# Patient Record
Sex: Female | Born: 1975 | Race: White | Hispanic: No | Marital: Married | Smoking: Never smoker
Health system: Southern US, Community
[De-identification: ages and names within clinical notes are randomized; demographics above are authoritative.]

## PROBLEM LIST (undated history)

## (undated) DIAGNOSIS — Z8051 Family history of malignant neoplasm of kidney: Secondary | ICD-10-CM

## (undated) DIAGNOSIS — K5190 Ulcerative colitis, unspecified, without complications: Secondary | ICD-10-CM

## (undated) DIAGNOSIS — F329 Major depressive disorder, single episode, unspecified: Secondary | ICD-10-CM

## (undated) DIAGNOSIS — N390 Urinary tract infection, site not specified: Secondary | ICD-10-CM

## (undated) DIAGNOSIS — T7840XA Allergy, unspecified, initial encounter: Secondary | ICD-10-CM

## (undated) DIAGNOSIS — F419 Anxiety disorder, unspecified: Secondary | ICD-10-CM

## (undated) DIAGNOSIS — D649 Anemia, unspecified: Secondary | ICD-10-CM

## (undated) DIAGNOSIS — Z8041 Family history of malignant neoplasm of ovary: Secondary | ICD-10-CM

## (undated) DIAGNOSIS — Z808 Family history of malignant neoplasm of other organs or systems: Secondary | ICD-10-CM

## (undated) DIAGNOSIS — E785 Hyperlipidemia, unspecified: Secondary | ICD-10-CM

## (undated) DIAGNOSIS — R519 Headache, unspecified: Secondary | ICD-10-CM

## (undated) DIAGNOSIS — R51 Headache: Secondary | ICD-10-CM

## (undated) DIAGNOSIS — F32A Depression, unspecified: Secondary | ICD-10-CM

## (undated) DIAGNOSIS — Z8489 Family history of other specified conditions: Secondary | ICD-10-CM

## (undated) DIAGNOSIS — Z803 Family history of malignant neoplasm of breast: Secondary | ICD-10-CM

## (undated) DIAGNOSIS — B019 Varicella without complication: Secondary | ICD-10-CM

## (undated) HISTORY — DX: Varicella without complication: B01.9

## (undated) HISTORY — DX: Family history of malignant neoplasm of ovary: Z80.41

## (undated) HISTORY — PX: CHOLECYSTECTOMY: SHX55

## (undated) HISTORY — DX: Anxiety disorder, unspecified: F41.9

## (undated) HISTORY — DX: Allergy, unspecified, initial encounter: T78.40XA

## (undated) HISTORY — DX: Major depressive disorder, single episode, unspecified: F32.9

## (undated) HISTORY — DX: Anemia, unspecified: D64.9

## (undated) HISTORY — DX: Depression, unspecified: F32.A

## (undated) HISTORY — DX: Hyperlipidemia, unspecified: E78.5

## (undated) HISTORY — DX: Family history of malignant neoplasm of breast: Z80.3

## (undated) HISTORY — DX: Family history of malignant neoplasm of other organs or systems: Z80.8

## (undated) HISTORY — PX: OTHER SURGICAL HISTORY: SHX169

## (undated) HISTORY — DX: Ulcerative colitis, unspecified, without complications: K51.90

## (undated) HISTORY — DX: Family history of malignant neoplasm of kidney: Z80.51

## (undated) HISTORY — DX: Urinary tract infection, site not specified: N39.0

## (undated) HISTORY — PX: SMALL INTESTINE SURGERY: SHX150

---

## 2004-09-17 HISTORY — PX: ABDOMINAL HYSTERECTOMY: SHX81

## 2011-04-18 ENCOUNTER — Encounter: Payer: Self-pay | Admitting: Family Medicine

## 2011-04-18 ENCOUNTER — Ambulatory Visit (INDEPENDENT_AMBULATORY_CARE_PROVIDER_SITE_OTHER): Payer: BC Managed Care – PPO | Admitting: Family Medicine

## 2011-04-18 VITALS — BP 130/78 | HR 72 | Temp 98.3°F | Resp 12 | Ht 64.5 in | Wt 254.0 lb

## 2011-04-18 DIAGNOSIS — E785 Hyperlipidemia, unspecified: Secondary | ICD-10-CM | POA: Insufficient documentation

## 2011-04-18 DIAGNOSIS — R609 Edema, unspecified: Secondary | ICD-10-CM

## 2011-04-18 DIAGNOSIS — F339 Major depressive disorder, recurrent, unspecified: Secondary | ICD-10-CM | POA: Insufficient documentation

## 2011-04-18 DIAGNOSIS — R600 Localized edema: Secondary | ICD-10-CM

## 2011-04-18 DIAGNOSIS — F329 Major depressive disorder, single episode, unspecified: Secondary | ICD-10-CM

## 2011-04-18 DIAGNOSIS — E669 Obesity, unspecified: Secondary | ICD-10-CM | POA: Insufficient documentation

## 2011-04-18 NOTE — Patient Instructions (Signed)
Work on weight loss Reduce sugars and starches. Consider omega 3 supplement such as Flaxseed or fish oil 2-3 gm daily.

## 2011-04-18 NOTE — Progress Notes (Signed)
  Subjective:    Patient ID: Catherine Washington, female    DOB: [birth year], 35 y.o.   MRN: [id number]  HPI New patient to establish care. Recently seen by a gynecologist and had lab work significant for elevated triglycerides 257 with mildly elevated LDL 113 and HDL 40. Past history reviewed. She's had remote history of depression but not currently treated. She's had previous hysterectomy 2006 secondary to menorrhagia. No regular medications. No known drug allergies.  Family history significant for father having type 2 diabetes heart disease with MI age 69 and hypertension. Both parents had elevated lipids.  Patient nonsmoker. She is married with 2 children. 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 No alcohol use  She's had some mild pitting edema lower legs for the past year. Worse late in the day. No dyspnea. No nonsteroidals. Does not use any compressive support garments   Review of Systems  Constitutional: Negative for chills, activity change, appetite change, fatigue and unexpected weight change.  Eyes: Negative for visual disturbance.  Respiratory: Negative for cough and shortness of breath.   Cardiovascular: Negative for chest pain.  Gastrointestinal: Negative for abdominal pain.  Genitourinary: Negative for dysuria.  Neurological: Negative for dizziness and headaches.  Hematological: Negative for adenopathy.  Psychiatric/Behavioral: Negative for dysphoric mood.       Objective:   Physical Exam  Constitutional: She is oriented to person, place, and time. She appears well-developed and well-nourished. No distress.  Neck: Neck supple. No thyromegaly present.  Cardiovascular: Normal rate, regular rhythm and normal heart sounds.   No murmur heard. Pulmonary/Chest: Effort normal and breath sounds normal. No respiratory distress. She has no wheezes. She has no rales.  Musculoskeletal: She exhibits edema.       Patient has trace pitting edema both legs  Lymphadenopathy:    She has no  cervical adenopathy.  Neurological: She is alert and oriented to person, place, and time.          Assessment & Plan:  #1 dyslipidemia with high triglyceride low HDL. Discussed weight loss, initiating exercise, sugar and starch reduction and consideration for omega-3 supplement. Consider repeat lipids in 6-12 months #2 past history of depression currently stable #3 mild pitting edema most likely venous stasis. Recent thyroid functions normal. Recent renal function normal. Consider venous compression garments and weight loss

## 2011-09-14 ENCOUNTER — Ambulatory Visit (INDEPENDENT_AMBULATORY_CARE_PROVIDER_SITE_OTHER): Payer: BC Managed Care – PPO | Admitting: *Deleted

## 2011-09-14 DIAGNOSIS — Z23 Encounter for immunization: Secondary | ICD-10-CM

## 2012-01-03 ENCOUNTER — Ambulatory Visit (INDEPENDENT_AMBULATORY_CARE_PROVIDER_SITE_OTHER): Payer: BC Managed Care – PPO | Admitting: Family Medicine

## 2012-01-03 ENCOUNTER — Encounter: Payer: Self-pay | Admitting: Family Medicine

## 2012-01-03 VITALS — BP 130/90 | Temp 98.3°F | Wt 246.0 lb

## 2012-01-03 DIAGNOSIS — IMO0002 Reserved for concepts with insufficient information to code with codable children: Secondary | ICD-10-CM

## 2012-01-03 DIAGNOSIS — Z23 Encounter for immunization: Secondary | ICD-10-CM

## 2012-01-03 MED ORDER — DOXYCYCLINE HYCLATE 100 MG PO TABS: 100.0000 mg | ORAL_TABLET | Freq: Two times a day (BID) | ORAL | Status: AC

## 2012-01-03 NOTE — Patient Instructions (Signed)
Paronychia  Paronychia is an inflammatory reaction involving the folds of the skin surrounding the fingernail. This is commonly caused by an infection in the skin around a nail. The most common cause of paronychia is frequent wetting of the hands (as seen with bartenders, food servers, nurses or others who wet their hands). This makes the skin around the fingernail susceptible to infection by bacteria (germs) or fungus. Other predisposing factors are:   Aggressive manicuring.   Nail biting.   Thumb sucking.  The most common cause is a staphylococcal (a type of germ) infection, or a fungal (Candida) infection. When caused by a germ, it usually comes on suddenly with redness, swelling, pus and is often painful. It may get under the nail and form an abscess (collection of pus), or form an abscess around the nail. If the nail itself is infected with a fungus, the treatment is usually prolonged and may require oral medicine for up to one year. Your caregiver will determine the length of time treatment is required. The paronychia caused by bacteria (germs) may largely be avoided by not pulling on hangnails or picking at cuticles. When the infection occurs at the tips of the finger it is called felon. When the cause of paronychia is from the herpes simplex virus (HSV) it is called herpetic whitlow.  TREATMENT   When an abscess is present treatment is often incision and drainage. This means that the abscess must be cut open so the pus can get out. When this is done, the following home care instructions should be followed.  HOME CARE INSTRUCTIONS    It is important to keep the affected fingers very dry. Rubber or plastic gloves over cotton gloves should be used whenever the hand must be placed in water.   Keep wound clean, dry and dressed as suggested by your caregiver between warm soaks or warm compresses.   Soak in warm water for fifteen to twenty minutes three to four times per day for bacterial infections. Fungal  infections are very difficult to treat, so often require treatment for long periods of time.   For bacterial (germ) infections take antibiotics (medicine which kill germs) as directed and finish the prescription, even if the problem appears to be solved before the medicine is gone.   Only take over-the-counter or prescription medicines for pain, discomfort, or fever as directed by your caregiver.  SEEK IMMEDIATE MEDICAL CARE IF:   You have redness, swelling, or increasing pain in the wound.   You notice pus coming from the wound.   You have a fever.   You notice a bad smell coming from the wound or dressing.  Document Released: 02/27/2001 Document Revised: 08/23/2011 Document Reviewed: 10/29/2008  [employer] Patient Information 2012 [employer].

## 2012-01-03 NOTE — Progress Notes (Signed)
  Subjective:    Patient ID: Catherine Washington, female    DOB: Nov 26, 1975, 36 y.o.   MRN: [id number]  HPI  Acute visit. Possible hangnail left middle finger last week. Over the weekend progresses swelling and some puslike drainage. Also noted some granulation tissue forming since then. She denies any fever or chills.  She does not use any artificial nails. No history of diabetes. Last tetanus unknown but she initially declines tetanus today. No history of MRSA. Allergy to Macrobid. No history of diabetes.   Review of Systems  Constitutional: Negative for fever and chills.       Objective:   Physical Exam  Constitutional: She appears well-developed and well-nourished.  Musculoskeletal:       Left middle finger reveals erythema mostly along the border next the ring finger. She has some granulation tissue along that border the nail. No obvious purulence but she does have some minimal whitish discoloration of tissue possibly related to pressure versus pus. There is no involvement of the DIP joint or PIP joint and no erythema spreading to the DIP joint. Moderately tender to palpation. Full range of motion PIP and DIP joint. No evidence for felon          Assessment & Plan:  Paronychia left middle finger. We've recommended I and D. and after reviewing risks and benefits the patient consents.  Digital block with 1% plain xylocaine.  Prepped with betadine.  Using #11 blade made small 1/2 cm incision area of fluctuance with minimal pus and this was cultured.  Minimal bleeding.  Topical antibiotic and dressing.  Start salt water soaks by tonight.  Doxycycline 100 mg po bid for 10 days. Pt did consent to tetanus.

## 2012-01-04 ENCOUNTER — Telehealth: Payer: Self-pay | Admitting: Family Medicine

## 2012-01-04 NOTE — Telephone Encounter (Signed)
I called the lab, they have what they need after all.

## 2012-01-04 NOTE — Telephone Encounter (Signed)
[facility] only rcvd a face sheet. Need to get a requisition. Pls fax to fax # [phone].

## 2012-06-23 ENCOUNTER — Ambulatory Visit (INDEPENDENT_AMBULATORY_CARE_PROVIDER_SITE_OTHER): Payer: BC Managed Care – PPO | Admitting: Internal Medicine

## 2012-06-23 ENCOUNTER — Encounter: Payer: Self-pay | Admitting: Internal Medicine

## 2012-06-23 ENCOUNTER — Telehealth: Payer: Self-pay | Admitting: Family Medicine

## 2012-06-23 VITALS — BP 130/84 | HR 98 | Temp 98.6°F | Wt 236.0 lb

## 2012-06-23 DIAGNOSIS — R52 Pain, unspecified: Secondary | ICD-10-CM

## 2012-06-23 DIAGNOSIS — R109 Unspecified abdominal pain: Secondary | ICD-10-CM

## 2012-06-23 DIAGNOSIS — R1110 Vomiting, unspecified: Secondary | ICD-10-CM

## 2012-06-23 DIAGNOSIS — R1013 Epigastric pain: Secondary | ICD-10-CM

## 2012-06-23 LAB — POCT URINALYSIS DIPSTICK
Bilirubin, UA: NEGATIVE
Blood, UA: NEGATIVE
Glucose, UA: NEGATIVE
Spec Grav, UA: <=1.005

## 2012-06-23 MED ORDER — ONDANSETRON 4 MG PO TBDP: 4.0000 mg | ORAL_TABLET | Freq: Three times a day (TID) | ORAL | Status: DC | PRN

## 2012-06-23 MED ORDER — PROMETHAZINE HCL 25 MG/ML IJ SOLN
25.0000 mg | Freq: Once | INTRAMUSCULAR | Status: AC
  Administered 2012-06-23: 25 mg via INTRAVENOUS

## 2012-06-23 NOTE — Telephone Encounter (Signed)
Patient calling.  She started with  severe upper abdominal pain, radiating into her back and chest area.  Started vomiting at 1030.   She rated the pain 7/10.  Took OTC meds with minimal relief.   Sx started with a fullness in her abdominal area on the way home this morning and by the time she got home she was in pain.  No fever.   LMP -hysterectomy at age 36.   Needs to be seen immediately per Abdominal Pain protocal.  Scheduled for 330p with Dr. Fabian Sharp.

## 2012-06-23 NOTE — Progress Notes (Signed)
  Subjective:    Patient ID: Catherine Washington, female    DOB: 1976-05-02, 36 y.o.   MRN: [id number]  HPI Patient comes in today for SDA for  new problem evaluation. pcp NA here with husband driver Was in her usual state of health until about 6-8 hours ago when she had the acute onset of high epigastric pain that radiated to her back and some up into her chest. This then began to be associated with vomiting and dry heaves.  She took famotidine Pepto-Bismol Maalox other over-the-counter antacids without significant help. Since that time it has not improved she calls it a 7 out of10 pain  Level. The pain is constant but worse after trying to eat. Denies any respiratory symptoms worse with inspiration change in bowel habits otherwise blood in the stool. She has a history of number weeks ago of a mild symptom like this the responded to famotidine over-the-counter. But not this bad. She's had a hysterectomy. She's on no medicines chronically for chronic disease state. No history of kidney stones diagnosed gallbladder disease problem with pregnancy except she vomited through the pregnancies 15 years ago. There is a family history of ulcer disease in the father and he also had gallbladder disease. Review of Systems Negative for chest pain shortness of breath syncope fever chills UTI symptoms hematuria. No significant recent Advil Aleve for pain medicines gets occasional hip pain. No hx of sig etoh.  Past history family history social history reviewed in the electronic medical record.    Objective:   Physical Exam BP 130/84  Pulse 98  Temp 98.6 F (37 C) (Oral)  Wt 236 lb (107.049 kg)  SpO2 98%  WDWN in nad while sitting serious  affect non toxic. Quiet breathing. No diaphoresis Neck: Supple without adenopathy or masses or bruits Chest:  Clear to A&P without wheezes rales or rhonchi CV:  S1-S2 no gallops or murmurs peripheral perfusion is normal Abdomen:  BS present  without hepatosplenomegaly,  no  CVA tenderness  Tender RUQ on palpation no rebound also epigastrium no rebound .  No lower q pain  No clubbing cyanosis or edema Skin colo non icteric  UA normal     Assessment & Plan:  Acute epigastric right upper  Pain  with nausea and vomiting. Radiation to the back.  Differential diagnosis discussed suspect biliary disease because of the right upper quadrant tenderness on exam. However still could have acid peptic disease also.  She has no other systemic symptoms it is the end of the clinic today and unable to get an emergency ultrasound. Discussed hydration is important she may end up needing to be seen in the emergency room tonight. IM Phenergan 25 mg samples of Prilosec to take 1 by mouth daily as well as Zofran as needed for nausea. Stay on clear liquids we will order ultrasound for tomorrow.  And also lab work at that time to avoid repetition   if she ends up having to go to the emergency room tonight.   Discussed situation with her primary care doctor today.

## 2012-06-23 NOTE — Patient Instructions (Addendum)
I suspect this is possible gallbladder pain and if you're unable to tolerate liquids and the pain did not subside I recommend emergency room care and evaluation. Ulcer acid disease is also possible but usually doesn't come on this quickly.   In the meantime we will order an abdominal ultrasound for as soon as possible and plan lab work also.  Clear liquids only add anti-acid medication as given.  Can try Zofran for nausea but this won't really help pain.

## 2012-06-24 ENCOUNTER — Other Ambulatory Visit: Payer: Self-pay | Admitting: Family Medicine

## 2012-06-24 ENCOUNTER — Telehealth: Payer: Self-pay | Admitting: Family Medicine

## 2012-06-24 ENCOUNTER — Other Ambulatory Visit (INDEPENDENT_AMBULATORY_CARE_PROVIDER_SITE_OTHER): Payer: BC Managed Care – PPO

## 2012-06-24 ENCOUNTER — Ambulatory Visit
Admission: RE | Admit: 2012-06-24 | Discharge: 2012-06-24 | Disposition: A | Discharge status: Home / Self Care | Payer: BC Managed Care – PPO | Source: Ambulatory Visit | Attending: Internal Medicine | Admitting: Internal Medicine

## 2012-06-24 DIAGNOSIS — R52 Pain, unspecified: Secondary | ICD-10-CM

## 2012-06-24 DIAGNOSIS — R1013 Epigastric pain: Secondary | ICD-10-CM

## 2012-06-24 DIAGNOSIS — R1110 Vomiting, unspecified: Secondary | ICD-10-CM

## 2012-06-24 DIAGNOSIS — K819 Cholecystitis, unspecified: Secondary | ICD-10-CM

## 2012-06-24 DIAGNOSIS — R109 Unspecified abdominal pain: Secondary | ICD-10-CM

## 2012-06-24 DIAGNOSIS — K8020 Calculus of gallbladder without cholecystitis without obstruction: Secondary | ICD-10-CM

## 2012-06-24 LAB — CBC WITH DIFFERENTIAL/PLATELET
Basophils Relative: 0.5 % (ref 0.0–3.0)
Eosinophils Absolute: 0.3 10*3/uL (ref 0.0–0.7)
HCT: 38.8 % (ref 36.0–46.0)
Lymphs Abs: 2.9 10*3/uL (ref 0.7–4.0)
MCHC: 32.5 g/dL (ref 30.0–36.0)
MCV: 84.6 fl (ref 78.0–100.0)
Monocytes Absolute: 0.6 10*3/uL (ref 0.1–1.0)
Neutrophils Relative %: 55.5 % (ref 43.0–77.0)
RBC: 4.58 Mil/uL (ref 3.87–5.11)

## 2012-06-24 LAB — BASIC METABOLIC PANEL
Calcium: 9 mg/dL (ref 8.4–10.5)
GFR: 80.52 mL/min (ref >60.00)
Glucose, Bld: 88 mg/dL (ref 70–99)
Potassium: 4.1 mEq/L (ref 3.5–5.1)
Sodium: 139 mEq/L (ref 135–145)

## 2012-06-24 LAB — HEPATIC FUNCTION PANEL
ALT: 15 U/L (ref 0–35)
AST: 17 U/L (ref 0–37)
Albumin: 3.9 g/dL (ref 3.5–5.2)
Total Bilirubin: 0.7 mg/dL (ref 0.3–1.2)
Total Protein: 7.4 g/dL (ref 6.0–8.3)

## 2012-06-24 LAB — AMYLASE: Amylase: 46 U/L (ref 27–131)

## 2012-06-24 NOTE — Telephone Encounter (Signed)
Spoke to the pt and informed her of surgery referral.

## 2012-06-24 NOTE — Telephone Encounter (Signed)
Do you have any further suggestions?  Please advise.  Thanks!!

## 2012-06-24 NOTE — Telephone Encounter (Signed)
Pt had abdominal ultrasound.  Results are contracted gallbladder with no mobile stones in the neck and fundus marked with wall thickening compatible with cholecystitis.  Likely chronic given the lack of sonographic murphy sign.  Dr. Fabian Sharp has made referral for surgery.  I have placed the order in the system.  I have tried reaching the pt by telephone.  Left a message for her to return my call.

## 2012-06-24 NOTE — Telephone Encounter (Signed)
No. Needs to see surgeon as soon as possible.  We will need to call if she does not get appt in next 1-2 days.

## 2012-06-25 ENCOUNTER — Telehealth (INDEPENDENT_AMBULATORY_CARE_PROVIDER_SITE_OTHER): Payer: Self-pay

## 2012-06-25 NOTE — Telephone Encounter (Signed)
Please see if you can call to make appt in next few days per Dr. Caryl Never.  He would like her there in 1-2.

## 2012-06-25 NOTE — Telephone Encounter (Signed)
The patient called to see if she can get her appointment moved up.  She is having some pain and nausea.  I looked at everyone's schedule and I don't see an appointment sooner than her appointment on 10/15.  I advised to stay on a bland, low fat diet and call back in the morning so we can look for cancellations.

## 2012-06-26 ENCOUNTER — Telehealth (INDEPENDENT_AMBULATORY_CARE_PROVIDER_SITE_OTHER): Payer: Self-pay

## 2012-06-26 NOTE — Telephone Encounter (Signed)
Received a call from Dr Caryl Never re: pts appt and concern with weekend coming. He states his office was told it may be a couple of months before pt could be seen. I looked at pts appt in system and  pts appt is on 07-01-12.  I advised him I will Have MD on call review pts labs and u/s result for recommendations. Labs and U/S reviewed by Dr. Abbey Chatters. Per Dr. Abbey Chatters labs are good and u/s shows chronic problem. Does not appear to be acute.  He asked me to call pt and if symptoms resolving an appt for 07-01-12 should be acceptable. I called pt and she states she is improving. Pt advised to eat low fat low residue diet until we see her Tuesday. Pt also advised if she has increased abd pain,fever and or uncontrolled vomiting she is to to to ER. Pt states she understands and this plan is acceptable to her.

## 2012-06-26 NOTE — Telephone Encounter (Signed)
Called [facility] and spoke to triage nurse told me a lot of the doctors are out and did not see any openings but is going to have her site manager look over the schedule and call me back.

## 2012-07-01 ENCOUNTER — Ambulatory Visit (INDEPENDENT_AMBULATORY_CARE_PROVIDER_SITE_OTHER): Payer: BC Managed Care – PPO | Admitting: General Surgery

## 2012-07-01 ENCOUNTER — Encounter (INDEPENDENT_AMBULATORY_CARE_PROVIDER_SITE_OTHER): Payer: Self-pay | Admitting: General Surgery

## 2012-07-01 VITALS — BP 130/82 | HR 79 | Temp 98.0°F | Resp 18 | Ht 64.0 in | Wt 232.2 lb

## 2012-07-01 DIAGNOSIS — K8066 Calculus of gallbladder and bile duct with acute and chronic cholecystitis without obstruction: Secondary | ICD-10-CM

## 2012-07-01 DIAGNOSIS — K8010 Calculus of gallbladder with chronic cholecystitis without obstruction: Secondary | ICD-10-CM | POA: Insufficient documentation

## 2012-07-01 NOTE — Progress Notes (Signed)
Patient ID: Catherine Washington, female   DOB: 07/02/1976, 36 y.o.   MRN: [id number]  Chief Complaint  Patient presents with  . New Evaluation    Chole    HPI Catherine Washington is a 36 y.o. female.  She is referred by Dr. Berniece Andreas for evaluation of symptomatic gallstones. Dr. Evelena Peat is her primary care physician.  Over the past 2 months this woman has had 3 episodes of postprandial burning epigastric pain radiating to her back. With each episode she has nausea and vomiting. The first episode lasted about 3 hours but the last episode lasted 12 hours and she went for evaluation. No alteration in bowel movements. No fever or chills. No past history of liver disease, cardiovascular disease or pulmonary problems.  Gallbladder ultrasound on 06/24/2012 shows a contracted gallbladder with non-mobile stones, significant wall thickening but no Murphy's sign. Lab work including CBC, complete metabolic panel, and lipase are normal.  Her father had his gallbladder out.  Patient's past medical history is significant for obesity, depression, hyperlipidemia, and a supracervical laparoscopic hysterectomy. Both ovaries remain in place.  She is a Nurse, learning disability at [employer]. Her husband is a Doctor, hospital G. Has 2 sons. Denies tobacco or alcohol. HPI  Past Medical History  Diagnosis Date  . Chicken pox   . Depression   . Allergy   . Hyperlipidemia   . UTI (urinary tract infection)     Past Surgical History  Procedure Date  . Abdominal hysterectomy 2006  . Childbirth 1995, 2003    Family History  Problem Relation Age of Onset  . Hyperlipidemia Mother   . Mental illness Mother   . Hyperlipidemia Father   . Heart disease Father   . Hypertension Father   . Diabetes Father   . Cancer Maternal Aunt     breast  . Diabetes Maternal Grandmother   . Cancer Paternal Grandmother     ovarian, lung  . Stroke Paternal Grandmother   . Diabetes Paternal Grandfather     Social History History    Substance Use Topics  . Smoking status: Never Smoker   . Smokeless tobacco: Not on file  . Alcohol Use: Not on file    Allergies  Allergen Reactions  . Nitrofurantoin Monohyd Macro     GI upset    Current Outpatient Prescriptions  Medication Sig Dispense Refill  . ondansetron (ZOFRAN-ODT) 4 MG disintegrating tablet Take 1 tablet (4 mg total) by mouth every 8 (eight) hours as needed for nausea.  20 tablet  0    Review of Systems Review of Systems  Constitutional: Negative for fever, chills and unexpected weight change.  HENT: Negative for hearing loss, congestion, sore throat, trouble swallowing and voice change.   Eyes: Negative for visual disturbance.  Respiratory: Negative for cough and wheezing.   Cardiovascular: Negative for chest pain, palpitations and leg swelling.  Gastrointestinal: Positive for vomiting and abdominal pain. Negative for nausea, diarrhea, constipation, blood in stool, abdominal distention and anal bleeding.  Genitourinary: Negative for hematuria, vaginal bleeding and difficulty urinating.  Musculoskeletal: Negative for arthralgias.  Skin: Negative for rash and wound.  Neurological: Negative for seizures, syncope and headaches.  Hematological: Negative for adenopathy. Does not bruise/bleed easily.  Psychiatric/Behavioral: Negative for confusion.    Blood pressure 130/82, pulse 79, temperature 98 F (36.7 C), temperature source Oral, resp. rate 18, height 5\' 4"  (1.626 m), weight 232 lb 3.2 oz (105.325 kg).  Physical Exam Physical Exam  Constitutional: She is  oriented to person, place, and time. She appears well-developed and well-nourished. No distress.       Weight 232.  HENT:  Head: Normocephalic and atraumatic.  Nose: Nose normal.  Mouth/Throat: No oropharyngeal exudate.  Eyes: Conjunctivae normal and EOM are normal. Pupils are equal, round, and reactive to light. Left eye exhibits no discharge. No scleral icterus.  Neck: Neck supple. No JVD  present. No tracheal deviation present. No thyromegaly present.  Cardiovascular: Normal rate, regular rhythm, normal heart sounds and intact distal pulses.   No murmur heard. Pulmonary/Chest: Effort normal and breath sounds normal. No respiratory distress. She has no wheezes. She has no rales. She exhibits no tenderness.  Abdominal: Soft. Bowel sounds are normal. She exhibits no distension and no mass. There is no tenderness. There is no rebound and no guarding.       Laparoscopy scar lower umbilical rim. No hernia.  Musculoskeletal: She exhibits no edema and no tenderness.  Lymphadenopathy:    She has no cervical adenopathy.  Neurological: She is alert and oriented to person, place, and time. She exhibits normal muscle tone. Coordination normal.  Skin: Skin is warm. No rash noted. She is not diaphoretic. No erythema. No pallor.  Psychiatric: She has a normal mood and affect. Her behavior is normal. Judgment and thought content normal.    Data Reviewed Ultrasound report. Laboratory values. Dr. Rosezella Florida  office note.  Assessment    Chronic cholecystitis with cholelithiasis, now with recurrent attacks of biliary colic.  Obesity  History laparoscopic supracervical hysterectomy    Plan    We'll schedule for laparoscopic cholecystectomy with cholangiogram.  I discussed the indications, details, techniques, and numerous risks of the surgery with the patient. She has good understanding because of her medical background. Her questions were answered. She understands these issues. She agrees with this plan.       Angelia Mould. Derrell Lolling, M.D., [facility] General and Minimally invasive Surgery Breast and Colorectal Surgery Office:   [phone] Pager:   [phone]  07/01/2012, 4:42 PM

## 2012-07-01 NOTE — Patient Instructions (Signed)
Your attacks of upper abdominal pain or almost certainly due to your gallstones and gallbladder disease.  I advised her to follow a very low-fat diet until you have your gallbladder surgery.  He will be scheduled for laparoscopic cholecystectomy with cholangiogram in the near future.   Laparoscopic Cholecystectomy Laparoscopic cholecystectomy is surgery to remove the gallbladder. The gallbladder is located slightly to the right of center in the abdomen, behind the liver. It is a concentrating and storage sac for the bile produced in the liver. Bile aids in the digestion and absorption of fats. Gallbladder disease (cholecystitis) is an inflammation of your gallbladder. This condition is usually caused by a buildup of gallstones (cholelithiasis) in your gallbladder. Gallstones can block the flow of bile, resulting in inflammation and pain. In severe cases, emergency surgery may be required. When emergency surgery is not required, you will have time to prepare for the procedure. Laparoscopic surgery is an alternative to open surgery. Laparoscopic surgery usually has a shorter recovery time. Your common bile duct may also need to be examined and explored. Your caregiver will discuss this with you if he or she feels this should be done. If stones are found in the common bile duct, they may be removed. LET YOUR CAREGIVER KNOW ABOUT:  Allergies to food or medicine.  Medicines taken, including vitamins, herbs, eyedrops, over-the-counter medicines, and creams.  Use of steroids (by mouth or creams).  Previous problems with anesthetics or numbing medicines.  History of bleeding problems or blood clots.  Previous surgery.  Other health problems, including diabetes and kidney problems.  Possibility of pregnancy, if this applies. RISKS AND COMPLICATIONS All surgery is associated with risks. Some problems that may occur following this procedure include:  Infection.  Damage to the common bile duct,  nerves, arteries, veins, or other internal organs such as the stomach or intestines.  Bleeding.  A stone may remain in the common bile duct. BEFORE THE PROCEDURE  Do not take aspirin for 3 days prior to surgery or blood thinners for 1 week prior to surgery.  Do not eat or drink anything after midnight the night before surgery.  Let your caregiver know if you develop a cold or other infectious problem prior to surgery.  You should be present 60 minutes before the procedure or as directed. PROCEDURE  You will be given medicine that makes you sleep (general anesthetic). When you are asleep, your surgeon will make several small cuts (incisions) in your abdomen. One of these incisions is used to insert a small, lighted scope (laparoscope) into the abdomen. The laparoscope helps the surgeon see into your abdomen. Carbon dioxide gas will be pumped into your abdomen. The gas allows more room for the surgeon to perform your surgery. Other operating instruments are inserted through the other incisions. Laparoscopic procedures may not be appropriate when:  There is major scarring from previous surgery.  The gallbladder is extremely inflamed.  There are bleeding disorders or unexpected cirrhosis of the liver.  A pregnancy is near term.  Other conditions make the laparoscopic procedure impossible. If your surgeon feels it is not safe to continue with a laparoscopic procedure, he or she will perform an open abdominal procedure. In this case, the surgeon will make an incision to open the abdomen. This gives the surgeon a larger view and field to work within. This may allow the surgeon to perform procedures that sometimes cannot be performed with a laparoscope alone. Open surgery has a longer recovery time. AFTER THE PROCEDURE  You will be taken to the recovery area where a nurse will watch and check your progress.  You may be allowed to go home the same day.  Do not resume physical activities  until directed by your caregiver.  You may resume a normal diet and activities as directed. Document Released: 09/03/2005 Document Revised: 11/26/2011 Document Reviewed: 02/16/2011 [facility] Patient Information 2013 [place], [place].

## 2012-07-08 ENCOUNTER — Ambulatory Visit (INDEPENDENT_AMBULATORY_CARE_PROVIDER_SITE_OTHER): Payer: BC Managed Care – PPO | Admitting: Surgery

## 2012-07-23 ENCOUNTER — Other Ambulatory Visit (INDEPENDENT_AMBULATORY_CARE_PROVIDER_SITE_OTHER): Payer: Self-pay | Admitting: General Surgery

## 2012-07-23 DIAGNOSIS — K8010 Calculus of gallbladder with chronic cholecystitis without obstruction: Secondary | ICD-10-CM

## 2012-07-28 ENCOUNTER — Telehealth (INDEPENDENT_AMBULATORY_CARE_PROVIDER_SITE_OTHER): Payer: Self-pay | Admitting: General Surgery

## 2012-07-28 ENCOUNTER — Telehealth (INDEPENDENT_AMBULATORY_CARE_PROVIDER_SITE_OTHER): Payer: Self-pay

## 2012-07-28 NOTE — Telephone Encounter (Signed)
Called patient to advise of post op appointment to see Dr. Derrell Lolling based on lap chole on 07/23/12. Patient offered to come in on 08/06/12 at 5:00, patient stated she has to be at work that day at 5:15 and would not be able to make the appointment. Advised patient another day and time will be found for her to be seen and I would get back to her. Patient agreed.

## 2012-07-28 NOTE — Telephone Encounter (Signed)
Pt had cholecystectomy on 07/23/12.  Please call her with a post op appointment - unable to find one.

## 2012-07-31 ENCOUNTER — Telehealth (INDEPENDENT_AMBULATORY_CARE_PROVIDER_SITE_OTHER): Payer: Self-pay | Admitting: General Surgery

## 2012-07-31 NOTE — Telephone Encounter (Signed)
Called patient to advise of post op appointment set to see Dr. Derrell Lolling on 08/21/12 at 12:45. Patient stated the area does not show any sign of infection, it is still a little sore. Advised the patient to be careful not to lift anything heavy since the surgery was last week. Advised patient that she may want to put a large band-aid over the site to make sure her clothes do not rub against the incision. Patient agreed.

## 2012-08-06 ENCOUNTER — Encounter (INDEPENDENT_AMBULATORY_CARE_PROVIDER_SITE_OTHER): Payer: BC Managed Care – PPO | Admitting: General Surgery

## 2012-08-21 ENCOUNTER — Encounter (INDEPENDENT_AMBULATORY_CARE_PROVIDER_SITE_OTHER): Payer: Self-pay | Admitting: General Surgery

## 2012-08-21 ENCOUNTER — Ambulatory Visit (INDEPENDENT_AMBULATORY_CARE_PROVIDER_SITE_OTHER): Payer: BC Managed Care – PPO | Admitting: General Surgery

## 2012-08-21 VITALS — BP 132/84 | HR 80 | Temp 98.1°F | Resp 14 | Ht 64.0 in | Wt 228.6 lb

## 2012-08-21 DIAGNOSIS — K8010 Calculus of gallbladder with chronic cholecystitis without obstruction: Secondary | ICD-10-CM

## 2012-08-21 DIAGNOSIS — K8066 Calculus of gallbladder and bile duct with acute and chronic cholecystitis without obstruction: Secondary | ICD-10-CM

## 2012-08-21 DIAGNOSIS — R52 Pain, unspecified: Secondary | ICD-10-CM

## 2012-08-21 DIAGNOSIS — R1013 Epigastric pain: Secondary | ICD-10-CM

## 2012-08-21 NOTE — Patient Instructions (Signed)
You appear to be recovering uneventfully following your cholecystectomy.  I advise a low fat diet for about 6-8 weeks, then you can slowly advance your diet..  Return to see Dr. Derrell Lolling if further problems arise.

## 2012-08-21 NOTE — Progress Notes (Signed)
Patient ID: Catherine Washington, female   DOB: Nov 23, 1975, 36 y.o.   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  She underwent elective laparoscopic cholecystectomy with cholangiogram on November 13. The cholangiogram was normal. Final pathology shows chronic cholecystitis with cholelithiasis. 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 She is somewhat intolerant of fatty foods it it will cause cramps and diarrhea. If she avoids fatty food she has no digestive problems and has solid bowel movements.  Exam: Patient looks well. No distress. Abdomen soft. Nontender. Nondistended. Wounds healing uneventfully without wound complications  Assessment: Chronic cholecystitis with cholelithiasis, recovering uneventfully following laparoscopic cholecystectomy with cholangiogram Fatty food intolerance, not unusual following cholecystectomy  Plan: Avoid fatty foods for 6-8 weeks then slowly advance diet as tolerated Resume normal physical activities, no restriction Return to see me if further problems arise.    Angelia Mould. Derrell Lolling, M.D., [facility] General and Minimally invasive Surgery Breast and Colorectal Surgery Office:   [phone] Pager:   [phone]

## 2012-12-02 ENCOUNTER — Ambulatory Visit (INDEPENDENT_AMBULATORY_CARE_PROVIDER_SITE_OTHER): Payer: BC Managed Care – PPO | Admitting: Family Medicine

## 2012-12-02 ENCOUNTER — Encounter: Payer: Self-pay | Admitting: Family Medicine

## 2012-12-02 VITALS — BP 130/80 | HR 93 | Temp 97.9°F | Wt 230.0 lb

## 2012-12-02 DIAGNOSIS — J019 Acute sinusitis, unspecified: Secondary | ICD-10-CM

## 2012-12-02 MED ORDER — AZITHROMYCIN 250 MG PO TABS: ORAL_TABLET | ORAL | Status: DC

## 2012-12-02 NOTE — Progress Notes (Signed)
  Subjective:    Patient ID: Catherine Washington, female    DOB: July 18, 1976, 37 y.o.   MRN: [id number]  HPI Here for the onset of right ear pain and sinus congestion last night. No fever or St or HA or cough. On Tylenol and Sudafed.    Review of Systems  Constitutional: Negative.   HENT: Positive for ear pain and congestion. Negative for hearing loss, neck pain, neck stiffness and postnasal drip.   Eyes: Negative.   Respiratory: Negative.        Objective:   Physical Exam  Constitutional: She appears well-developed and well-nourished.  HENT:  Right Ear: External ear normal.  Left Ear: External ear normal.  Nose: Nose normal.  Mouth/Throat: Oropharynx is clear and moist.  TMJs are not tender   Eyes: Conjunctivae are normal.  Neck: No thyromegaly present.  Pulmonary/Chest: Effort normal and breath sounds normal.  Lymphadenopathy:    She has no cervical adenopathy.          Assessment & Plan:  Probable early sinusitis. Add Mucinex D

## 2013-01-05 ENCOUNTER — Other Ambulatory Visit: Payer: Self-pay | Admitting: Obstetrics and Gynecology

## 2013-06-23 ENCOUNTER — Other Ambulatory Visit (INDEPENDENT_AMBULATORY_CARE_PROVIDER_SITE_OTHER): Payer: BC Managed Care – PPO

## 2013-06-23 DIAGNOSIS — Z0000 Encounter for general adult medical examination without abnormal findings: Secondary | ICD-10-CM

## 2013-06-23 LAB — CBC WITH DIFFERENTIAL/PLATELET
Basophils Absolute: 0 10*3/uL (ref 0.0–0.1)
Eosinophils Absolute: 0.3 10*3/uL (ref 0.0–0.7)
HCT: 38.1 % (ref 36.0–46.0)
Hemoglobin: 12.8 g/dL (ref 12.0–15.0)
Lymphs Abs: 3 10*3/uL (ref 0.7–4.0)
MCHC: 33.6 g/dL (ref 30.0–36.0)
MCV: 83.1 fl (ref 78.0–100.0)
Monocytes Absolute: 0.5 10*3/uL (ref 0.1–1.0)
Neutro Abs: 4.6 10*3/uL (ref 1.4–7.7)
Platelets: 440 10*3/uL — ABNORMAL HIGH (ref 150.0–400.0)
RDW: 14.1 % (ref 11.5–14.6)

## 2013-06-23 LAB — POCT URINALYSIS DIPSTICK
Bilirubin, UA: NEGATIVE
Blood, UA: NEGATIVE
Glucose, UA: NEGATIVE
Nitrite, UA: NEGATIVE
Protein, UA: NEGATIVE
Spec Grav, UA: 1.025
Urobilinogen, UA: 0.2

## 2013-06-23 LAB — BASIC METABOLIC PANEL
BUN: 6 mg/dL (ref 6–23)
CO2: 26 mEq/L (ref 19–32)
GFR: 84.65 mL/min (ref >60.00)
Glucose, Bld: 92 mg/dL (ref 70–99)
Potassium: 4.4 mEq/L (ref 3.5–5.1)
Sodium: 138 mEq/L (ref 135–145)

## 2013-06-23 LAB — HEPATIC FUNCTION PANEL
ALT: 13 U/L (ref 0–35)
AST: 17 U/L (ref 0–37)
Albumin: 4 g/dL (ref 3.5–5.2)
Total Bilirubin: 0.3 mg/dL (ref 0.3–1.2)
Total Protein: 7 g/dL (ref 6.0–8.3)

## 2013-06-23 LAB — LIPID PANEL
Cholesterol: 174 mg/dL (ref 0–200)
VLDL: 35.4 mg/dL (ref 0.0–40.0)

## 2013-06-29 ENCOUNTER — Encounter: Payer: Self-pay | Admitting: Family Medicine

## 2013-06-29 ENCOUNTER — Ambulatory Visit (INDEPENDENT_AMBULATORY_CARE_PROVIDER_SITE_OTHER): Payer: BC Managed Care – PPO | Admitting: Family Medicine

## 2013-06-29 VITALS — BP 130/80 | HR 105 | Temp 98.2°F | Wt 233.0 lb

## 2013-06-29 DIAGNOSIS — Z0000 Encounter for general adult medical examination without abnormal findings: Secondary | ICD-10-CM

## 2013-06-29 DIAGNOSIS — E669 Obesity, unspecified: Secondary | ICD-10-CM | POA: Insufficient documentation

## 2013-06-29 MED ORDER — BUPROPION HCL ER (XL) 300 MG PO TB24: 300.0000 mg | ORAL_TABLET | Freq: Every day | ORAL | Status: DC

## 2013-06-29 MED ORDER — CHOLESTYRAMINE 4 G PO PACK: 1.0000 | PACK | Freq: Two times a day (BID) | ORAL | Status: DC

## 2013-06-29 NOTE — Progress Notes (Signed)
  Subjective:    Patient ID: Catherine Washington, female    DOB: 13-Mar-1976, 37 y.o.   MRN: [id number]  HPI Patient seen for physical. She sees gynecologist and was seen recently. She's had previous hysterectomy for dysfunctional bleeding. Has past history of depression. She lost her father to heart disease last summer and this has been difficult. Her major complaint is excessive fatigue. She works at [employer] 2 12 hour shifts per week and these are 3rd shift. She thinks this (work schedule) interferes some with sleep. She does have some depressive symptoms since loss of father. No suicidal ideation. She has some anhedonia and early-morning awakening.  Patient also relates some persistent diarrhea following cholecystectomy over year ago. She tries to avoid high fat foods. No consistent exercise. Nonsmoker. No alcohol use.  Past Medical History  Diagnosis Date  . Chicken pox   . Depression   . Allergy   . Hyperlipidemia   . UTI (urinary tract infection)    Past Surgical History  Procedure Laterality Date  . Childbirth  1995, 2003  . Cholecystectomy    . Abdominal hysterectomy  2006    dysfunctional bleeding    reports that she has never smoked. She has never used smokeless tobacco. She reports that she does not drink alcohol or use illicit drugs. family history includes Cancer in her maternal aunt and paternal grandmother; Diabetes in her father, maternal grandmother, and paternal grandfather; Heart disease (age of onset: 40) in her father; Hyperlipidemia in her father and mother; Hypertension in her father; Mental illness in her mother; Stroke in her paternal grandmother. Allergies  Allergen Reactions  . Nitrofurantoin Monohyd Macro     GI upset      Review of Systems  Constitutional: Positive for fatigue. Negative for fever, chills, appetite change and unexpected weight change.  Eyes: Negative for visual disturbance.  Respiratory: Negative for cough and shortness of breath.    Cardiovascular: Negative for chest pain and leg swelling.  Gastrointestinal: Negative for abdominal pain.  Endocrine: Negative for polydipsia and polyuria.  Genitourinary: Negative for dysuria.  Neurological: Negative for dizziness, seizures and syncope.  Psychiatric/Behavioral: Positive for sleep disturbance and dysphoric mood. Negative for suicidal ideas and confusion.       Objective:   Physical Exam  Constitutional: She is oriented to person, place, and time. She appears well-developed and well-nourished.  Neck: Neck supple. No thyromegaly present.  Cardiovascular: Normal rate and regular rhythm.   Pulmonary/Chest: Effort normal and breath sounds normal. No respiratory distress. She has no wheezes. She has no rales.  Musculoskeletal: She exhibits no edema.  Neurological: She is alert and oriented to person, place, and time. No cranial nerve deficit.  Psychiatric:  Patient is tearful off and on. Somewhat flat affect and depressed mood          Assessment & Plan:  Complete physical. Labs reviewed with no major concerns. She does have some mildly elevated triglycerides and low HDL. We suggest more consistent exercise. Regarding her diarrhea try Questran once or twice daily as needed.    She has adjustment disorder with depressed mood.  Start Wellbutrin XL 300 mg once daily and reassess in one month.

## 2013-06-29 NOTE — Patient Instructions (Signed)
Schedule follow up in one month. Try the Questran (for diarrhea) once daily initially and may increase to twice daily as needed.

## 2013-07-30 ENCOUNTER — Encounter: Payer: Self-pay | Admitting: Family Medicine

## 2013-07-30 ENCOUNTER — Ambulatory Visit (INDEPENDENT_AMBULATORY_CARE_PROVIDER_SITE_OTHER): Payer: BC Managed Care – PPO | Admitting: Family Medicine

## 2013-07-30 VITALS — BP 130/80 | HR 101 | Temp 97.9°F | Wt 222.0 lb

## 2013-07-30 DIAGNOSIS — F329 Major depressive disorder, single episode, unspecified: Secondary | ICD-10-CM

## 2013-07-30 NOTE — Progress Notes (Signed)
  Subjective:    Patient ID: Catherine Washington, female    DOB: August 11, 1976, 37 y.o.   MRN: [id number]  HPI Patient seen for followup regarding depression. We started Wellbutrin and she has tolerated this without any side effects. She has noticed increased energy and fewer crying spells. She still has some sleep difficulties. Overall she is pleased with this. She has lost some weight since last visit and she attributes some of this to her efforts. She is having less anhedonia. No suicidal ideation. Continues to work full time.  Past Medical History  Diagnosis Date  . Chicken pox   . Depression   . Allergy   . Hyperlipidemia   . UTI (urinary tract infection)    Past Surgical History  Procedure Laterality Date  . Childbirth  1995, 2003  . Cholecystectomy    . Abdominal hysterectomy  2006    dysfunctional bleeding    reports that she has never smoked. She has never used smokeless tobacco. She reports that she does not drink alcohol or use illicit drugs. family history includes Cancer in her maternal aunt and paternal grandmother; Diabetes in her father, maternal grandmother, and paternal grandfather; Heart disease (age of onset: 32) in her father; Hyperlipidemia in her father and mother; Hypertension in her father; Mental illness in her mother; Stroke in her paternal grandmother. Allergies  Allergen Reactions  . Nitrofurantoin Monohyd Macro     GI upset      Review of Systems  Constitutional: Negative for fatigue.  Eyes: Negative for visual disturbance.  Respiratory: Negative for cough, chest tightness, shortness of breath and wheezing.   Cardiovascular: Negative for chest pain, palpitations and leg swelling.  Neurological: Negative for dizziness, seizures, syncope, weakness, light-headedness and headaches.  Psychiatric/Behavioral: Positive for sleep disturbance and dysphoric mood. Negative for suicidal ideas.       Objective:   Physical Exam  Constitutional: She appears  well-developed and well-nourished.  Cardiovascular: Normal rate and regular rhythm.   Pulmonary/Chest: Effort normal and breath sounds normal. No respiratory distress. She has no wheezes. She has no rales.  Psychiatric: She has a normal mood and affect. Her behavior is normal. Judgment and thought content normal.          Assessment & Plan:  Maj. depressive episode. improved. Continue Wellbutrin XL 300 mg daily. We've offered counseling. We discussed possible addition of low-dose SSRI medication if she continues to struggle with depression issues over the next couple of months.

## 2013-07-30 NOTE — Progress Notes (Signed)
Pre visit review using our clinic review tool, if applicable. No additional management support is needed unless otherwise documented below in the visit note. 

## 2013-12-14 ENCOUNTER — Encounter: Payer: Self-pay | Admitting: Family Medicine

## 2013-12-14 ENCOUNTER — Ambulatory Visit (INDEPENDENT_AMBULATORY_CARE_PROVIDER_SITE_OTHER): Payer: BC Managed Care – PPO | Admitting: Family Medicine

## 2013-12-14 VITALS — BP 132/84 | HR 88 | Temp 98.5°F | Wt 221.0 lb

## 2013-12-14 DIAGNOSIS — F32A Depression, unspecified: Secondary | ICD-10-CM

## 2013-12-14 DIAGNOSIS — F329 Major depressive disorder, single episode, unspecified: Secondary | ICD-10-CM

## 2013-12-14 DIAGNOSIS — F3289 Other specified depressive episodes: Secondary | ICD-10-CM

## 2013-12-14 MED ORDER — BUPROPION HCL ER (XL) 300 MG PO TB24: 300.0000 mg | ORAL_TABLET | Freq: Every day | ORAL | Status: DC

## 2013-12-14 MED ORDER — SERTRALINE HCL 50 MG PO TABS: 50.0000 mg | ORAL_TABLET | Freq: Every day | ORAL | Status: DC

## 2013-12-14 NOTE — Progress Notes (Signed)
   Subjective:    Patient ID: Catherine Washington, female    DOB: 01-Feb-1976, 38 y.o.   MRN: [id number]  HPI Patient here for followup depression. She's been on Wellbutrin XL 300 mg once daily. She's still having some sleep difficulties and depressed mood. Overall she has seen improvements with Wellbutrin but does not feel she is in remission. Occasional crying spells. She had recent stress with change of jobs. 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 Previously at [employer]. No suicidal ideation  Past Medical History  Diagnosis Date  . Chicken pox   . Depression   . Allergy   . Hyperlipidemia   . UTI (urinary tract infection)    Past Surgical History  Procedure Laterality Date  . Childbirth  1995, 2003  . Cholecystectomy    . Abdominal hysterectomy  2006    dysfunctional bleeding    reports that she has never smoked. She has never used smokeless tobacco. She reports that she does not drink alcohol or use illicit drugs. family history includes Cancer in her maternal aunt and paternal grandmother; Diabetes in her father, maternal grandmother, and paternal grandfather; Heart disease (age of onset: 92) in her father; Hyperlipidemia in her father and mother; Hypertension in her father; Mental illness in her mother; Stroke in her paternal grandmother. Allergies  Allergen Reactions  . Nitrofurantoin Monohyd Macro     GI upset     Review of Systems  Constitutional: Negative for appetite change and unexpected weight change.  Psychiatric/Behavioral: Positive for sleep disturbance and dysphoric mood. Negative for suicidal ideas and confusion.       Objective:   Physical Exam  Constitutional: She appears well-developed and well-nourished.  Cardiovascular: Normal rate.   Psychiatric: Her behavior is normal. Judgment and thought content normal.  Depressed mood          Assessment & Plan:  Maj. depression. Patient still battling some symptoms. Add  sertraline 50 mg once daily and continue Wellbutrin XL 300 mg once daily. Touch base 3 weeks if not seeing additional improvements. We talked about nonpharmacologic ways of helping improve her mood as well such as more consistent exercise

## 2013-12-14 NOTE — Progress Notes (Signed)
Pre visit review using our clinic review tool, if applicable. No additional management support is needed unless otherwise documented below in the visit note. 

## 2013-12-14 NOTE — Patient Instructions (Signed)
Touch base in  2-3 weeks if not seeing additional improvements in depression with addition of Sertraline.

## 2014-01-06 ENCOUNTER — Other Ambulatory Visit: Payer: Self-pay | Admitting: Obstetrics and Gynecology

## 2014-07-30 ENCOUNTER — Other Ambulatory Visit: Payer: Self-pay | Admitting: Family Medicine

## 2014-10-05 ENCOUNTER — Other Ambulatory Visit (INDEPENDENT_AMBULATORY_CARE_PROVIDER_SITE_OTHER): Payer: BC Managed Care – PPO

## 2014-10-05 DIAGNOSIS — Z0000 Encounter for general adult medical examination without abnormal findings: Secondary | ICD-10-CM

## 2014-10-05 LAB — POCT URINALYSIS DIPSTICK
Bilirubin, UA: NEGATIVE
GLUCOSE UA: NEGATIVE
KETONES UA: NEGATIVE
NITRITE UA: NEGATIVE
Protein, UA: NEGATIVE
RBC UA: NEGATIVE
Spec Grav, UA: 1.02
Urobilinogen, UA: 0.2
pH, UA: 5.5

## 2014-10-05 LAB — COMPREHENSIVE METABOLIC PANEL
ALK PHOS: 63 U/L (ref 39–117)
ALT: 16 U/L (ref 0–35)
AST: 17 U/L (ref 0–37)
Albumin: 4.3 g/dL (ref 3.5–5.2)
BUN: 12 mg/dL (ref 6–23)
CO2: 26 meq/L (ref 19–32)
Calcium: 9.5 mg/dL (ref 8.4–10.5)
Chloride: 103 mEq/L (ref 96–112)
Creatinine, Ser: 0.81 mg/dL (ref 0.40–1.20)
GFR: 84.07 mL/min (ref >60.00)
Glucose, Bld: 93 mg/dL (ref 70–99)
POTASSIUM: 4.1 meq/L (ref 3.5–5.1)
SODIUM: 135 meq/L (ref 135–145)
Total Bilirubin: 0.4 mg/dL (ref 0.2–1.2)
Total Protein: 7.2 g/dL (ref 6.0–8.3)

## 2014-10-05 LAB — CBC WITH DIFFERENTIAL/PLATELET
BASOS ABS: 0.1 10*3/uL (ref 0.0–0.1)
BASOS PCT: 0.6 % (ref 0.0–3.0)
Eosinophils Absolute: 0.6 10*3/uL (ref 0.0–0.7)
Eosinophils Relative: 5.6 % — ABNORMAL HIGH (ref 0.0–5.0)
HCT: 41 % (ref 36.0–46.0)
HEMOGLOBIN: 13.3 g/dL (ref 12.0–15.0)
LYMPHS PCT: 30.8 % (ref 12.0–46.0)
Lymphs Abs: 3.2 10*3/uL (ref 0.7–4.0)
MCHC: 32.5 g/dL (ref 30.0–36.0)
MCV: 85.5 fl (ref 78.0–100.0)
MONOS PCT: 6.5 % (ref 3.0–12.0)
Monocytes Absolute: 0.7 10*3/uL (ref 0.1–1.0)
Neutro Abs: 5.9 10*3/uL (ref 1.4–7.7)
Neutrophils Relative %: 56.5 % (ref 43.0–77.0)
Platelets: 447 10*3/uL — ABNORMAL HIGH (ref 150.0–400.0)
RBC: 4.79 Mil/uL (ref 3.87–5.11)
RDW: 14.6 % (ref 11.5–15.5)
WBC: 10.4 10*3/uL (ref 4.0–10.5)

## 2014-10-05 LAB — LIPID PANEL
CHOLESTEROL: 189 mg/dL (ref 0–200)
HDL: 44.1 mg/dL (ref >39.00)
LDL Cholesterol: 106 mg/dL — ABNORMAL HIGH (ref 0–99)
NonHDL: 144.9
TRIGLYCERIDES: 193 mg/dL — AB (ref 0.0–149.0)
Total CHOL/HDL Ratio: 4
VLDL: 38.6 mg/dL (ref 0.0–40.0)

## 2014-10-05 LAB — TSH: TSH: 2.71 u[IU]/mL (ref 0.35–4.50)

## 2014-10-11 ENCOUNTER — Encounter: Payer: Self-pay | Admitting: Family Medicine

## 2014-10-11 ENCOUNTER — Ambulatory Visit (INDEPENDENT_AMBULATORY_CARE_PROVIDER_SITE_OTHER): Payer: BC Managed Care – PPO | Admitting: Family Medicine

## 2014-10-11 VITALS — BP 130/90 | HR 106 | Temp 98.4°F | Ht 64.0 in | Wt 238.0 lb

## 2014-10-11 DIAGNOSIS — D229 Melanocytic nevi, unspecified: Secondary | ICD-10-CM

## 2014-10-11 DIAGNOSIS — D239 Other benign neoplasm of skin, unspecified: Secondary | ICD-10-CM

## 2014-10-11 DIAGNOSIS — Z0000 Encounter for general adult medical examination without abnormal findings: Secondary | ICD-10-CM

## 2014-10-11 NOTE — Progress Notes (Signed)
Pre visit review using our clinic review tool, if applicable. No additional management support is needed unless otherwise documented below in the visit note. 

## 2014-10-11 NOTE — Progress Notes (Signed)
Subjective:    Patient ID: Catherine Washington, female    DOB: 1976-03-16, 39 y.o.   MRN: [id number]  HPI Patient is seen for complete physical. She sees gynecologist regularly. She has history of depression which is currently stable with combination therapy of sertraline and Wellbutrin. She's had previous cholecystectomy takes Questran every other day which helps control her loose stools. She has struggled with obesity all her adult life. She has considered gastric bypass surgery and has done some research on this. She has comorbidities of dyslipidemia and borderline elevated blood pressure. She has family history of type 2 diabetes in both parents.  She has tried multiple weight loss programs including Weight Watchers, low calorie diets, and Slim fast without success.  Past Medical History  Diagnosis Date  . Chicken pox   . Depression   . Allergy   . Hyperlipidemia   . UTI (urinary tract infection)    Past Surgical History  Procedure Laterality Date  . Childbirth  1995, 2003  . Cholecystectomy    . Abdominal hysterectomy  2006    dysfunctional bleeding    reports that she has never smoked. She has never used smokeless tobacco. She reports that she does not drink alcohol or use illicit drugs. family history includes Cancer in her maternal aunt and paternal grandmother; Diabetes in her father, maternal grandmother, and paternal grandfather; Heart disease (age of onset: 56) in her father; Hyperlipidemia in her father and mother; Hypertension in her father; Mental illness in her mother; Stroke in her paternal grandmother. Allergies  Allergen Reactions  . Nitrofurantoin Monohyd Macro     GI upset      Review of Systems  Constitutional: Negative for fever, activity change, appetite change, fatigue and unexpected weight change.  HENT: Negative for ear pain, hearing loss, sore throat and trouble swallowing.   Eyes: Negative for visual disturbance.  Respiratory: Negative for cough and  shortness of breath.   Cardiovascular: Negative for chest pain and palpitations.  Gastrointestinal: Negative for abdominal pain, diarrhea, constipation and blood in stool.  Genitourinary: Negative for dysuria and hematuria.  Musculoskeletal: Negative for myalgias, back pain and arthralgias.  Skin: Negative for rash.  Neurological: Negative for dizziness, syncope and headaches.  Hematological: Negative for adenopathy.  Psychiatric/Behavioral: Negative for confusion and dysphoric mood.       Objective:   Physical Exam  Constitutional: She is oriented to person, place, and time. She appears well-developed and well-nourished.  HENT:  Head: Normocephalic and atraumatic.  Eyes: EOM are normal. Pupils are equal, round, and reactive to light.  Neck: Normal range of motion. Neck supple. No thyromegaly present.  Cardiovascular: Normal rate, regular rhythm and normal heart sounds.   No murmur heard. Pulmonary/Chest: Breath sounds normal. No respiratory distress. She has no wheezes. She has no rales.  Abdominal: Soft. Bowel sounds are normal. She exhibits no distension and no mass. There is no tenderness. There is no rebound and no guarding.  Musculoskeletal: Normal range of motion. She exhibits no edema.  Lymphadenopathy:    She has no cervical adenopathy.  Neurological: She is alert and oriented to person, place, and time. She displays normal reflexes. No cranial nerve deficit.  Skin: No rash noted.  Large nevus about 10 mm diameter just right of lower lumbar region. There is some color variegation and slightly irregular border.  Psychiatric: She has a normal mood and affect. Her behavior is normal. Judgment and thought content normal.  Assessment & Plan:  Complete physical. Labs reviewed. She has dyslipidemia. Borderline elevated blood pressure. Strongly advocate weight loss. She has tried many programs. She is considering gastric bypass surgery. Her comorbidities include  dyslipidemia, borderline elevated blood pressure and she has very strong family history of type 2 diabetes.  Atypical nevus as above. Set up dermatology assessment

## 2014-10-24 ENCOUNTER — Encounter: Payer: Self-pay | Admitting: Family Medicine

## 2014-11-11 ENCOUNTER — Other Ambulatory Visit (INDEPENDENT_AMBULATORY_CARE_PROVIDER_SITE_OTHER): Payer: Self-pay

## 2014-12-06 ENCOUNTER — Encounter (HOSPITAL_COMMUNITY)
Admission: RE | Disposition: A | Discharge status: Home / Self Care | Payer: Self-pay | Source: Ambulatory Visit | Attending: General Surgery

## 2014-12-06 ENCOUNTER — Ambulatory Visit (HOSPITAL_COMMUNITY)
Admission: RE | Admit: 2014-12-06 | Discharge: 2014-12-06 | Disposition: A | Discharge status: Home / Self Care | Payer: BC Managed Care – PPO | Source: Ambulatory Visit | Attending: General Surgery | Admitting: General Surgery

## 2014-12-06 HISTORY — PX: BREATH TEK H PYLORI: SHX5422

## 2014-12-06 SURGERY — BREATH TEST, FOR HELICOBACTER PYLORI

## 2014-12-06 NOTE — Progress Notes (Signed)
   12/06/14 [place]  Referring MD Dr Greer Pickerel  Time of Last PO Intake 1800  Baseline Breath At: 2409  Pranactin Given At: 0800  Post-Dose Breath At: 0815  Sample 1 3.4%  Sample 2 2.3%  Test Negative

## 2014-12-07 ENCOUNTER — Encounter (HOSPITAL_COMMUNITY): Payer: Self-pay | Admitting: General Surgery

## 2014-12-14 ENCOUNTER — Other Ambulatory Visit: Payer: Self-pay

## 2014-12-14 ENCOUNTER — Ambulatory Visit (HOSPITAL_COMMUNITY)
Admission: RE | Admit: 2014-12-14 | Discharge: 2014-12-14 | Disposition: A | Discharge status: Home / Self Care | Payer: BC Managed Care – PPO | Source: Ambulatory Visit | Attending: General Surgery | Admitting: General Surgery

## 2014-12-14 ENCOUNTER — Ambulatory Visit: Payer: BC Managed Care – PPO | Admitting: Dietician

## 2014-12-14 DIAGNOSIS — Z6830 Body mass index (BMI) 30.0-30.9, adult: Secondary | ICD-10-CM | POA: Diagnosis not present

## 2014-12-14 DIAGNOSIS — E785 Hyperlipidemia, unspecified: Secondary | ICD-10-CM | POA: Diagnosis not present

## 2014-12-14 DIAGNOSIS — K8010 Calculus of gallbladder with chronic cholecystitis without obstruction: Secondary | ICD-10-CM | POA: Diagnosis not present

## 2014-12-14 DIAGNOSIS — F329 Major depressive disorder, single episode, unspecified: Secondary | ICD-10-CM | POA: Insufficient documentation

## 2014-12-14 DIAGNOSIS — Z9049 Acquired absence of other specified parts of digestive tract: Secondary | ICD-10-CM | POA: Insufficient documentation

## 2014-12-21 ENCOUNTER — Encounter: Payer: BC Managed Care – PPO | Attending: General Surgery | Admitting: Dietician

## 2014-12-21 ENCOUNTER — Encounter: Payer: Self-pay | Admitting: Dietician

## 2014-12-21 VITALS — Ht 64.0 in | Wt 238.3 lb

## 2014-12-21 DIAGNOSIS — Z6841 Body Mass Index (BMI) 40.0 and over, adult: Secondary | ICD-10-CM | POA: Diagnosis not present

## 2014-12-21 DIAGNOSIS — E669 Obesity, unspecified: Secondary | ICD-10-CM | POA: Insufficient documentation

## 2014-12-21 DIAGNOSIS — Z713 Dietary counseling and surveillance: Secondary | ICD-10-CM | POA: Diagnosis not present

## 2014-12-21 NOTE — Patient Instructions (Signed)

## 2014-12-21 NOTE — Progress Notes (Signed)
  Pre-Op Assessment Visit:  Pre-Operative RYGB Surgery  Medical Nutrition Therapy:  Appt start time: 0925   End time:  0945  Patient was seen on 12/21/14 for Pre-Operative Nutrition Assessment. Assessment and letter of approval faxed to [facility] Bariatric Surgery Program coordinator on 12/21/14.   Preferred Learning Style:   No preference indicated   Learning Readiness:   Ready  Handouts given during visit include:  Pre-Op Goals Bariatric Surgery Protein Shakes   During the appointment today the following Pre-Op Goals were reviewed with the patient: Maintain or lose weight as instructed by your surgeon Make healthy food choices Begin to limit portion sizes Limited concentrated sugars and fried foods Keep fat/sugar in the single digits per serving on   food labels Practice CHEWING your food  (aim for 30 chews per bite or until applesauce consistency) Practice not drinking 15 minutes before, during, and 30 minutes after each meal/snack Avoid all carbonated beverages  Avoid/limit caffeinated beverages  Avoid all sugar-sweetened beverages Consume 3 meals per day; eat every 3-5 hours Make a list of non-food related activities Aim for 64-100 ounces of FLUID daily  Aim for at least 60-80 grams of PROTEIN daily Look for a liquid protein source that contain ?15 g protein and ?5 g carbohydrate  (ex: shakes, drinks, shots)  Patient-Centered Goals: -Increased mobility -Reduced pain -More activities with children  Scale of 1-10: confidence(10) /importance (10)  Demonstrated degree of understanding via:  Teach Back  Teaching Method Utilized:  Visual Auditory Hands on  Barriers to learning/adherence to lifestyle change: none  Patient to call the [facility] to enroll in Pre-Op and Post-Op Nutrition Education when surgery date is scheduled.

## 2015-01-07 ENCOUNTER — Other Ambulatory Visit: Payer: Self-pay | Admitting: Family Medicine

## 2015-01-10 ENCOUNTER — Other Ambulatory Visit: Payer: Self-pay | Admitting: Family Medicine

## 2015-01-19 ENCOUNTER — Ambulatory Visit: Payer: Self-pay | Admitting: General Surgery

## 2015-01-19 ENCOUNTER — Other Ambulatory Visit: Payer: Self-pay | Admitting: Obstetrics and Gynecology

## 2015-01-20 LAB — CYTOLOGY - PAP

## 2015-01-31 ENCOUNTER — Telehealth: Payer: Self-pay | Admitting: Family

## 2015-01-31 DIAGNOSIS — J019 Acute sinusitis, unspecified: Secondary | ICD-10-CM

## 2015-01-31 MED ORDER — AMOXICILLIN-POT CLAVULANATE 875-125 MG PO TABS: 1.0000 | ORAL_TABLET | Freq: Two times a day (BID) | ORAL | Status: DC

## 2015-01-31 NOTE — Progress Notes (Signed)

## 2015-02-17 ENCOUNTER — Ambulatory Visit: Payer: Self-pay | Admitting: General Surgery

## 2015-02-17 NOTE — H&P (Signed)
Catherine Q. Catherine Washington 02/17/2015 11:47 AM Location: [facility] Patient #: [id number] DOB: 01/03/1976 Married / Language: English / Race: White Female History of Present Illness (Kane Kusek M. Damarius Karnes MD; 02/17/2015 1:30 PM) Patient words: bariatric.  The patient is a 39 year old female who presents for a pre-op visit. She has been evaluated and approved for laparoscopic Roux-en-Y gastric bypass surgery. I initially met her on November 11, 2014. Her weight at that time was 236 pounds. She denies any changes since I last saw her. She denies any trips to the hospital in the emergency room. She denies any new medications or allergies. She denies any blood thinners. Her workup was unremarkable. Her upper GI was within normal limits. Her chest x-ray and EKG were within normal limits. Her H. pylori breath test was negative. Her lab work was essentially unremarkable except for total cholesterol level of 225, triglyceride level of 268, LDL level of 121. Her vitamin D level was low at 22.4. She is taking supplemental vitamin D. Her hemoglobin A1c was 5.5 Problem List/Past Medical (Layden Caterino M Ethie Curless, MD; 02/17/2015 1:30 PM) DEPRESSION, CONTROLLED (311  F32.9) HYPERCHOLESTEROLEMIA (272.0  E78.0) OBESITY, MORBID, BMI 40.0-49.9 (278.01  E66.01) VITAMIN D DEFICIENCY (268.9  E55.9)  Other Problems (Indalecio Malmstrom M Kayl Stogdill, MD; 02/17/2015 1:30 PM) Migraine Headache Cholelithiasis  Past Surgical History (Kamber Vignola M Jammy Stlouis, MD; 02/17/2015 1:30 PM) Hysterectomy (not due to cancer) - Partial Gallbladder Surgery - Laparoscopic Foot Surgery Bilateral.  Diagnostic Studies History (Mandalyn Pasqua M Ethie Curless, MD; 02/17/2015 1:30 PM) Mammogram never Colonoscopy never Pap Smear 1-5 years ago  Allergies (Sonya Bynum, CMA; 02/17/2015 11:48 AM) Nitrofurantoin Monohyd Macro *URINARY ANTI-INFECTIVES* Vomiting. Adhesive Tape Rash. surgical tape  Medication History (Delmas Faucett M Consepcion Utt, MD; 02/17/2015 1:30 PM) BuPROPion HCl ER (XL)  (300MG Tablet ER 24HR, Oral) Active. Cholestyramine (4GM Packet, Oral) Active. Sertraline HCl (50MG Tablet, Oral) Active. ZyrTEC Allergy (10MG Tablet, Oral) Active. Multivitamin (Oral) Active. Medications Reconciled OxyCODONE HCl (5MG/5ML Solution, 5-10 Milliliter Oral every four hours, as needed, Taken starting 02/17/2015) Active.  Social History (Dallana Mavity M Zeb Rawl, MD; 02/17/2015 1:30 PM) Caffeine use Carbonated beverages. Alcohol use Remotely quit alcohol use. Tobacco use Never smoker. No drug use  Family History (Georgana Romain M Estrellita Lasky, MD; 02/17/2015 1:30 PM) Diabetes Mellitus Father, Mother. Depression Mother, Son. Respiratory Condition Father. Migraine Headache Son. Thyroid problems Father, Mother. Heart disease in female family member before age 55 Heart Disease Father. Melanoma Father. Hypertension Father.  Pregnancy / Birth History (Karyna Bessler M Esmay Amspacher, MD; 02/17/2015 1:30 PM) Contraceptive History Intrauterine device, Oral contraceptives. Age at menarche 12 years. Irregular periods Gravida 2 Para 2 Maternal age 15-20     Review of Systems (Devera Englander M Willman Cuny, MD; 02/17/2015 1:28 00) General Not Present- Appetite Loss, Chills, Fatigue, Fever, Night Sweats, Weight Gain and Weight Loss. Skin Not Present- Change in Wart/Mole, Dryness, Hives, Jaundice, New Lesions, Non-Healing Wounds, Rash and Ulcer. HEENT Present- Seasonal Allergies. Not Present- Earache, Hearing Loss, Hoarseness, Nose Bleed, Oral Ulcers, Ringing in the Ears, Sinus Pain, Sore Throat, Visual Disturbances, Wears glasses/contact lenses and Yellow Eyes. Respiratory Not Present- Bloody sputum, Chronic Cough, Difficulty Breathing, Snoring and Wheezing. Breast Not Present- Breast Mass, Breast Pain, Nipple Discharge and Skin Changes. Cardiovascular Not Present- Chest Pain, Difficulty Breathing Lying Down, Leg Cramps, Palpitations, Rapid Heart Rate, Shortness of Breath and Swelling of Extremities. Gastrointestinal Not  Present- Abdominal Pain, Bloating, Bloody Stool, Change in Bowel Habits, Chronic diarrhea, Constipation, Difficulty Swallowing, Excessive gas, Gets full quickly at meals, Hemorrhoids, Indigestion, Nausea, Rectal Pain   and Vomiting. Female Genitourinary Not Present- Frequency, Nocturia, Painful Urination, Pelvic Pain and Urgency. Musculoskeletal Not Present- Back Pain, Joint Pain, Joint Stiffness, Muscle Pain, Muscle Weakness and Swelling of Extremities. Neurological Not Present- Decreased Memory, Fainting, Headaches, Numbness, Seizures, Tingling, Tremor, Trouble walking and Weakness. Psychiatric Present- Depression. Not Present- Anxiety, Bipolar, Change in Sleep Pattern, Fearful and Frequent crying. Endocrine Not Present- Cold Intolerance, Excessive Hunger, Hair Changes, Heat Intolerance, Hot flashes and New Diabetes. Hematology Not Present- Easy Bruising, Excessive bleeding, Gland problems, HIV and Persistent Infections.  Vitals (Sonya Bynum CMA; 02/17/2015 11:47 AM) 02/17/2015 11:47 AM Weight: 242.2 lb Height: 64in Body Surface Area: 2.23 m Body Mass Index: 41.57 kg/m Temp.: 97.5F(Temporal)  Pulse: 79 (Regular)  BP: 128/82 (Sitting, Left Arm, Standard)     Physical Exam (Tabbatha Bordelon M. Ellary Casamento MD; 02/17/2015 1:27 PM)  General Mental Status-Alert. General Appearance-Consistent with stated age. Hydration-Well hydrated. Voice-Normal. Note: morbidly obese, evenly distributed   Head and Neck Head-normocephalic, atraumatic with no lesions or palpable masses. Trachea-midline. Thyroid Gland Characteristics - normal size and consistency.  Eye Eyeball - Bilateral-Extraocular movements intact. Sclera/Conjunctiva - Bilateral-No scleral icterus.  Chest and Lung Exam Chest and lung exam reveals -quiet, even and easy respiratory effort with no use of accessory muscles and on auscultation, normal breath sounds, no adventitious sounds and normal vocal  resonance. Inspection Chest Wall - Normal. Back - normal.  Breast - Did not examine.  Cardiovascular Cardiovascular examination reveals -normal heart sounds, regular rate and rhythm with no murmurs and normal pedal pulses bilaterally.  Abdomen Inspection Inspection of the abdomen reveals - No Hernias. Skin - Scar - no surgical scars. Palpation/Percussion Palpation and Percussion of the abdomen reveal - Soft, Non Tender, No Rebound tenderness, No Rigidity (guarding) and No hepatosplenomegaly. Auscultation Auscultation of the abdomen reveals - Bowel sounds normal. Note: old trocar scars   Peripheral Vascular Upper Extremity Palpation - Pulses bilaterally normal.  Neurologic Neurologic evaluation reveals -alert and oriented x 3 with no impairment of recent or remote memory. Mental Status-Normal.  Neuropsychiatric The patient's mood and affect are described as -normal. Judgment and Insight-insight is appropriate concerning matters relevant to self.  Musculoskeletal Normal Exam - Left-Upper Extremity Strength Normal and Lower Extremity Strength Normal. Normal Exam - Right-Upper Extremity Strength Normal and Lower Extremity Strength Normal.  Lymphatic Head & Neck  General Head & Neck Lymphatics: Bilateral - Description - Normal. Axillary - Did not examine. Femoral & Inguinal - Did not examine.    Assessment & Plan (Chun Sellen M. Rehanna Oloughlin MD; 02/17/2015 1:30 PM)  OBESITY, MORBID, BMI 40.0-49.9 (278.01  E66.01) Impression: We reviewed her preoperative workup. We discussed her lab results. All of her questions were asked and answered. We discussed the importance of the preoperative diet. She was given her postoperative pain medicine prescription today. She was also given instructions for her bowel prep. I encouraged her to contact the office should she have any questions before surgery. Her surgery scheduled for June 20  Current Plans Pt Education -  EMW_preopbariatric Started OxyCODONE HCl 5MG/5ML, 5-10 Milliliter every four hours, as needed, 200 Milliliter, 02/17/2015, No Refill. HYPERCHOLESTEROLEMIA (272.0  E78.0)  DEPRESSION, CONTROLLED (311  F32.9)  VITAMIN D DEFICIENCY (268.9  E55.9)  Rilley Stash M. Leslieann Whisman, MD, FACS General, Bariatric, & Minimally Invasive Surgery [facility]  

## 2015-02-21 ENCOUNTER — Encounter: Payer: BC Managed Care – PPO | Attending: General Surgery

## 2015-02-21 VITALS — Ht 64.0 in | Wt 242.5 lb

## 2015-02-21 DIAGNOSIS — E669 Obesity, unspecified: Secondary | ICD-10-CM | POA: Insufficient documentation

## 2015-02-21 DIAGNOSIS — Z713 Dietary counseling and surveillance: Secondary | ICD-10-CM | POA: Insufficient documentation

## 2015-02-21 DIAGNOSIS — Z6841 Body Mass Index (BMI) 40.0 and over, adult: Secondary | ICD-10-CM | POA: Diagnosis not present

## 2015-02-21 NOTE — Progress Notes (Signed)
  Pre-Operative Nutrition Class:  Appt start time: 830   End time:  930.  Patient was seen on 02/21/15 for Pre-Operative Bariatric Surgery Education at [facility].   Surgery date: 03/07/15 Surgery type: RYGB Start weight at [facility]: 238 lbs on 12/21/14  Weight today: 242.5 lbs  TANITA  BODY COMP RESULTS  02/21/15   BMI (kg/m^2) 41.6   Fat Mass (lbs) 112   Fat Free Mass (lbs) 130.5   Total Body Water (lbs) 95.5   Samples given per MNT protocol. Patient educated on appropriate usage: Premier protein shake (vanilla - qty 1) Lot #: [id number] Exp: 09/2015  Unjury protein powder (unflavored - qty 1) Lot #: [id number] Exp: 03/2016  Celebrate Calcium citrate chew (caramel - qty 1) Lot #: [id number] Exp: 11/2016  PB2 (chocolate - qty 1) Lot #: none Exp: 07/2015  The following the learning objectives were met by the patient during this course:  Identify Pre-Op Dietary Goals and will begin 2 weeks pre-operatively  Identify appropriate sources of fluids and proteins   State protein recommendations and appropriate sources pre and post-operatively  Identify Post-Operative Dietary Goals and will follow for 2 weeks post-operatively  Identify appropriate multivitamin and calcium sources  Describe the need for physical activity post-operatively and will follow MD recommendations  State when to call healthcare provider regarding medication questions or post-operative complications  Handouts given during class include:  Pre-Op Bariatric Surgery Diet Handout  Protein Shake Handout  Post-Op Bariatric Surgery Nutrition Handout  BELT Program Information Flyer  Support Group Information Flyer  [facility] Bariatric Supplements Price List  Follow-Up Plan: Patient will follow-up at [facility] 2 weeks post operatively for diet advancement per MD.

## 2015-02-23 NOTE — Patient Instructions (Signed)
Catherine Washington  02/23/2015   Your procedure is scheduled on:   03/07/2015    Report to [facility] Main  Entrance and follow signs to               [place] at     [place].  Call this number if you have problems the morning of surgery [phone]   Remember: ONLY 1 PERSON MAY GO WITH YOU TO SHORT STAY TO GET  READY MORNING OF [place].  Do not eat food or drink liquids :After Midnight.     Take these medicines the morning of surgery with A SIP OF WATER:   Wellbutrin, Zyrtec if needed, Zoloft                                You may not have any metal on your body including hair pins and              piercings  Do not wear jewelry, make-up, lotions, powders or perfumes, deodorant             Do not wear nail polish.  Do not shave  48 hours prior to surgery.                 Do not bring valuables to the hospital. [place].  Contacts, dentures or bridgework may not be worn into surgery.  Leave suitcase in the car. After surgery it may be brought to your room.         Special Instructions: coughing and deep breathing exercises, leg exercises               Please read over the following fact sheets you were given: _____________________________________________________________________             [facility] - Preparing for Surgery Before surgery, you can play an important role.  Because skin is not sterile, your skin needs to be as free of germs as possible.  You can reduce the number of germs on your skin by washing with CHG (chlorahexidine gluconate) soap before surgery.  CHG is an antiseptic cleaner which kills germs and bonds with the skin to continue killing germs even after washing. Please DO NOT use if you have an allergy to CHG or antibacterial soaps.  If your skin becomes reddened/irritated stop using the CHG and inform your nurse when you arrive at Short Stay. Do not shave (including legs and  underarms) for at least 48 hours prior to the first CHG shower.  You may shave your face/neck. Please follow these instructions carefully:  1.  Shower with CHG Soap the night before surgery and the  morning of Surgery.  2.  If you choose to wash your hair, wash your hair first as usual with your  normal  shampoo.  3.  After you shampoo, rinse your hair and body thoroughly to remove the  shampoo.                           4.  Use CHG as you would any other liquid soap.  You can apply chg directly  to the skin and wash  Gently with a scrungie or clean washcloth.  5.  Apply the CHG Soap to your body ONLY FROM THE NECK DOWN.   Do not use on face/ open                           Wound or open sores. Avoid contact with eyes, ears mouth and genitals (private parts).                       Wash face,  Genitals (private parts) with your normal soap.             6.  Wash thoroughly, paying special attention to the area where your surgery  will be performed.  7.  Thoroughly rinse your body with warm water from the neck down.  8.  DO NOT shower/wash with your normal soap after using and rinsing off  the CHG Soap.                9.  Pat yourself dry with a clean towel.            10.  Wear clean pajamas.            11.  Place clean sheets on your bed the night of your first shower and do not  sleep with pets. Day of Surgery : Do not apply any lotions/deodorants the morning of surgery.  Please wear clean clothes to the hospital/surgery center.  FAILURE TO FOLLOW THESE INSTRUCTIONS MAY RESULT IN THE CANCELLATION OF YOUR SURGERY PATIENT SIGNATURE_________________________________  NURSE SIGNATURE__________________________________  ________________________________________________________________________

## 2015-02-24 ENCOUNTER — Encounter (HOSPITAL_COMMUNITY): Payer: Self-pay

## 2015-02-24 ENCOUNTER — Encounter (HOSPITAL_COMMUNITY)
Admission: RE | Admit: 2015-02-24 | Discharge: 2015-02-24 | Disposition: A | Discharge status: Home / Self Care | Payer: BC Managed Care – PPO | Source: Ambulatory Visit | Attending: General Surgery | Admitting: General Surgery

## 2015-02-24 DIAGNOSIS — Z01818 Encounter for other preprocedural examination: Secondary | ICD-10-CM | POA: Insufficient documentation

## 2015-02-24 HISTORY — DX: Family history of other specified conditions: Z84.89

## 2015-02-24 HISTORY — DX: Headache: R51

## 2015-02-24 HISTORY — DX: Headache, unspecified: R51.9

## 2015-02-24 LAB — CBC WITH DIFFERENTIAL/PLATELET
BASOS ABS: 0 10*3/uL (ref 0.0–0.1)
Basophils Relative: 1 % (ref 0–1)
EOS ABS: 0.5 10*3/uL (ref 0.0–0.7)
EOS PCT: 5 % (ref 0–5)
HCT: 37 % (ref 36.0–46.0)
Hemoglobin: 12.2 g/dL (ref 12.0–15.0)
LYMPHS ABS: 2.7 10*3/uL (ref 0.7–4.0)
Lymphocytes Relative: 31 % (ref 12–46)
MCH: 28.2 pg (ref 26.0–34.0)
MCHC: 33 g/dL (ref 30.0–36.0)
MCV: 85.5 fL (ref 78.0–100.0)
MONOS PCT: 7 % (ref 3–12)
Monocytes Absolute: 0.6 10*3/uL (ref 0.1–1.0)
NEUTROS ABS: 4.9 10*3/uL (ref 1.7–7.7)
Neutrophils Relative %: 56 % (ref 43–77)
Platelets: 471 10*3/uL — ABNORMAL HIGH (ref 150–400)
RBC: 4.33 MIL/uL (ref 3.87–5.11)
RDW: 14 % (ref 11.5–15.5)
WBC: 8.7 10*3/uL (ref 4.0–10.5)

## 2015-02-24 LAB — COMPREHENSIVE METABOLIC PANEL
ALBUMIN: 4.2 g/dL (ref 3.5–5.0)
ALT: 23 U/L (ref 14–54)
ANION GAP: 11 (ref 5–15)
AST: 25 U/L (ref 15–41)
Alkaline Phosphatase: 65 U/L (ref 38–126)
BUN: 18 mg/dL (ref 6–20)
CALCIUM: 9.7 mg/dL (ref 8.9–10.3)
CHLORIDE: 105 mmol/L (ref 101–111)
CO2: 26 mmol/L (ref 22–32)
CREATININE: 0.81 mg/dL (ref 0.44–1.00)
Glucose, Bld: 84 mg/dL (ref 65–99)
POTASSIUM: 3.8 mmol/L (ref 3.5–5.1)
Sodium: 142 mmol/L (ref 135–145)
Total Bilirubin: 0.5 mg/dL (ref 0.3–1.2)
Total Protein: 7.5 g/dL (ref 6.5–8.1)

## 2015-02-24 NOTE — Progress Notes (Signed)
EKG 12-14-14 EPIC CXR 12-14-14 EPIC Pt. Confirmed bowel prep instructions given to patient by office Pt. Advised to hold all herbs, nsaids, ASA, Plavix 7 days prior to surgery

## 2015-03-07 ENCOUNTER — Inpatient Hospital Stay (HOSPITAL_COMMUNITY): Payer: BC Managed Care – PPO | Admitting: Certified Registered Nurse Anesthetist

## 2015-03-07 ENCOUNTER — Encounter (HOSPITAL_COMMUNITY): Payer: Self-pay | Admitting: *Deleted

## 2015-03-07 ENCOUNTER — Encounter (HOSPITAL_COMMUNITY)
Admission: RE | Disposition: A | Discharge status: Home / Self Care | Payer: Self-pay | Source: Ambulatory Visit | Attending: General Surgery

## 2015-03-07 ENCOUNTER — Inpatient Hospital Stay (HOSPITAL_COMMUNITY)
Admission: RE | Admit: 2015-03-07 | Discharge: 2015-03-09 | DRG: 621 | Disposition: A | Discharge status: Home / Self Care | Payer: BC Managed Care – PPO | Source: Ambulatory Visit | Attending: General Surgery | Admitting: General Surgery

## 2015-03-07 DIAGNOSIS — E669 Obesity, unspecified: Secondary | ICD-10-CM | POA: Diagnosis present

## 2015-03-07 DIAGNOSIS — R110 Nausea: Secondary | ICD-10-CM

## 2015-03-07 DIAGNOSIS — N926 Irregular menstruation, unspecified: Secondary | ICD-10-CM | POA: Diagnosis present

## 2015-03-07 DIAGNOSIS — Z9049 Acquired absence of other specified parts of digestive tract: Secondary | ICD-10-CM | POA: Diagnosis present

## 2015-03-07 DIAGNOSIS — G43909 Migraine, unspecified, not intractable, without status migrainosus: Secondary | ICD-10-CM | POA: Diagnosis present

## 2015-03-07 DIAGNOSIS — Z91048 Other nonmedicinal substance allergy status: Secondary | ICD-10-CM

## 2015-03-07 DIAGNOSIS — Z6841 Body Mass Index (BMI) 40.0 and over, adult: Secondary | ICD-10-CM | POA: Diagnosis not present

## 2015-03-07 DIAGNOSIS — E785 Hyperlipidemia, unspecified: Secondary | ICD-10-CM | POA: Diagnosis present

## 2015-03-07 DIAGNOSIS — Z9884 Bariatric surgery status: Secondary | ICD-10-CM

## 2015-03-07 DIAGNOSIS — E559 Vitamin D deficiency, unspecified: Secondary | ICD-10-CM | POA: Diagnosis present

## 2015-03-07 DIAGNOSIS — Z888 Allergy status to other drugs, medicaments and biological substances status: Secondary | ICD-10-CM

## 2015-03-07 DIAGNOSIS — E781 Pure hyperglyceridemia: Secondary | ICD-10-CM | POA: Diagnosis present

## 2015-03-07 DIAGNOSIS — F339 Major depressive disorder, recurrent, unspecified: Secondary | ICD-10-CM | POA: Diagnosis present

## 2015-03-07 DIAGNOSIS — F329 Major depressive disorder, single episode, unspecified: Secondary | ICD-10-CM | POA: Diagnosis present

## 2015-03-07 HISTORY — PX: GASTRIC ROUX-EN-Y: SHX5262

## 2015-03-07 LAB — HEMOGLOBIN AND HEMATOCRIT, BLOOD
HEMATOCRIT: 36.8 % (ref 36.0–46.0)
HEMOGLOBIN: 12.2 g/dL (ref 12.0–15.0)

## 2015-03-07 SURGERY — LAPAROSCOPIC ROUX-EN-Y GASTRIC BYPASS WITH UPPER ENDOSCOPY
Anesthesia: General | Site: Abdomen

## 2015-03-07 MED ORDER — HEPARIN SODIUM (PORCINE) 5000 UNIT/ML IJ SOLN
5000.0000 [IU] | INTRAMUSCULAR | Status: AC
  Administered 2015-03-07: 5000 [IU] via SUBCUTANEOUS
  Filled 2015-03-07: qty 1

## 2015-03-07 MED ORDER — MORPHINE SULFATE 1 MG/ML IV SOLN
INTRAVENOUS | Status: AC
  Filled 2015-03-07: qty 25

## 2015-03-07 MED ORDER — CISATRACURIUM BESYLATE (PF) 10 MG/5ML IV SOLN
INTRAVENOUS | Status: DC | PRN
  Administered 2015-03-07: 6 mg via INTRAVENOUS
  Administered 2015-03-07 (×3): 2 mg via INTRAVENOUS

## 2015-03-07 MED ORDER — HYDROMORPHONE HCL 1 MG/ML IJ SOLN
INTRAMUSCULAR | Status: AC
  Filled 2015-03-07: qty 1

## 2015-03-07 MED ORDER — SODIUM CHLORIDE 0.9 % IJ SOLN
INTRAMUSCULAR | Status: DC | PRN
  Administered 2015-03-07: 50 mL

## 2015-03-07 MED ORDER — PANTOPRAZOLE SODIUM 40 MG IV SOLR
40.0000 mg | Freq: Every day | INTRAVENOUS | Status: DC
  Administered 2015-03-07 – 2015-03-08 (×2): 40 mg via INTRAVENOUS
  Filled 2015-03-07 (×3): qty 40

## 2015-03-07 MED ORDER — FENTANYL CITRATE (PF) 100 MCG/2ML IJ SOLN
INTRAMUSCULAR | Status: DC | PRN
  Administered 2015-03-07 (×3): 50 ug via INTRAVENOUS
  Administered 2015-03-07: 100 ug via INTRAVENOUS

## 2015-03-07 MED ORDER — STERILE WATER FOR IRRIGATION IR SOLN
Status: DC | PRN
  Administered 2015-03-07: 1500 mL

## 2015-03-07 MED ORDER — METHOCARBAMOL 1000 MG/10ML IJ SOLN
1000.0000 mg | Freq: Three times a day (TID) | INTRAVENOUS | Status: DC
  Administered 2015-03-07 – 2015-03-09 (×6): 1000 mg via INTRAVENOUS
  Filled 2015-03-07 (×8): qty 10

## 2015-03-07 MED ORDER — GLYCOPYRROLATE 0.2 MG/ML IJ SOLN
INTRAMUSCULAR | Status: AC
Start: 2015-03-07 — End: 2015-03-07
  Filled 2015-03-07: qty 3

## 2015-03-07 MED ORDER — CETYLPYRIDINIUM CHLORIDE 0.05 % MT LIQD
7.0000 mL | Freq: Two times a day (BID) | OROMUCOSAL | Status: DC
  Administered 2015-03-07 – 2015-03-08 (×3): 7 mL via OROMUCOSAL

## 2015-03-07 MED ORDER — KCL IN DEXTROSE-NACL 20-5-0.45 MEQ/L-%-% IV SOLN
INTRAVENOUS | Status: DC
  Administered 2015-03-07 – 2015-03-09 (×6): via INTRAVENOUS
  Filled 2015-03-07 (×8): qty 1000

## 2015-03-07 MED ORDER — ACETAMINOPHEN 160 MG/5ML PO SOLN: 650.0000 mg | ORAL | Status: DC | PRN

## 2015-03-07 MED ORDER — LACTATED RINGERS IR SOLN
Status: DC | PRN
  Administered 2015-03-07: 1000 mL

## 2015-03-07 MED ORDER — MIDAZOLAM HCL 5 MG/5ML IJ SOLN
INTRAMUSCULAR | Status: DC | PRN
  Administered 2015-03-07: 2 mg via INTRAVENOUS

## 2015-03-07 MED ORDER — ONDANSETRON HCL 4 MG/2ML IJ SOLN
4.0000 mg | INTRAMUSCULAR | Status: DC | PRN
  Administered 2015-03-08: 4 mg via INTRAVENOUS
  Filled 2015-03-07: qty 2

## 2015-03-07 MED ORDER — SODIUM CHLORIDE 0.9 % IJ SOLN
INTRAMUSCULAR | Status: AC
  Filled 2015-03-07: qty 50

## 2015-03-07 MED ORDER — PROMETHAZINE HCL 25 MG/ML IJ SOLN
INTRAMUSCULAR | Status: AC
  Filled 2015-03-07: qty 1

## 2015-03-07 MED ORDER — HYDROMORPHONE HCL 1 MG/ML IJ SOLN
0.2500 mg | INTRAMUSCULAR | Status: DC | PRN
  Administered 2015-03-07 (×5): 0.5 mg via INTRAVENOUS

## 2015-03-07 MED ORDER — PHENYLEPHRINE 40 MCG/ML (10ML) SYRINGE FOR IV PUSH (FOR BLOOD PRESSURE SUPPORT)
PREFILLED_SYRINGE | INTRAVENOUS | Status: AC
  Filled 2015-03-07: qty 10

## 2015-03-07 MED ORDER — GLYCOPYRROLATE 0.2 MG/ML IJ SOLN
INTRAMUSCULAR | Status: AC
  Filled 2015-03-07: qty 1

## 2015-03-07 MED ORDER — DIPHENHYDRAMINE HCL 50 MG/ML IJ SOLN
INTRAMUSCULAR | Status: DC | PRN
  Administered 2015-03-07: 12.5 mg via INTRAVENOUS

## 2015-03-07 MED ORDER — PROMETHAZINE HCL 25 MG/ML IJ SOLN: 12.5000 mg | Freq: Four times a day (QID) | INTRAMUSCULAR | Status: DC | PRN

## 2015-03-07 MED ORDER — ONDANSETRON HCL 4 MG/2ML IJ SOLN
INTRAMUSCULAR | Status: DC | PRN
  Administered 2015-03-07: 4 mg via INTRAVENOUS

## 2015-03-07 MED ORDER — MEPERIDINE HCL 50 MG/ML IJ SOLN: 6.2500 mg | INTRAMUSCULAR | Status: DC | PRN

## 2015-03-07 MED ORDER — UNJURY VANILLA POWDER: 2.0000 [oz_av] | Freq: Four times a day (QID) | ORAL | Status: DC

## 2015-03-07 MED ORDER — UNJURY CHICKEN SOUP POWDER: 2.0000 [oz_av] | Freq: Four times a day (QID) | ORAL | Status: DC

## 2015-03-07 MED ORDER — PROMETHAZINE HCL 25 MG/ML IJ SOLN
6.2500 mg | INTRAMUSCULAR | Status: AC | PRN
Start: 2015-03-07 — End: 2015-03-07
  Administered 2015-03-07 (×2): 6.25 mg via INTRAVENOUS

## 2015-03-07 MED ORDER — SUCCINYLCHOLINE CHLORIDE 20 MG/ML IJ SOLN
INTRAMUSCULAR | Status: DC | PRN
  Administered 2015-03-07: 100 mg via INTRAVENOUS

## 2015-03-07 MED ORDER — DEXAMETHASONE SODIUM PHOSPHATE 10 MG/ML IJ SOLN
INTRAMUSCULAR | Status: AC
  Filled 2015-03-07: qty 1

## 2015-03-07 MED ORDER — FENTANYL CITRATE (PF) 250 MCG/5ML IJ SOLN
INTRAMUSCULAR | Status: AC
  Filled 2015-03-07: qty 5

## 2015-03-07 MED ORDER — LIDOCAINE HCL (CARDIAC) 20 MG/ML IV SOLN
INTRAVENOUS | Status: DC | PRN
  Administered 2015-03-07 (×2): 50 mg via INTRAVENOUS

## 2015-03-07 MED ORDER — HYDROMORPHONE HCL 1 MG/ML IJ SOLN
0.2500 mg | INTRAMUSCULAR | Status: DC | PRN
  Administered 2015-03-07: 0.5 mg via INTRAVENOUS

## 2015-03-07 MED ORDER — CEFOTETAN DISODIUM-DEXTROSE 2-2.08 GM-% IV SOLR
INTRAVENOUS | Status: AC
  Filled 2015-03-07: qty 50

## 2015-03-07 MED ORDER — OXYCODONE HCL 5 MG/5ML PO SOLN
5.0000 mg | ORAL | Status: DC | PRN
  Administered 2015-03-08: 10 mg via ORAL
  Administered 2015-03-09: 5 mg via ORAL
  Filled 2015-03-07: qty 25
  Filled 2015-03-07: qty 10

## 2015-03-07 MED ORDER — UNJURY CHOCOLATE CLASSIC POWDER: 2.0000 [oz_av] | Freq: Four times a day (QID) | ORAL | Status: DC

## 2015-03-07 MED ORDER — ENOXAPARIN SODIUM 30 MG/0.3ML ~~LOC~~ SOLN
30.0000 mg | Freq: Two times a day (BID) | SUBCUTANEOUS | Status: DC
  Administered 2015-03-08 – 2015-03-09 (×3): 30 mg via SUBCUTANEOUS
  Filled 2015-03-07 (×6): qty 0.3

## 2015-03-07 MED ORDER — CEFOTETAN DISODIUM-DEXTROSE 2-2.08 GM-% IV SOLR
2.0000 g | INTRAVENOUS | Status: AC
  Administered 2015-03-07: 2 g via INTRAVENOUS

## 2015-03-07 MED ORDER — MIDAZOLAM HCL 2 MG/2ML IJ SOLN
INTRAMUSCULAR | Status: AC
  Filled 2015-03-07: qty 2

## 2015-03-07 MED ORDER — LIDOCAINE HCL (CARDIAC) 20 MG/ML IV SOLN
INTRAVENOUS | Status: AC
  Filled 2015-03-07: qty 5

## 2015-03-07 MED ORDER — 0.9 % SODIUM CHLORIDE (POUR BTL) OPTIME
TOPICAL | Status: DC | PRN
  Administered 2015-03-07: 1000 mL

## 2015-03-07 MED ORDER — NEOSTIGMINE METHYLSULFATE 10 MG/10ML IV SOLN
INTRAVENOUS | Status: AC
Start: 2015-03-07 — End: 2015-03-07
  Filled 2015-03-07: qty 1

## 2015-03-07 MED ORDER — BUPIVACAINE LIPOSOME 1.3 % IJ SUSP
20.0000 mL | Freq: Once | INTRAMUSCULAR | Status: AC
  Administered 2015-03-07: 20 mL
  Filled 2015-03-07: qty 20

## 2015-03-07 MED ORDER — NEOSTIGMINE METHYLSULFATE 10 MG/10ML IV SOLN
INTRAVENOUS | Status: DC | PRN
  Administered 2015-03-07: 3 mg via INTRAVENOUS
  Administered 2015-03-07: 2 mg via INTRAVENOUS

## 2015-03-07 MED ORDER — PHENYLEPHRINE HCL 10 MG/ML IJ SOLN
INTRAMUSCULAR | Status: DC | PRN
  Administered 2015-03-07: 40 ug via INTRAVENOUS

## 2015-03-07 MED ORDER — DEXAMETHASONE SODIUM PHOSPHATE 4 MG/ML IJ SOLN
INTRAMUSCULAR | Status: DC | PRN
  Administered 2015-03-07: 10 mg via INTRAVENOUS

## 2015-03-07 MED ORDER — MORPHINE SULFATE 10 MG/ML IJ SOLN
2.0000 mg | INTRAMUSCULAR | Status: DC | PRN
  Administered 2015-03-07: 4 mg via INTRAVENOUS
  Administered 2015-03-07 (×3): 3 mg via INTRAVENOUS
  Administered 2015-03-07: 4 mg via INTRAVENOUS
  Administered 2015-03-07: 3 mg via INTRAVENOUS
  Administered 2015-03-08 – 2015-03-09 (×9): 4 mg via INTRAVENOUS
  Filled 2015-03-07 (×14): qty 1

## 2015-03-07 MED ORDER — GLYCOPYRROLATE 0.2 MG/ML IJ SOLN
INTRAMUSCULAR | Status: DC | PRN
  Administered 2015-03-07: 0.1 mg via INTRAVENOUS
  Administered 2015-03-07: 0.4 mg via INTRAVENOUS
  Administered 2015-03-07: 0.2 mg via INTRAVENOUS

## 2015-03-07 MED ORDER — CISATRACURIUM BESYLATE 20 MG/10ML IV SOLN
INTRAVENOUS | Status: AC
  Filled 2015-03-07: qty 10

## 2015-03-07 MED ORDER — LACTATED RINGERS IV SOLN
INTRAVENOUS | Status: DC | PRN
  Administered 2015-03-07 (×3): via INTRAVENOUS

## 2015-03-07 MED ORDER — ACETAMINOPHEN 160 MG/5ML PO SOLN: 325.0000 mg | ORAL | Status: DC | PRN

## 2015-03-07 MED ORDER — TISSEEL VH 10 ML EX KIT
PACK | CUTANEOUS | Status: AC
  Filled 2015-03-07: qty 2

## 2015-03-07 MED ORDER — ONDANSETRON HCL 4 MG/2ML IJ SOLN
INTRAMUSCULAR | Status: AC
  Filled 2015-03-07: qty 2

## 2015-03-07 MED ORDER — KCL IN DEXTROSE-NACL 20-5-0.45 MEQ/L-%-% IV SOLN
INTRAVENOUS | Status: AC
  Filled 2015-03-07: qty 1000

## 2015-03-07 MED ORDER — METOCLOPRAMIDE HCL 5 MG/ML IJ SOLN
INTRAMUSCULAR | Status: AC
  Filled 2015-03-07: qty 2

## 2015-03-07 MED ORDER — LACTATED RINGERS IV SOLN: INTRAVENOUS | Status: DC

## 2015-03-07 MED ORDER — METOCLOPRAMIDE HCL 5 MG/ML IJ SOLN
INTRAMUSCULAR | Status: DC | PRN
Start: 2015-03-07 — End: 2015-03-07
  Administered 2015-03-07: 10 mg via INTRAVENOUS

## 2015-03-07 MED ORDER — DIPHENHYDRAMINE HCL 50 MG/ML IJ SOLN
INTRAMUSCULAR | Status: AC
  Filled 2015-03-07: qty 1

## 2015-03-07 MED ORDER — PROPOFOL 10 MG/ML IV BOLUS
INTRAVENOUS | Status: AC
  Filled 2015-03-07: qty 20

## 2015-03-07 MED ORDER — PROPOFOL 10 MG/ML IV BOLUS
INTRAVENOUS | Status: DC | PRN
  Administered 2015-03-07: 200 mg via INTRAVENOUS

## 2015-03-07 MED ORDER — MORPHINE SULFATE 10 MG/ML IJ SOLN
INTRAMUSCULAR | Status: AC
  Filled 2015-03-07: qty 1

## 2015-03-07 SURGICAL SUPPLY — 68 items
APPLICATOR COTTON TIP 6IN STRL (MISCELLANEOUS) IMPLANT
APPLIER CLIP ROT 10 11.4 M/L (STAPLE) ×3
BLADE SURG SZ11 CARB STEEL (BLADE) ×6 IMPLANT
CABLE HIGH FREQUENCY MONO STRZ (ELECTRODE) ×3 IMPLANT
CHLORAPREP W/TINT 26ML (MISCELLANEOUS) ×3 IMPLANT
CLIP APPLIE ROT 10 11.4 M/L (STAPLE) ×1 IMPLANT
CLIP SUT LAPRA TY ABSORB (SUTURE) ×6 IMPLANT
CUTTER FLEX LINEAR 45M (STAPLE) ×3 IMPLANT
DERMABOND ADVANCED (GAUZE/BANDAGES/DRESSINGS) ×4
DERMABOND ADVANCED .7 DNX12 (GAUZE/BANDAGES/DRESSINGS) ×2 IMPLANT
DEVICE SUTURE ENDOST 10MM (ENDOMECHANICALS) ×3 IMPLANT
DRAIN PENROSE 18X1/4 LTX STRL (WOUND CARE) ×3 IMPLANT
DRAPE CAMERA CLOSED 9X96 (DRAPES) ×3 IMPLANT
ELECT REM PT RETURN 9FT ADLT (ELECTROSURGICAL) ×3
ELECTRODE REM PT RTRN 9FT ADLT (ELECTROSURGICAL) ×1 IMPLANT
GAUZE SPONGE 4X4 12PLY STRL (GAUZE/BANDAGES/DRESSINGS) IMPLANT
GAUZE SPONGE 4X4 16PLY XRAY LF (GAUZE/BANDAGES/DRESSINGS) IMPLANT
GLOVE BIO SURGEON STRL SZ 6.5 (GLOVE) ×2 IMPLANT
GLOVE BIO SURGEONS STRL SZ 6.5 (GLOVE) ×1
GLOVE BIOGEL M STRL SZ7.5 (GLOVE) ×3 IMPLANT
GLOVE BIOGEL PI IND STRL 7.0 (GLOVE) ×1 IMPLANT
GLOVE BIOGEL PI IND STRL 7.5 (GLOVE) ×3 IMPLANT
GLOVE BIOGEL PI INDICATOR 7.0 (GLOVE) ×2
GLOVE BIOGEL PI INDICATOR 7.5 (GLOVE) ×6
GOWN STRL REUS W/TWL XL LVL3 (GOWN DISPOSABLE) ×15 IMPLANT
HOVERMATT SINGLE USE (MISCELLANEOUS) ×3 IMPLANT
KIT BASIN OR (CUSTOM PROCEDURE TRAY) ×3 IMPLANT
KIT GASTRIC LAVAGE 34FR ADT (SET/KITS/TRAYS/PACK) ×3 IMPLANT
LIQUID BAND (GAUZE/BANDAGES/DRESSINGS) IMPLANT
NEEDLE SPNL 22GX3.5 QUINCKE BK (NEEDLE) ×3 IMPLANT
PACK CARDIOVASCULAR III (CUSTOM PROCEDURE TRAY) ×3 IMPLANT
PEN SKIN MARKING BROAD (MISCELLANEOUS) ×3 IMPLANT
RELOAD 45 VASCULAR/THIN (ENDOMECHANICALS) IMPLANT
RELOAD ENDO STITCH 2.0 (ENDOMECHANICALS) ×18
RELOAD STAPLE TA45 3.5 REG BLU (ENDOMECHANICALS) ×3 IMPLANT
RELOAD STAPLER BLUE 60MM (STAPLE) ×3 IMPLANT
RELOAD STAPLER GOLD 60MM (STAPLE) ×1 IMPLANT
RELOAD STAPLER WHITE 60MM (STAPLE) ×2 IMPLANT
SCISSORS LAP 5X45 EPIX DISP (ENDOMECHANICALS) ×3 IMPLANT
SEALANT SURGICAL APPL DUAL CAN (MISCELLANEOUS) ×3 IMPLANT
SET IRRIG TUBING LAPAROSCOPIC (IRRIGATION / IRRIGATOR) ×3 IMPLANT
SHEARS HARMONIC ACE PLUS 45CM (MISCELLANEOUS) ×3 IMPLANT
SLEEVE ADV FIXATION 12X100MM (TROCAR) ×6 IMPLANT
SLEEVE XCEL OPT CAN 5 100 (ENDOMECHANICALS) IMPLANT
SOLUTION ANTI FOG 6CC (MISCELLANEOUS) ×3 IMPLANT
STAPLER ECHELON BIOABSB 60 FLE (MISCELLANEOUS) ×3 IMPLANT
STAPLER ECHELON LONG 60 440 (INSTRUMENTS) ×3 IMPLANT
STAPLER RELOAD BLUE 60MM (STAPLE) ×9
STAPLER RELOAD GOLD 60MM (STAPLE) ×3
STAPLER RELOAD WHITE 60MM (STAPLE) ×6
STAPLER VISISTAT 35W (STAPLE) IMPLANT
SUT MNCRL AB 4-0 PS2 18 (SUTURE) ×3 IMPLANT
SUT RELOAD ENDO STITCH 2 48X1 (ENDOMECHANICALS) ×4
SUT RELOAD ENDO STITCH 2.0 (ENDOMECHANICALS) ×5
SUT VIC AB 2-0 SH 27 (SUTURE) ×2
SUT VIC AB 2-0 SH 27X BRD (SUTURE) ×1 IMPLANT
SUTURE RELOAD END STTCH 2 48X1 (ENDOMECHANICALS) ×4 IMPLANT
SUTURE RELOAD ENDO STITCH 2.0 (ENDOMECHANICALS) ×5 IMPLANT
SYR 20CC LL (SYRINGE) ×6 IMPLANT
TOWEL OR 17X26 10 PK STRL BLUE (TOWEL DISPOSABLE) ×3 IMPLANT
TOWEL OR NON WOVEN STRL DISP B (DISPOSABLE) ×3 IMPLANT
TRAY FOLEY W/METER SILVER 14FR (SET/KITS/TRAYS/PACK) ×6 IMPLANT
TROCAR ADV FIXATION 12X100MM (TROCAR) ×3 IMPLANT
TROCAR ADV FIXATION 5X100MM (TROCAR) ×3 IMPLANT
TROCAR BLADELESS OPT 5 100 (ENDOMECHANICALS) ×3 IMPLANT
TROCAR XCEL 12X100 BLDLESS (ENDOMECHANICALS) ×3 IMPLANT
TUBING ENDO SMARTCAP PENTAX (MISCELLANEOUS) ×3 IMPLANT
TUBING FILTER THERMOFLATOR (ELECTROSURGICAL) ×3 IMPLANT

## 2015-03-07 NOTE — Anesthesia Preprocedure Evaluation (Signed)
Anesthesia Evaluation  Patient identified by MRN, date of birth, ID band Patient awake    Reviewed: Allergy & Precautions, NPO status , Patient's Chart, lab work & pertinent test results  Airway Mallampati: II  TM Distance: >3 FB Neck ROM: Full    Dental no notable dental hx.    Pulmonary neg pulmonary ROS,  breath sounds clear to auscultation  Pulmonary exam normal       Cardiovascular negative cardio ROS Normal cardiovascular examRhythm:Regular Rate:Normal     Neuro/Psych negative neurological ROS  negative psych ROS   GI/Hepatic negative GI ROS, Neg liver ROS,   Endo/Other  Morbid obesity  Renal/GU negative Renal ROS  negative genitourinary   Musculoskeletal negative musculoskeletal ROS (+)   Abdominal   Peds negative pediatric ROS (+)  Hematology negative hematology ROS (+)   Anesthesia Other Findings   Reproductive/Obstetrics negative OB ROS                             Anesthesia Physical Anesthesia Plan  ASA: II  Anesthesia Plan: General   Post-op Pain Management:    Induction: Intravenous  Airway Management Planned: Oral ETT  Additional Equipment:   Intra-op Plan:   Post-operative Plan: Extubation in OR  Informed Consent: I have reviewed the patients History and Physical, chart, labs and discussed the procedure including the risks, benefits and alternatives for the proposed anesthesia with the patient or authorized representative who has indicated his/her understanding and acceptance.   Dental advisory given  Plan Discussed with: CRNA  Anesthesia Plan Comments:         Anesthesia Quick Evaluation

## 2015-03-07 NOTE — Anesthesia Postprocedure Evaluation (Signed)
  Anesthesia Post-op Note  Patient: Catherine Washington  Procedure(s) Performed: Procedure(s) (LRB): LAPAROSCOPIC ROUX-EN-Y GASTRIC BYPASS WITH UPPER ENDOSCOPY (N/A)  Patient Location: PACU  Anesthesia Type: General  Level of Consciousness: awake and alert   Airway and Oxygen Therapy: Patient Spontanous Breathing  Post-op Pain: mild  Post-op Assessment: Post-op Vital signs reviewed, Patient's Cardiovascular Status Stable, Respiratory Function Stable, Patent Airway and No signs of Nausea or vomiting  Last Vitals:  Filed Vitals:   03/07/15 1230  BP: 148/85  Pulse: 105  Temp:   Resp: 9    Post-op Vital Signs: stable   Complications: No apparent anesthesia complications

## 2015-03-07 NOTE — Op Note (Signed)
Catherine Washington [id number] 05-Feb-1976 03/07/2015  Preoperative diagnosis: morbid obesity for roux en Y gastric bypass  Postoperative diagnosis: Same   Procedure: Upper endoscopy   Surgeon: Catalina Antigua B. Hassell Done  M.D., FACS   Anesthesia: Gen.   Indications for procedure: This patient was undergoing a lap roux en y gastric bypass.  The purpose was to check for bleeding or leaks in the pouch.    Description of procedure: The endoscopy was placed in the mouth and into the oropharynx and under endoscopic vision it was advanced to the esophagogastric junction.  The pouch was insufflated and inspected.  The pouch was about 3-4 cm in length.  .   No bleeding or leaks were detected.  The scope was withdrawn without difficulty.     Matt B. Hassell Done, MD, FACS General, Bariatric, & Minimally Invasive Surgery [facility], [place]

## 2015-03-07 NOTE — Progress Notes (Signed)
Flutter not needed at this time. Patient clear on ausculation. RN will notify if change in patient.

## 2015-03-07 NOTE — Op Note (Signed)
KADYN CHOVAN [id number] [birth year]. 03/07/2015  Preoperative diagnosis:  1. Morbid obesity (BMI 41.5)  2. Hypercholesterolemia 3. Hypertriglyceridemia 4.Depression  Postoperative  diagnosis:  1. same  Surgical procedure: Laparoscopic Roux-en-Y gastric bypass (ante-colic, ante-gastric); upper endoscopy  Surgeon: Gayland Curry, M.D. FACS  Asst.: Johnathan Hausen MD FACS  Anesthesia: General plus 95% exparel  Complications: None   EBL: Minimal   Drains: None   Disposition: PACU in good condition   Indications for procedure: 39yo WF with morbid obesity who has been unsuccessful at sustained weight loss. The patient's comorbidities are listed above. We discussed the risk and benefits of surgery including but not limited to anesthesia risk, bleeding, infection, blood clot formation, anastomotic leak, anastomotic stricture, ulcer formation, death, respiratory complications, intestinal blockage, internal hernia, gallstone formation, vitamin and nutritional deficiencies, injury to surrounding structures, failure to lose weight and mood changes.   Description of procedure: Patient is brought to the operating room and general anesthesia induced. The patient had received preoperative broad-spectrum IV antibiotics and subcutaneous heparin. The abdomen was widely sterilely prepped with Chloraprep and draped. Patient timeout was performed and correct patient and procedure confirmed. Access was obtained with a 12 mm Optiview trocar in the left upper quadrant and pneumoperitoneum established without difficulty. Under direct vision 12 mm trocars were placed laterally in the right upper quadrant, right upper quadrant midclavicular line, and to the left and above the umbilicus for the camera port. A 5 mm trocar was placed laterally in the left upper quadrant.  A 40 cm biliopancreatic limb was then carefully measured from the ligament of Treitz. The small intestine was divided at this point with a single  firing of the white load linear stapler. A Penrose drain was sutured to the end of the Roux-en-Y limb for later identification. A 100 cm Roux-en-Y limb was then carefully measured. At this point a side-to-side anastomosis was created between the Roux limb and the end of the biliopancreatic limb. This was accomplished with a single firing of the 60 mm white load linear stapler. The common enterotomy was closed with a running 2-0 Vicryl begun at either end of the enterotomy and tied centrally. Tisseel tissue sealant was placed over the anastomosis. The mesenteric defect was then closed with running 2-0 silk. The omentum was then divided with the harmonic scalpel up towards the transverse colon to allow mobility of the Roux limb toward the gastric pouch. The patient was then placed in steep reversed Trendelenburg. Through a 5 mm subxiphoid site the [facility] retractor was placed and the left lobe of the liver elevated with excellent exposure of the upper stomach and hiatus. The angle of Hiss was then mobilized with the harmonic scalpel. A 4 cm gastric pouch was then carefully measured along the lesser curve of the stomach. Dissection was carried along the lesser curve at this point with the Harmonic scalpel working carefully back toward the lesser sac at right angles to the lesser curve. The free lesser sac was then entered. After being sure all tubes were removed from the stomach an initial firing of the gold load 60 mm linear stapler was fired at right angles across the lesser curve for about 4 cm. The gastric pouch was further mobilized posteriorly and then the pouch was completed with 3 further firings of the 60 mm blue load linear stapler up through the previously dissected angle of His. [The 2nd blue 40mm load had seamguard loaded on it, while the staples went thru the stomach at the angle  of his, it did not completely cut it; however there was seamguard remaining. A final 60 blue load was used the transect the  remaining staple line]. It was ensured that the pouch was completely mobilized away from the gastric remnant. This created a nice tubular 4-5 cm gastric pouch. The Roux limb was then brought up in an antecolic fashion with the candycane facing to the patient's left without undue tension. The gastrojejunostomy was created with an initial posterior row of 2-0 Vicryl between the Roux limb and the staple line of the gastric pouch. Enterotomies were then made in the gastric pouch and the Roux limb with the harmonic scalpel and at approximately 2-2-1/2 cm anastomosis was created with a single firing of the 71mm blue load linear stapler. The staple line was inspected and was intact without bleeding. The common enterotomy was then closed with running 2-0 Vicryl begun at either end and tied centrally. The Ewall tube was then easily passed through the anastomosis and an outer anterior layer of running 2-0 Vicryl was placed. The Ewald tube was removed. With the outlet of the gastrojejunostomy clamped and under saline irrigation the assistant performed upper endoscopy and with the gastric pouch tensely distended with air-there was no evidence of leak on this test. The pouch was desufflated. The Terance Hart defect was closed with running 2-0 silk. The abdomen was inspected for any evidence of bleeding or bowel injury and everything looked fine. The Nathanson retractor was removed under direct vision after coating the anastomosis with Tisseel tissue sealant. Exparel was infiltrated around all trocar sites.  All CO2 was evacuated and trochars removed. Skin incisions were closed with 4-0 monocryl in a subcuticular fashion followed by Dermabond. Sponge needle and instrument counts were correct. The patient was taken to the PACU in good condition.    [name]. [name], MD, FACS General, Bariatric, & Minimally Invasive Surgery [facility], [place]

## 2015-03-07 NOTE — H&P (View-Only) (Signed)
Catherine Washington 02/17/2015 11:47 AM Location: [facility] Patient #: [id number] DOB: 07/03/1976 Married / Language: English / Race: White Female History of Present Illness ( M.  MD; 02/17/2015 1:30 PM) Patient words: bariatric.  The patient is a 38 year old female who presents for a pre-op visit. She has been evaluated and approved for laparoscopic Roux-en-Y gastric bypass surgery. I initially met her on November 11, 2014. Her weight at that time was 236 pounds. She denies any changes since I last saw her. She denies any trips to the hospital in the emergency room. She denies any new medications or allergies. She denies any blood thinners. Her workup was unremarkable. Her upper GI was within normal limits. Her chest x-ray and EKG were within normal limits. Her H. pylori breath test was negative. Her lab work was essentially unremarkable except for total cholesterol level of 225, triglyceride level of 268, LDL level of 121. Her vitamin D level was low at 22.4. She is taking supplemental vitamin D. Her hemoglobin A1c was 5.5 Problem List/Past Medical ( M , MD; 02/17/2015 1:30 PM) DEPRESSION, CONTROLLED (311  F32.9) HYPERCHOLESTEROLEMIA (272.0  E78.0) OBESITY, MORBID, BMI 40.0-49.9 (278.01  E66.01) VITAMIN D DEFICIENCY (268.9  E55.9)  Other Problems ( M , MD; 02/17/2015 1:30 PM) Migraine Headache Cholelithiasis  Past Surgical History ( M , MD; 02/17/2015 1:30 PM) Hysterectomy (not due to cancer) - Partial Gallbladder Surgery - Laparoscopic Foot Surgery Bilateral.  Diagnostic Studies History ( M , MD; 02/17/2015 1:30 PM) Mammogram never Colonoscopy never Pap Smear 1-5 years ago  Allergies (Sonya Bynum, CMA; 02/17/2015 11:48 AM) Nitrofurantoin Monohyd Macro *URINARY ANTI-INFECTIVES* Vomiting. Adhesive Tape Rash. surgical tape  Medication History ( M , MD; 02/17/2015 1:30 PM) BuPROPion HCl ER (XL)  (300MG Tablet ER 24HR, Oral) Active. Cholestyramine (4GM Packet, Oral) Active. Sertraline HCl (50MG Tablet, Oral) Active. ZyrTEC Allergy (10MG Tablet, Oral) Active. Multivitamin (Oral) Active. Medications Reconciled OxyCODONE HCl (5MG/5ML Solution, 5-10 Milliliter Oral every four hours, as needed, Taken starting 02/17/2015) Active.  Social History ( M , MD; 02/17/2015 1:30 PM) Caffeine use Carbonated beverages. Alcohol use Remotely quit alcohol use. Tobacco use Never smoker. No drug use  Family History ( M , MD; 02/17/2015 1:30 PM) Diabetes Mellitus Father, Mother. Depression Mother, Son. Respiratory Condition Father. Migraine Headache Son. Thyroid problems Father, Mother. Heart disease in female family member before age 55 Heart Disease Father. Melanoma Father. Hypertension Father.  Pregnancy / Birth History ( M , MD; 02/17/2015 1:30 PM) Contraceptive History Intrauterine device, Oral contraceptives. Age at menarche 12 years. Irregular periods Gravida 2 Para 2 Maternal age 15-20     Review of Systems ( M , MD; 02/17/2015 1:28 00) General Not Present- Appetite Loss, Chills, Fatigue, Fever, Night Sweats, Weight Gain and Weight Loss. Skin Not Present- Change in Wart/Mole, Dryness, Hives, Jaundice, New Lesions, Non-Healing Wounds, Rash and Ulcer. HEENT Present- Seasonal Allergies. Not Present- Earache, Hearing Loss, Hoarseness, Nose Bleed, Oral Ulcers, Ringing in the Ears, Sinus Pain, Sore Throat, Visual Disturbances, Wears glasses/contact lenses and Yellow Eyes. Respiratory Not Present- Bloody sputum, Chronic Cough, Difficulty Breathing, Snoring and Wheezing. Breast Not Present- Breast Mass, Breast Pain, Nipple Discharge and Skin Changes. Cardiovascular Not Present- Chest Pain, Difficulty Breathing Lying Down, Leg Cramps, Palpitations, Rapid Heart Rate, Shortness of Breath and Swelling of Extremities. Gastrointestinal Not  Present- Abdominal Pain, Bloating, Bloody Stool, Change in Bowel Habits, Chronic diarrhea, Constipation, Difficulty Swallowing, Excessive gas, Gets full quickly at meals, Hemorrhoids, Indigestion, Nausea, Rectal Pain   and Vomiting. Female Genitourinary Not Present- Frequency, Nocturia, Painful Urination, Pelvic Pain and Urgency. Musculoskeletal Not Present- Back Pain, Joint Pain, Joint Stiffness, Muscle Pain, Muscle Weakness and Swelling of Extremities. Neurological Not Present- Decreased Memory, Fainting, Headaches, Numbness, Seizures, Tingling, Tremor, Trouble walking and Weakness. Psychiatric Present- Depression. Not Present- Anxiety, Bipolar, Change in Sleep Pattern, Fearful and Frequent crying. Endocrine Not Present- Cold Intolerance, Excessive Hunger, Hair Changes, Heat Intolerance, Hot flashes and New Diabetes. Hematology Not Present- Easy Bruising, Excessive bleeding, Gland problems, HIV and Persistent Infections.  Vitals (Sonya Bynum CMA; 02/17/2015 11:47 AM) 02/17/2015 11:47 AM Weight: 242.2 lb Height: 64in Body Surface Area: 2.23 m Body Mass Index: 41.57 kg/m Temp.: 97.5F(Temporal)  Pulse: 79 (Regular)  BP: 128/82 (Sitting, Left Arm, Standard)     Physical Exam ( M.  MD; 02/17/2015 1:27 PM)  General Mental Status-Alert. General Appearance-Consistent with stated age. Hydration-Well hydrated. Voice-Normal. Note: morbidly obese, evenly distributed   Head and Neck Head-normocephalic, atraumatic with no lesions or palpable masses. Trachea-midline. Thyroid Gland Characteristics - normal size and consistency.  Eye Eyeball - Bilateral-Extraocular movements intact. Sclera/Conjunctiva - Bilateral-No scleral icterus.  Chest and Lung Exam Chest and lung exam reveals -quiet, even and easy respiratory effort with no use of accessory muscles and on auscultation, normal breath sounds, no adventitious sounds and normal vocal  resonance. Inspection Chest Wall - Normal. Back - normal.  Breast - Did not examine.  Cardiovascular Cardiovascular examination reveals -normal heart sounds, regular rate and rhythm with no murmurs and normal pedal pulses bilaterally.  Abdomen Inspection Inspection of the abdomen reveals - No Hernias. Skin - Scar - no surgical scars. Palpation/Percussion Palpation and Percussion of the abdomen reveal - Soft, Non Tender, No Rebound tenderness, No Rigidity (guarding) and No hepatosplenomegaly. Auscultation Auscultation of the abdomen reveals - Bowel sounds normal. Note: old trocar scars   Peripheral Vascular Upper Extremity Palpation - Pulses bilaterally normal.  Neurologic Neurologic evaluation reveals -alert and oriented x 3 with no impairment of recent or remote memory. Mental Status-Normal.  Neuropsychiatric The patient's mood and affect are described as -normal. Judgment and Insight-insight is appropriate concerning matters relevant to self.  Musculoskeletal Normal Exam - Left-Upper Extremity Strength Normal and Lower Extremity Strength Normal. Normal Exam - Right-Upper Extremity Strength Normal and Lower Extremity Strength Normal.  Lymphatic Head & Neck  General Head & Neck Lymphatics: Bilateral - Description - Normal. Axillary - Did not examine. Femoral & Inguinal - Did not examine.    Assessment & Plan ( M.  MD; 02/17/2015 1:30 PM)  OBESITY, MORBID, BMI 40.0-49.9 (278.01  E66.01) Impression: We reviewed her preoperative workup. We discussed her lab results. All of her questions were asked and answered. We discussed the importance of the preoperative diet. She was given her postoperative pain medicine prescription today. She was also given instructions for her bowel prep. I encouraged her to contact the office should she have any questions before surgery. Her surgery scheduled for June 20  Current Plans Pt Education -  EMW_preopbariatric Started OxyCODONE HCl 5MG/5ML, 5-10 Milliliter every four hours, as needed, 200 Milliliter, 02/17/2015, No Refill. HYPERCHOLESTEROLEMIA (272.0  E78.0)  DEPRESSION, CONTROLLED (311  F32.9)  VITAMIN D DEFICIENCY (268.9  E55.9)   M. , MD, FACS General, Bariatric, & Minimally Invasive Surgery [facility]  

## 2015-03-07 NOTE — Anesthesia Procedure Notes (Addendum)
Procedure Name: Intubation Date/Time: 03/07/2015 7:41 AM Performed by: Deliah Boston Pre-anesthesia Checklist: Patient identified, Emergency Drugs available, Suction available and Patient being monitored Patient Re-evaluated:Patient Re-evaluated prior to inductionOxygen Delivery Method: Circle System Utilized Preoxygenation: Pre-oxygenation with 100% oxygen Intubation Type: IV induction Ventilation: Mask ventilation without difficulty Laryngoscope Size: Mac and 3 Grade View: Grade III Tube type: Oral Tube size: 7.0 mm Number of attempts: 2 Airway Equipment and Method: Oral airway and Bougie stylet Placement Confirmation: ETT inserted through vocal cords under direct vision,  positive ETCO2 and breath sounds checked- equal and bilateral Secured at: 21 cm Tube secured with: Tape Dental Injury: Teeth and Oropharynx as per pre-operative assessment

## 2015-03-07 NOTE — Transfer of Care (Signed)
Immediate Anesthesia Transfer of Care Note  Patient: Catherine Washington  Procedure(s) Performed: Procedure(s): LAPAROSCOPIC ROUX-EN-Y GASTRIC BYPASS WITH UPPER ENDOSCOPY (N/A)  Patient Location: PACU  Anesthesia Type:General  Level of Consciousness: Patient easily awoken, sedated, comfortable, cooperative, following commands, responds to stimulation.   Airway & Oxygen Therapy: Patient spontaneously breathing, ventilating well, oxygen via simple oxygen mask.  Post-op Assessment: Report given to PACU RN, vital signs reviewed and stable, moving all extremities.   Post vital signs: Reviewed and stable.  Complications: No apparent anesthesia complications

## 2015-03-07 NOTE — Interval H&P Note (Signed)
History and Physical Interval Note:  03/07/2015 7:11 AM  Catherine Washington  has presented today for surgery, with the diagnosis of Morbid Obesity  The various methods of treatment have been discussed with the patient and family. After consideration of risks, benefits and other options for treatment, the patient has consented to  Procedure(s): LAPAROSCOPIC ROUX-EN-Y GASTRIC BYPASS WITH UPPER ENDOSCOPY (N/A) as a surgical intervention .  The patient's history has been reviewed, patient examined, no change in status, stable for surgery.  I have reviewed the patient's chart and labs.  Questions were answered to the patient's satisfaction.    [name]. [name], MD, [place], Bariatric, & Minimally Invasive Surgery [facility], [place]   [facility]

## 2015-03-08 ENCOUNTER — Encounter (HOSPITAL_COMMUNITY): Payer: Self-pay | Admitting: General Surgery

## 2015-03-08 ENCOUNTER — Inpatient Hospital Stay (HOSPITAL_COMMUNITY): Payer: BC Managed Care – PPO

## 2015-03-08 LAB — CBC WITH DIFFERENTIAL/PLATELET
Basophils Absolute: 0 10*3/uL (ref 0.0–0.1)
Basophils Relative: 0 % (ref 0–1)
Eosinophils Absolute: 0.1 10*3/uL (ref 0.0–0.7)
Eosinophils Relative: 1 % (ref 0–5)
HEMATOCRIT: 35.9 % — AB (ref 36.0–46.0)
HEMOGLOBIN: 11.8 g/dL — AB (ref 12.0–15.0)
LYMPHS ABS: 2.9 10*3/uL (ref 0.7–4.0)
Lymphocytes Relative: 25 % (ref 12–46)
MCH: 28.2 pg (ref 26.0–34.0)
MCHC: 32.9 g/dL (ref 30.0–36.0)
MCV: 85.7 fL (ref 78.0–100.0)
Monocytes Absolute: 1 10*3/uL (ref 0.1–1.0)
Monocytes Relative: 9 % (ref 3–12)
NEUTROS PCT: 65 % (ref 43–77)
Neutro Abs: 7.5 10*3/uL (ref 1.7–7.7)
PLATELETS: 402 10*3/uL — AB (ref 150–400)
RBC: 4.19 MIL/uL (ref 3.87–5.11)
RDW: 14.3 % (ref 11.5–15.5)
WBC: 11.6 10*3/uL — AB (ref 4.0–10.5)

## 2015-03-08 LAB — COMPREHENSIVE METABOLIC PANEL
ALK PHOS: 52 U/L (ref 38–126)
ALT: 24 U/L (ref 14–54)
AST: 25 U/L (ref 15–41)
Albumin: 3.8 g/dL (ref 3.5–5.0)
Anion gap: 6 (ref 5–15)
BILIRUBIN TOTAL: 0.5 mg/dL (ref 0.3–1.2)
BUN: 6 mg/dL (ref 6–20)
CALCIUM: 8.7 mg/dL — AB (ref 8.9–10.3)
CO2: 26 mmol/L (ref 22–32)
Chloride: 104 mmol/L (ref 101–111)
Creatinine, Ser: 0.85 mg/dL (ref 0.44–1.00)
GFR calc Af Amer: >60 mL/min (ref >60)
GFR calc non Af Amer: >60 mL/min (ref >60)
GLUCOSE: 125 mg/dL — AB (ref 65–99)
Potassium: 3.8 mmol/L (ref 3.5–5.1)
SODIUM: 136 mmol/L (ref 135–145)
Total Protein: 6.7 g/dL (ref 6.5–8.1)

## 2015-03-08 LAB — HEMOGLOBIN AND HEMATOCRIT, BLOOD
HCT: 33.4 % — ABNORMAL LOW (ref 36.0–46.0)
HEMOGLOBIN: 10.7 g/dL — AB (ref 12.0–15.0)

## 2015-03-08 MED ORDER — IOHEXOL 300 MG/ML  SOLN
50.0000 mL | Freq: Once | INTRAMUSCULAR | Status: AC | PRN
Start: 2015-03-08 — End: 2015-03-08
  Administered 2015-03-08: 50 mL via ORAL

## 2015-03-08 NOTE — Plan of Care (Signed)
Problem: Food- and Nutrition-Related Knowledge Deficit (NB-1.1) Goal: Nutrition education Formal process to instruct or train a patient/client in a skill or to impart knowledge to help patients/clients voluntarily manage or modify food choices and eating behavior to maintain or improve health. Outcome: Progressing  RD consulted for nutrition education regarding gastric bypass diet.  Body mass index is 39.01 kg/(m^2). Pt meets criteria for obese based on current BMI.  RD provided "Gastric Bypass Post-op Diet" handout from [facility]. Emphasized the importance of following diet parameters. Discussed importance of controlled and consistent intake throughout the day in specified amounts.Emphasized the importance of hydration with calorie-free beverages. Encouraged pt to discuss questions or concerns with inpatient dietitian or through follow-up with outpatient dietitian.   Expect good compliance.  Patient denies questions  Current diet order is Bypass POD #1. Labs and medications reviewed. No further nutrition interventions warranted at this time. RD contact information provided. If additional nutrition issues arise, please re-consult RD.  Brynda Greathouse, MS RD LDN Clinical Inpatient Dietitian Weekend/After hours pager: [phone]

## 2015-03-08 NOTE — Progress Notes (Signed)
1 Day Post-Op  Subjective: Ambulated yesterday and today; min pain. Nausea improved.   Objective: Vital signs in last 24 hours: Temp:  [98 F (36.7 C)-99 F (37.2 C)] 98.3 F (36.8 C) (06/21 0950) Pulse Rate:  [94-113] 94 (06/21 0950) Resp:  [9-16] 16 (06/21 0950) BP: (105-149)/(52-90) 126/71 mmHg (06/21 0950) SpO2:  [98 %-100 %] 100 % (06/21 0950) Last BM Date: 03/06/15  Intake/Output from previous day: 06/20 0701 - 06/21 0700 In: 5500 [I.V.:5500] Out: 1375 [Urine:1375] Intake/Output this shift:    Alert, nontoxic, not ill appearing cta b/l Reg Soft, min TTP, incisions c/d/i No edema  Lab Results:   Recent Labs  03/07/15 1111 03/08/15 0448  WBC  --  11.6*  HGB 12.2 11.8*  HCT 36.8 35.9*  PLT  --  402*   BMET  Recent Labs  03/08/15 0448  NA 136  K 3.8  CL 104  CO2 26  GLUCOSE 125*  BUN 6  CREATININE 0.85  CALCIUM 8.7*   PT/INR No results for input(s): LABPROT, INR in the last 72 hours. ABG No results for input(s): PHART, HCO3 in the last 72 hours.  Invalid input(s): PCO2, PO2  Studies/Results: Dg Ugi W/water Sol Cm  03/08/2015   CLINICAL DATA:  Morbid obesity. Postop day 1 from gastric bypass surgery.  EXAM: WATER SOLUBLE UPPER GI SERIES  TECHNIQUE: Single-column upper GI series was performed using water soluble contrast.  CONTRAST:  62mL OMNIPAQUE IOHEXOL 300 MG/ML  SOLN  COMPARISON:  None.  FLUOROSCOPY TIME:  Fluoroscopy Time (in minutes and seconds): 1 minutes 13 seconds  Number of Acquired Images:  9  FINDINGS: Scout radiograph: Unremarkable bowel gas pattern. Surgical clips from prior cholecystectomy.  Esophagus:  No evidence of esophageal mass or stricture.  Stomach: Expected postoperative appearance of gastric pouch is seen. Prompt contrast emptying from the gastric pouch is demonstrated. There is no evidence of contrast leak or extravasation.  Small Bowel: Efferent small bowel shows no evidence of dilatation or obstruction. Jejunal fold pattern  is within normal limits. There is no evidence of contrast leak or extravasation.  Other:  None.  IMPRESSION: Expected postop appearance status post gastric bypass surgery. No evidence of postop leak, obstruction, or other complication.   Electronically Signed   By: Earle Gell M.D.   On: 03/08/2015 09:45    Anti-infectives: Anti-infectives    Start     Dose/Rate Route Frequency Ordered Stop   03/07/15 0559  cefoTEtan in Dextrose 5% (CEFOTAN) IVPB 2 g     2 g Intravenous On call to O.R. 03/07/15 0559 03/07/15 0744      Assessment/Plan: s/p Procedure(s): LAPAROSCOPIC ROUX-EN-Y GASTRIC BYPASS WITH UPPER ENDOSCOPY (N/A)  Mild tachycardia resolved. UGI looks ok.  Start POD 1 diet Ambulate, pulm toilet vte prophylaxis  Catherine Washington. [name], MD, FACS General, Bariatric, & Minimally Invasive Surgery [facility], [place]   LOS: 1 day    Gayland Curry 03/08/2015

## 2015-03-09 LAB — CBC WITH DIFFERENTIAL/PLATELET
BASOS PCT: 0 % (ref 0–1)
Basophils Absolute: 0 10*3/uL (ref 0.0–0.1)
Eosinophils Absolute: 0.6 10*3/uL (ref 0.0–0.7)
Eosinophils Relative: 7 % — ABNORMAL HIGH (ref 0–5)
HEMATOCRIT: 33.3 % — AB (ref 36.0–46.0)
Hemoglobin: 10.7 g/dL — ABNORMAL LOW (ref 12.0–15.0)
LYMPHS PCT: 27 % (ref 12–46)
Lymphs Abs: 2.4 10*3/uL (ref 0.7–4.0)
MCH: 27.8 pg (ref 26.0–34.0)
MCHC: 32.1 g/dL (ref 30.0–36.0)
MCV: 86.5 fL (ref 78.0–100.0)
MONO ABS: 0.7 10*3/uL (ref 0.1–1.0)
Monocytes Relative: 8 % (ref 3–12)
NEUTROS ABS: 5.2 10*3/uL (ref 1.7–7.7)
NEUTROS PCT: 58 % (ref 43–77)
Platelets: 365 10*3/uL (ref 150–400)
RBC: 3.85 MIL/uL — ABNORMAL LOW (ref 3.87–5.11)
RDW: 14.6 % (ref 11.5–15.5)
WBC: 9 10*3/uL (ref 4.0–10.5)

## 2015-03-09 MED ORDER — BUPROPION HCL 100 MG PO TABS: 100.0000 mg | ORAL_TABLET | Freq: Three times a day (TID) | ORAL | Status: DC

## 2015-03-09 MED ORDER — UNJURY VANILLA POWDER
2.0000 [oz_av] | Freq: Four times a day (QID) | ORAL | Status: DC
  Administered 2015-03-09: 2 [oz_av] via ORAL

## 2015-03-09 MED ORDER — OXYCODONE HCL 5 MG/5ML PO SOLN: 5.0000 mg | ORAL | Status: DC | PRN

## 2015-03-09 MED ORDER — ONDANSETRON 4 MG PO TBDP: 4.0000 mg | ORAL_TABLET | Freq: Three times a day (TID) | ORAL | Status: DC | PRN

## 2015-03-09 MED ORDER — PANTOPRAZOLE SODIUM 40 MG PO TBEC: 40.0000 mg | DELAYED_RELEASE_TABLET | Freq: Every day | ORAL | Status: DC

## 2015-03-09 MED ORDER — UNJURY CHOCOLATE CLASSIC POWDER
2.0000 [oz_av] | Freq: Four times a day (QID) | ORAL | Status: DC
  Administered 2015-03-09: 2 [oz_av] via ORAL

## 2015-03-09 MED ORDER — UNJURY CHICKEN SOUP POWDER: 2.0000 [oz_av] | Freq: Four times a day (QID) | ORAL | Status: DC

## 2015-03-09 NOTE — Discharge Summary (Signed)
Physician Discharge Summary  Catherine Washington QPY:[id number] DOB: 11-Jul-1976 DOA: 03/07/2015  PCP: Eulas Post, MD  Admit date: 03/07/2015 Discharge date: 03/09/2015  Recommendations for Outpatient Follow-up:    Follow-up Information    Follow up with Gayland Curry, MD. Go on 03/17/2015.   Specialty:  General Surgery   Why:  For Post-Op Check at 2:00 PM   Contact information:   [place] [address] [phone]      Discharge Diagnoses:  Active Problems:   Dyslipidemia   Depression   Obesity (BMI 30-39.9)   S/P gastric bypass   Surgical Procedure: Laparoscopic Roux-en-Y gastric bypass, upper endoscopy  Discharge Condition: Good Disposition: Home  Diet recommendation: Postoperative gastric bypass diet  Filed Weights   03/07/15 0533  Weight: 103.137 kg (227 lb 6 oz)     Hospital Course:  The patient was admitted for a planned laparoscopic Roux-en-Y gastric bypass. Please see operative note. Preoperatively the patient was given 5000 units of subcutaneous heparin for DVT prophylaxis. Postoperative prophylactic Lovenox dosing was started on the morning of postoperative day 1. The patient underwent an upper GI on postoperative day 1 which demonstrated no extravasation of contrast and emptying of the contrast into the Roux limb. The patient was started on ice chips and water which they tolerated. On postoperative day 2 The patient's diet was advanced to protein shakes which they also tolerated. The patient was ambulating without difficulty. Their vital signs are stable without fever or tachycardia. Their hemoglobin had remained stable. The patient had received discharge instructions and counseling. They were deemed stable for discharge.   Discharge Instructions      Discharge Instructions    Ambulate hourly while awake    Complete by:  As directed      Call MD for:  difficulty breathing, headache or visual disturbances    Complete by:  As  directed      Call MD for:  persistant dizziness or light-headedness    Complete by:  As directed      Call MD for:  persistant nausea and vomiting    Complete by:  As directed      Call MD for:  redness, tenderness, or signs of infection (pain, swelling, redness, odor or green/yellow discharge around incision site)    Complete by:  As directed      Call MD for:  severe uncontrolled pain    Complete by:  As directed      Call MD for:  temperature >101 F    Complete by:  As directed      Diet bariatric full liquid    Complete by:  As directed      Discharge instructions    Complete by:  As directed   See bariatric discharge instructions     Incentive spirometry    Complete by:  As directed   Perform hourly while awake            Medication List    STOP taking these medications        amoxicillin-clavulanate 875-125 MG per tablet  Commonly known as:  AUGMENTIN     buPROPion 300 MG 24 hr tablet  Commonly known as:  WELLBUTRIN XL  Replaced by:  buPROPion 100 MG tablet     cholestyramine 4 G packet  Commonly known as:  QUESTRAN      TAKE these medications        acetaminophen 500 MG tablet  Commonly known as:  TYLENOL  Take 1,000 mg by mouth every 6 (six) hours as needed for moderate pain.     buPROPion 100 MG tablet  Commonly known as:  WELLBUTRIN  Take 1 tablet (100 mg total) by mouth 3 (three) times daily.     cetirizine 10 MG tablet  Commonly known as:  ZYRTEC  Take 10 mg by mouth daily as needed for allergies.     cholecalciferol 1000 UNITS tablet  Commonly known as:  VITAMIN D  Take 1,000 Units by mouth daily.     multivitamin tablet  Take 1 tablet by mouth daily.     ondansetron 4 MG disintegrating tablet  Commonly known as:  ZOFRAN ODT  Take 1 tablet (4 mg total) by mouth every 8 (eight) hours as needed for nausea or vomiting.     oxyCODONE 5 MG/5ML solution  Commonly known as:  ROXICODONE  Take 5-10 mLs (5-10 mg total) by mouth every 4 (four)  hours as needed for moderate pain or severe pain.     pantoprazole 40 MG tablet  Commonly known as:  PROTONIX  Take 1 tablet (40 mg total) by mouth daily.     sertraline 50 MG tablet  Commonly known as:  ZOLOFT  TAKE ONE TABLET BY MOUTH ONE TIME DAILY       Follow-up Information    Follow up with Gayland Curry, MD. Go on 03/17/2015.   Specialty:  General Surgery   Why:  For Post-Op Check at 2:00 PM   Contact information:   [place] [address] [phone]        The results of significant diagnostics from this hospitalization (including imaging, microbiology, ancillary and laboratory) are listed below for reference.    Significant Diagnostic Studies: Dg Ugi W/water Sol Cm  03/08/2015   CLINICAL DATA:  Morbid obesity. Postop day 1 from gastric bypass surgery.  EXAM: WATER SOLUBLE UPPER GI SERIES  TECHNIQUE: Single-column upper GI series was performed using water soluble contrast.  CONTRAST:  88mL OMNIPAQUE IOHEXOL 300 MG/ML  SOLN  COMPARISON:  None.  FLUOROSCOPY TIME:  Fluoroscopy Time (in minutes and seconds): 1 minutes 13 seconds  Number of Acquired Images:  9  FINDINGS: Scout radiograph: Unremarkable bowel gas pattern. Surgical clips from prior cholecystectomy.  Esophagus:  No evidence of esophageal mass or stricture.  Stomach: Expected postoperative appearance of gastric pouch is seen. Prompt contrast emptying from the gastric pouch is demonstrated. There is no evidence of contrast leak or extravasation.  Small Bowel: Efferent small bowel shows no evidence of dilatation or obstruction. Jejunal fold pattern is within normal limits. There is no evidence of contrast leak or extravasation.  Other:  None.  IMPRESSION: Expected postop appearance status post gastric bypass surgery. No evidence of postop leak, obstruction, or other complication.   Electronically Signed   By: Earle Gell M.D.   On: 03/08/2015 09:45    Labs: Basic Metabolic Panel:  Recent  Labs Lab 03/08/15 0448  NA 136  K 3.8  CL 104  CO2 26  GLUCOSE 125*  BUN 6  CREATININE 0.85  CALCIUM 8.7*   Liver Function Tests:  Recent Labs Lab 03/08/15 0448  AST 25  ALT 24  ALKPHOS 52  BILITOT 0.5  PROT 6.7  ALBUMIN 3.8    CBC:  Recent Labs Lab 03/07/15 1111 03/08/15 0448 03/08/15 1605 03/09/15 0510  WBC  --  11.6*  --  9.0  NEUTROABS  --  7.5  --  5.2  HGB 12.2 11.8* 10.7* 10.7*  HCT 36.8 35.9* 33.4* 33.3*  MCV  --  85.7  --  86.5  PLT  --  402*  --  365    CBG: No results for input(s): GLUCAP in the last 168 hours.  Active Problems:   Dyslipidemia   Depression   Obesity (BMI 30-39.9)   S/P gastric bypass   Time coordinating discharge: 10 min  Signed:  Gayland Curry, MD [facility], [place] 03/09/2015, 10:45 AM

## 2015-03-09 NOTE — Progress Notes (Signed)
2 Days Post-Op  Subjective: Min nausea. Pain well controlled. Ambulated. Tolerated shake.   Objective: Vital signs in last 24 hours: Temp:  [97.9 F (36.6 C)-99.4 F (37.4 C)] 99.2 F (37.3 C) (06/22 0617) Pulse Rate:  [94-105] 102 (06/22 0617) Resp:  [16-18] 18 (06/22 0617) BP: (102-126)/(52-73) 107/64 mmHg (06/22 0617) SpO2:  [96 %-100 %] 97 % (06/22 0617) Last BM Date: 03/06/15  Intake/Output from previous day: 06/21 0701 - 06/22 0700 In: 3180 [P.O.:180; I.V.:3000] Out: 2000 [Urine:2000] Intake/Output this shift:    Alert, looks comfortable cta b/l Reg Soft, min TTP, incisions c/d/i, nd No edema  Lab Results:   Recent Labs  03/08/15 0448 03/08/15 1605 03/09/15 0510  WBC 11.6*  --  9.0  HGB 11.8* 10.7* 10.7*  HCT 35.9* 33.4* 33.3*  PLT 402*  --  365   BMET  Recent Labs  03/08/15 0448  NA 136  K 3.8  CL 104  CO2 26  GLUCOSE 125*  BUN 6  CREATININE 0.85  CALCIUM 8.7*   PT/INR No results for input(s): LABPROT, INR in the last 72 hours. ABG No results for input(s): PHART, HCO3 in the last 72 hours.  Invalid input(s): PCO2, PO2  Studies/Results: Dg Ugi W/water Sol Cm  03/08/2015   CLINICAL DATA:  Morbid obesity. Postop day 1 from gastric bypass surgery.  EXAM: WATER SOLUBLE UPPER GI SERIES  TECHNIQUE: Single-column upper GI series was performed using water soluble contrast.  CONTRAST:  97mL OMNIPAQUE IOHEXOL 300 MG/ML  SOLN  COMPARISON:  None.  FLUOROSCOPY TIME:  Fluoroscopy Time (in minutes and seconds): 1 minutes 13 seconds  Number of Acquired Images:  9  FINDINGS: Scout radiograph: Unremarkable bowel gas pattern. Surgical clips from prior cholecystectomy.  Esophagus:  No evidence of esophageal mass or stricture.  Stomach: Expected postoperative appearance of gastric pouch is seen. Prompt contrast emptying from the gastric pouch is demonstrated. There is no evidence of contrast leak or extravasation.  Small Bowel: Efferent small bowel shows no evidence  of dilatation or obstruction. Jejunal fold pattern is within normal limits. There is no evidence of contrast leak or extravasation.  Other:  None.  IMPRESSION: Expected postop appearance status post gastric bypass surgery. No evidence of postop leak, obstruction, or other complication.   Electronically Signed   By: Earle Gell M.D.   On: 03/08/2015 09:45    Anti-infectives: Anti-infectives    Start     Dose/Rate Route Frequency Ordered Stop   03/07/15 0559  cefoTEtan in Dextrose 5% (CEFOTAN) IVPB 2 g     2 g Intravenous On call to O.R. 03/07/15 0559 03/07/15 0744      Assessment/Plan: s/p Procedure(s): LAPAROSCOPIC ROUX-EN-Y GASTRIC BYPASS WITH UPPER ENDOSCOPY (N/A)   Looks good. Vitals ok. Labs as expected.  If does well this am will dc later today Discussed dc instructions and importance of walking  [name]. [name], MD, FACS General, Bariatric, & Minimally Invasive Surgery [facility], [place]   LOS: 2 days    Gayland Curry 03/09/2015

## 2015-03-09 NOTE — Progress Notes (Signed)
Patient alert and oriented, pain is controlled. Patient is tolerating fluids,  advanced to protein shake today, patient tolerated well. Reviewed Gastric Bypass discharge instructions with patient and patient is able to articulate understanding. Provided information on BELT program, Support Group and [facility] outpatient pharmacy. All questions answered, will continue to monitor.    

## 2015-03-09 NOTE — Discharge Instructions (Signed)

## 2015-03-09 NOTE — Care Management Note (Signed)
Case Management Note  Patient Details  Name: Catherine Washington MRN: [id number] Date of Birth: Feb 04, 1976  Subjective/Objective:                 Admitted s/p gastric bypass   Action/Plan: Discharge planning  Expected Discharge Date:                  Expected Discharge Plan:  Home/Self Care  In-House Referral:  NA  Discharge planning Services  CM Consult  Post Acute Care Choice:    Choice offered to:     DME Arranged:    DME Agency:     HH Arranged:    HH Agency:     Status of Service:  Completed, signed off  Medicare Important Message Given:  N/A - LOS <3 / Initial given by admissions Date Medicare IM Given:    Medicare IM give by:    Date Additional Medicare IM Given:    Additional Medicare Important Message give by:     If discussed at [place] of Stay Meetings, dates discussed:    Additional Comments:  Guadalupe Maple, RN 03/09/2015, 9:54 AM

## 2015-03-14 ENCOUNTER — Telehealth (HOSPITAL_COMMUNITY): Payer: Self-pay

## 2015-03-14 NOTE — Telephone Encounter (Signed)
Made discharge phone call to patient per DROP protocol. Asking the following questions.    1. Do you have someone to care for you now that you are home?  yes 2. Are you having pain now that is not relieved by your pain medication?  no 3. Are you able to drink the recommended daily amount of fluids (48 ounces minimum/day) and protein (60-80 grams/day) as prescribed by the dietitian or nutritional counselor?  No, I am getting in about 20 oz of clears, and 45 grams of protein a day 4. Are you taking the vitamins and minerals as prescribed?  yes 5. Do you have the "on call" number to contact your surgeon if you have a problem or question?  yes 6. Are your incisions free of redness, swelling or drainage? (If steri strips, address that these can fall off, shower as tolerated) yes 7. Have your bowels moved since your surgery?  If not, are you passing gas?  No, yes 8. Are you up and walking 3-4 times per day?  yes    1. Do you have an appointment made to see your surgeon in the next month?  yes 2. Were you provided your discharge medications before your surgery or before you were discharged from the hospital and are you taking them without problem?  yes 3. Were you provided phone numbers to the clinic/surgeon's office?  yes 4. Did you watch the patient education video module in the (clinic, surgeon's office, etc.) before your surgery? yes 5. Do you have a discharge checklist that was provided to you in the hospital to reference with instructions on how to take care of yourself after surgery?  yes 6. Did you see a dietitian or nutritional counselor while you were in the hospital?  yes 7. Do you have an appointment to see a dietitian or nutritional counselor in the next month? yes

## 2015-03-22 ENCOUNTER — Encounter: Payer: BC Managed Care – PPO | Attending: General Surgery

## 2015-03-22 VITALS — Ht 64.0 in | Wt 220.0 lb

## 2015-03-22 DIAGNOSIS — Z713 Dietary counseling and surveillance: Secondary | ICD-10-CM | POA: Diagnosis not present

## 2015-03-22 DIAGNOSIS — E669 Obesity, unspecified: Secondary | ICD-10-CM | POA: Diagnosis present

## 2015-03-22 DIAGNOSIS — Z6841 Body Mass Index (BMI) 40.0 and over, adult: Secondary | ICD-10-CM | POA: Insufficient documentation

## 2015-03-22 NOTE — Progress Notes (Signed)
Bariatric Class:  Appt start time: 1530 end time:  1630.  2 Week Post-Operative Nutrition Class  Patient was seen on 03/22/15 for Post-Operative Nutrition education [facility].    Surgery date: 03/07/15 Surgery type: RYGB Start weight at [facility]: 238 lbs on 12/21/14  Weight today: 220.0 lbs  Weight change: 22.5 lbs  TANITA  BODY COMP RESULTS  02/21/15 03/22/15   BMI (kg/m^2) 41.6 37.8   Fat Mass (lbs) 112 102.0   Fat Free Mass (lbs) 130.5 118.0   Total Body Water (lbs) 95.5 86.5    The following the learning objectives were met by the patient during this course:  Identifies Phase 3A (Soft, High Proteins) Dietary Goals and will begin from 2 weeks post-operatively to 2 months post-operatively  Identifies appropriate sources of fluids and proteins   States protein recommendations and appropriate sources post-operatively  Identifies the need for appropriate texture modifications, mastication, and bite sizes when consuming solids  Identifies appropriate multivitamin and calcium sources post-operatively  Describes the need for physical activity post-operatively and will follow MD recommendations  States when to call healthcare provider regarding medication questions or post-operative complications  Handouts given during class include:  Phase 3A: Soft, High Protein Diet Handout  Follow-Up Plan: Patient will follow-up at [facility] in 6 weeks for 2 month post-op nutrition visit for diet advancement per MD.

## 2015-05-03 ENCOUNTER — Encounter: Payer: BC Managed Care – PPO | Attending: General Surgery | Admitting: Dietician

## 2015-05-03 ENCOUNTER — Encounter: Payer: Self-pay | Admitting: Dietician

## 2015-05-03 VITALS — Ht 64.0 in | Wt 204.5 lb

## 2015-05-03 DIAGNOSIS — Z6841 Body Mass Index (BMI) 40.0 and over, adult: Secondary | ICD-10-CM | POA: Insufficient documentation

## 2015-05-03 DIAGNOSIS — Z713 Dietary counseling and surveillance: Secondary | ICD-10-CM | POA: Diagnosis not present

## 2015-05-03 DIAGNOSIS — E669 Obesity, unspecified: Secondary | ICD-10-CM | POA: Diagnosis present

## 2015-05-03 NOTE — Patient Instructions (Signed)
Goals:  Follow Phase 3B: High Protein + Non-Starchy Vegetables  Eat 3-6 small meals/snacks, every 3-5 hrs  Increase lean protein foods to meet 60g goal  Increase fluid intake to 64oz +  Avoid drinking 15 minutes before, during and 30 minutes after eating  Aim for >30 min of physical activity daily   TANITA  BODY COMP RESULTS  02/21/15 03/22/15 05/03/15   BMI (kg/m^2) 41.6 37.8 35.1   Fat Mass (lbs) 112 102.0 82   Fat Free Mass (lbs) 130.5 118.0 122.5   Total Body Water (lbs) 95.5 86.5 89.5

## 2015-05-03 NOTE — Progress Notes (Signed)
  Follow-up visit:  8 Weeks Post-Operative RYGB Surgery  Medical Nutrition Therapy:  Appt start time: 1000 end time:  1020  Primary concerns today: Post-operative Bariatric Surgery Nutrition Management.  Catherine Washington returns having lost 15 pounds since post op class. She reports that plain water causes abdominal pain and discomfort. Can eat up to 3oz of chicken at a time. Lunchmeat and eggs "sit heavy" but can tolerate all other recommended foods.  Surgery date: 03/07/15 Surgery type: RYGB Start weight at [facility]: 238 lbs on 12/21/14  Weight today: 204.5 lbs Weight change: 15 lbs Total weight lost: 34 lbs  TANITA  BODY COMP RESULTS  02/21/15 03/22/15 05/03/15   BMI (kg/m^2) 41.6 37.8 35.1   Fat Mass (lbs) 112 102.0 82   Fat Free Mass (lbs) 130.5 118.0 122.5   Total Body Water (lbs) 95.5 86.5 89.5    Preferred Learning Style:   No preference indicated   Learning Readiness:   Ready  24-hr recall: B (AM): [place] protein shake with water or skim milk (21g) Snk (AM):   L (PM): [place] whipped yogurt (9g) Snk (PM):   D (PM): homemade chili with lean beef OR black beans with low fat cheese and small amount of rice (14g) Snk (PM): Unjury protein shake (21g)  Fluid intake: 40-52 oz of water, Powerade Zero, and protein shake  Estimated total protein intake: 65 grams  Medications: none Supplementation: taking  Using straws: no Drinking while eating: no Hair loss: no more than usual Carbonated beverages: none N/V/D/C: some nausea but it's resolving Dumping syndrome: none  Recent physical activity:  Walking dogs daily for 15-20 minutes  Progress Towards Goal(s):  In progress.  Handouts given during visit include:  Phase 3B lean protein + non starchy vegetables   Nutritional Diagnosis:  [name]-3.3 Overweight/obesity related to past poor dietary habits and physical inactivity as evidenced by patient w/ recent RYGB surgery following dietary guidelines for continued weight loss.      Intervention:  Nutrition counseling provided.  Teaching Method Utilized:  Visual Auditory Hands on  Barriers to learning/adherence to lifestyle change: none  Demonstrated degree of understanding via:  Teach Back   Monitoring/Evaluation:  Dietary intake, exercise, and body weight. Patient does not feel like she needs to make a follow up appointment at this time. She agreed to call [facility] prn.

## 2015-08-02 IMAGING — RF DG UGI W/ GASTROGRAFIN
9 series · 9 of 9 positions shown · IV contrast (omnipaque)
Comparison: None.

CLINICAL DATA: Morbid obesity. Postop day 1 from gastric bypass
surgery.

EXAM:
WATER SOLUBLE UPPER GI SERIES
TECHNIQUE: Single-column upper GI series was performed using water soluble
contrast.
CONTRAST:  50mL OMNIPAQUE IOHEXOL 300 MG/ML  SOLN

[Series 1: run · 1 of 1 slices shown (1 of 9)]
[im 1/1]
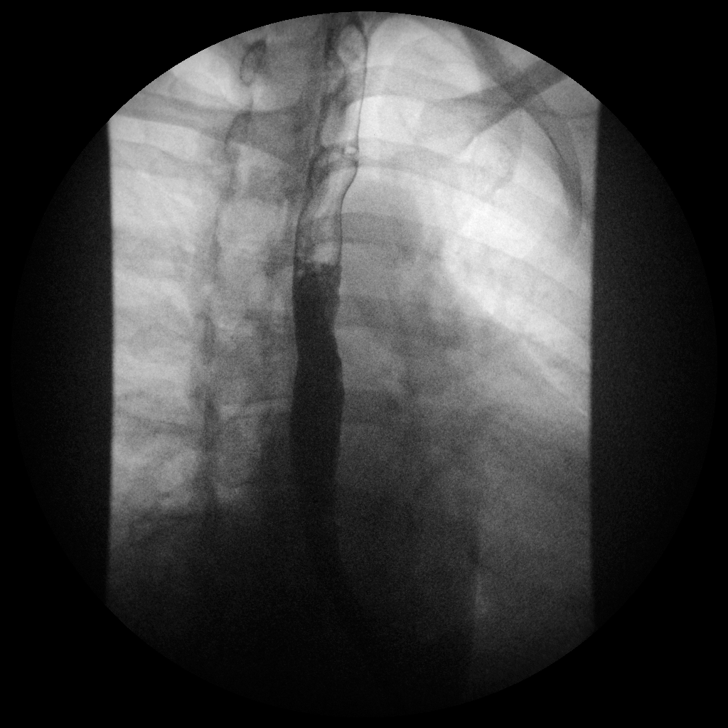

[Series 2: run · 1 of 1 slices shown (2 of 9)]
[im 1/1]
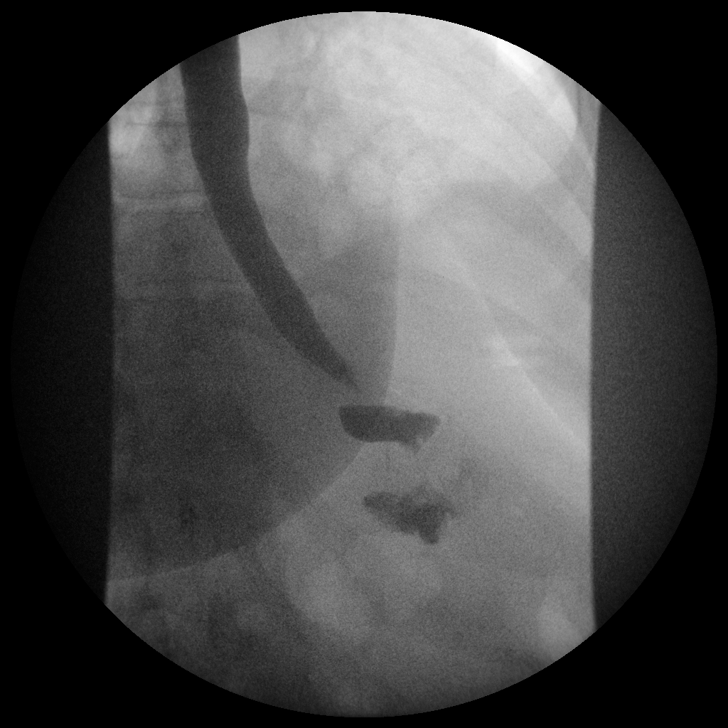

[Series 3: run · 1 of 1 slices shown (3 of 9)]
[im 1/1]
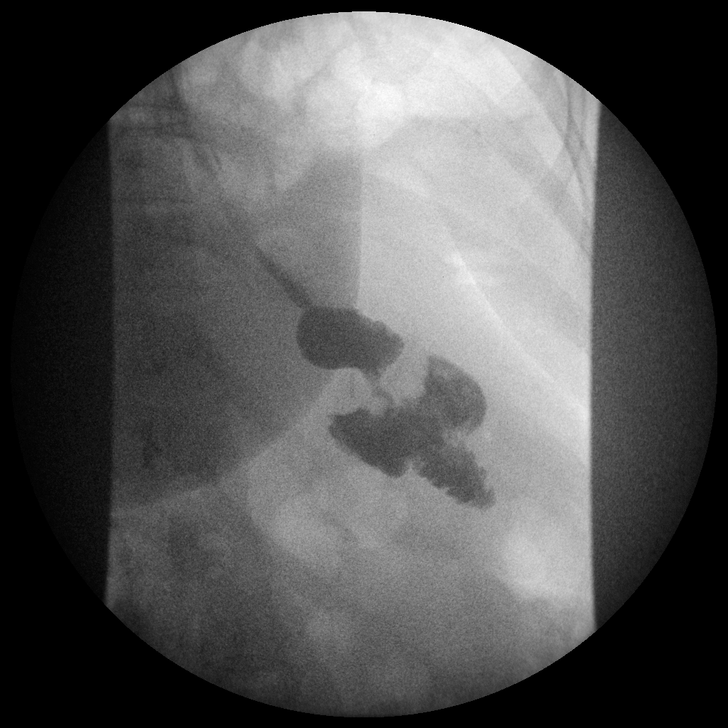

[Series 4: run · 1 of 1 slices shown (4 of 9)]
[im 1/1]
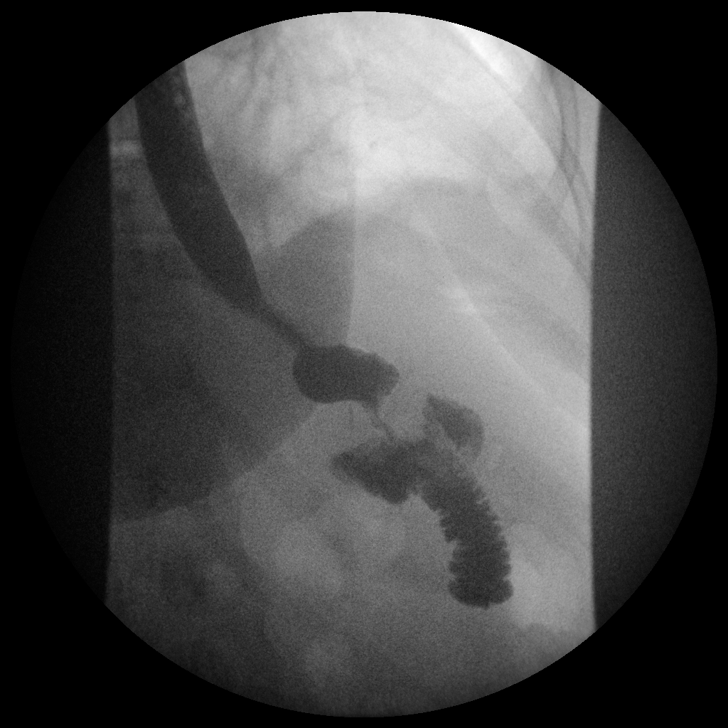

[Series 5: run · 1 of 1 slices shown (5 of 9)]
[im 1/1]
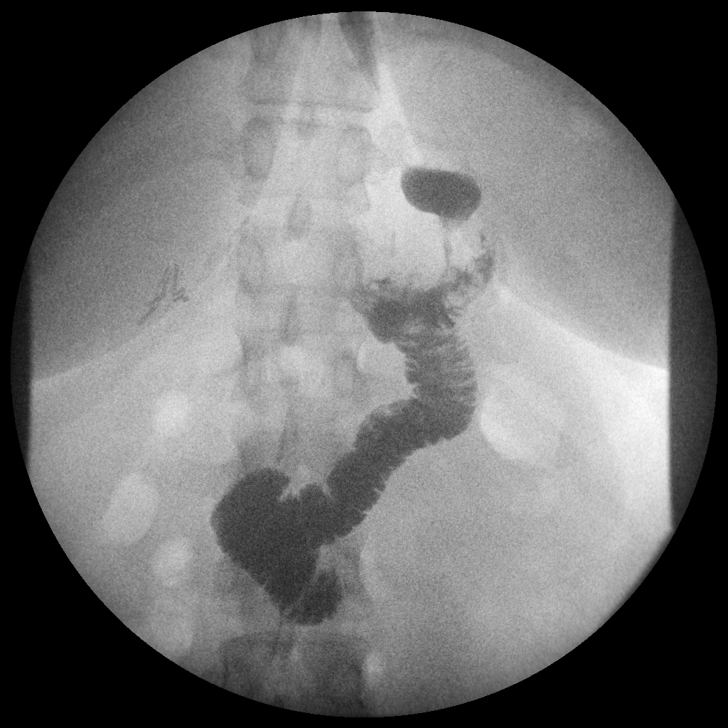

[Series 6: run · 1 of 1 slices shown (6 of 9)]
[im 1/1]
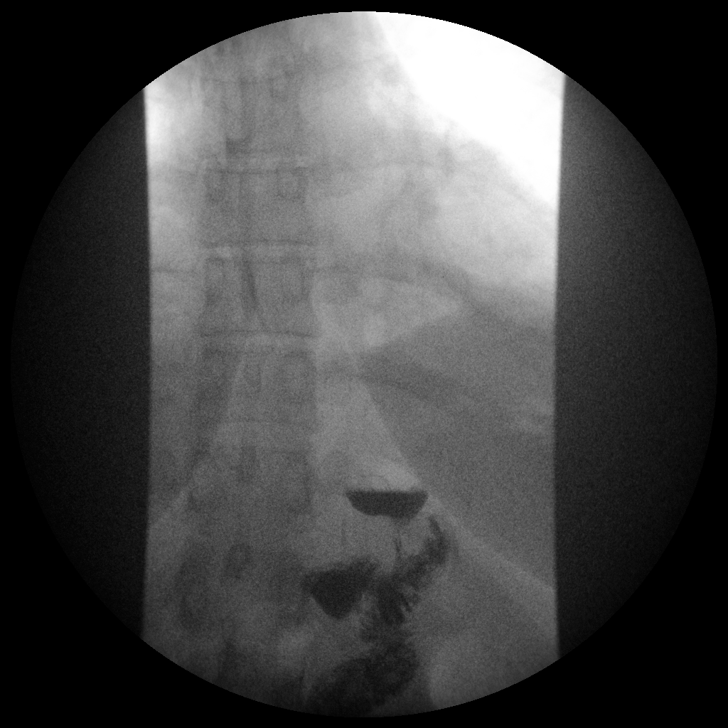

[Series 7: run · 1 of 1 slices shown (7 of 9)]
[im 1/1]
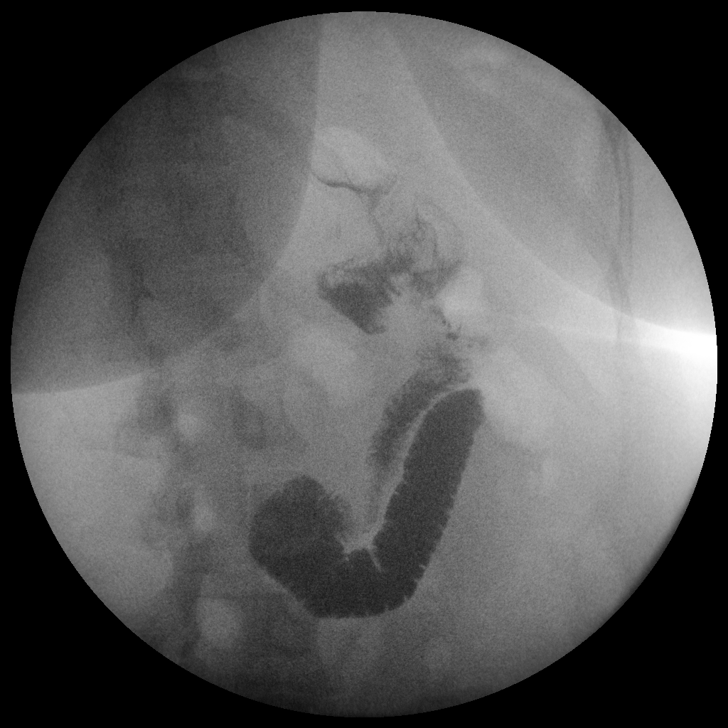

[Series 8: run · 1 of 1 slices shown (8 of 9)]
[im 1/1]
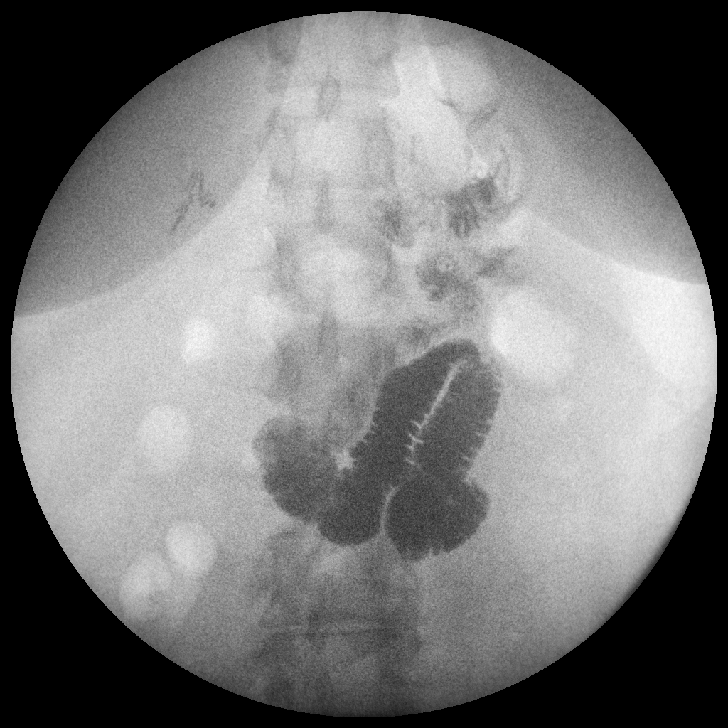

[Series 9: run · 1 of 1 slices shown (9 of 9)]
[im 1/1]
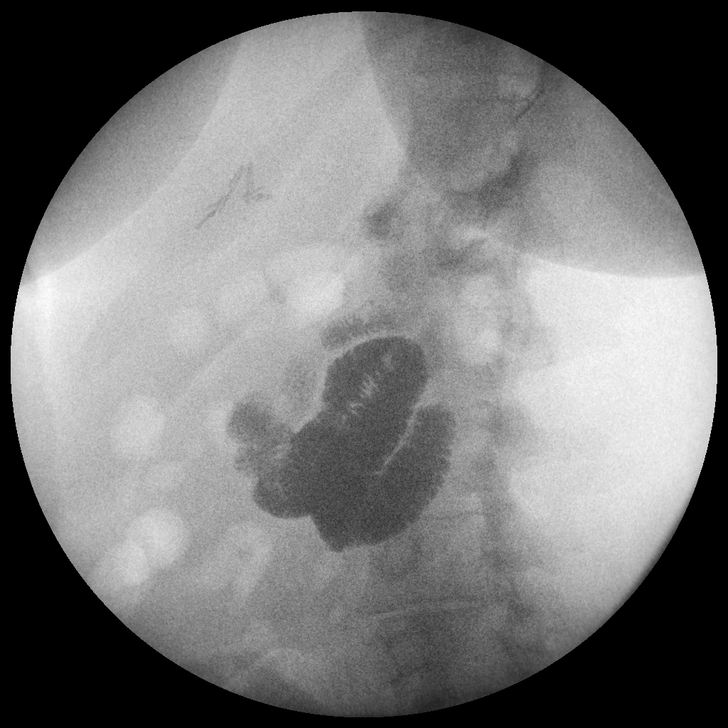

[9 of 9 positions shown; findings below may reference images not displayed]

FLUOROSCOPY TIME:  Fluoroscopy Time (in minutes and seconds): 1
minutes 13 seconds

Number of Acquired Images:  9
FINDINGS: Scout radiograph: Unremarkable bowel gas pattern. Surgical clips
from prior cholecystectomy.

Esophagus:  No evidence of esophageal mass or stricture.

Stomach: Expected postoperative appearance of gastric pouch is seen.
Prompt contrast emptying from the gastric pouch is demonstrated.
There is no evidence of contrast leak or extravasation.

Small Bowel: Efferent small bowel shows no evidence of dilatation or
obstruction. Jejunal fold pattern is within normal limits. There is
no evidence of contrast leak or extravasation.

Other:  None.
IMPRESSION: Expected postop appearance status post gastric bypass surgery. No
evidence of postop leak, obstruction, or other complication.

## 2015-12-14 ENCOUNTER — Ambulatory Visit (INDEPENDENT_AMBULATORY_CARE_PROVIDER_SITE_OTHER): Payer: BC Managed Care – PPO | Admitting: Family Medicine

## 2015-12-14 VITALS — BP 122/90 | HR 127 | Temp 98.9°F | Ht 64.0 in | Wt 152.0 lb

## 2015-12-14 DIAGNOSIS — Z0000 Encounter for general adult medical examination without abnormal findings: Secondary | ICD-10-CM | POA: Diagnosis not present

## 2015-12-14 NOTE — Progress Notes (Signed)
Subjective:    Patient ID: Catherine Washington, female    DOB: 30-Jun-1976, 40 y.o.   MRN: [id number]  HPI   Patient here for physical exam. She had gastric bypass surgery last summer and has lost around 238 pounds to current weight of 152 pounds. Her ideal goal weight is around 135 pounds. She's had no complications. She had follow-up with surgery recently and had follow-up labs which were unremarkable.   She takes Wellbutrin 100 mg 3 times a day and feels her depression is stable on that dosage. She takes B12 supplementation. Taking multivitamin twice daily  overall feels well. No history of smoking.  Past Medical History  Diagnosis Date  . Chicken pox   . Depression   . Allergy   . Hyperlipidemia   . UTI (urinary tract infection)   . Family history of adverse reaction to anesthesia     mother gets very nausaus  . Headache     occasionally   Past Surgical History  Procedure Laterality Date  . Childbirth  1995, 2003  . Cholecystectomy    . Abdominal hysterectomy  2006    dysfunctional bleeding  . Breath tek h pylori N/A 12/06/2014    Procedure: BREATH TEK H PYLORI;  Surgeon: Greer Pickerel, MD;  Location: [facility];  Service: General;  Laterality: N/A;  . 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 She has never used smokeless tobacco. She reports that she does not drink alcohol or use illicit drugs. family history includes Cancer in her maternal aunt and paternal grandmother; Diabetes in her father, maternal grandmother, mother, and paternal grandfather; Heart disease (age of onset: 35) in her father; Hyperlipidemia in her father and mother; Hypertension in her father; Mental illness in her mother; Stroke in her paternal grandmother. Allergies  Allergen Reactions  . Adhesive [Tape] Other (See  Comments)    Blisters  . Nitrofurantoin Monohyd Macro     GI upset      Review of Systems  Constitutional: Negative for fever, activity change, appetite change, fatigue and unexpected weight change.  HENT: Negative for ear pain, hearing loss, sore throat and trouble swallowing.   Eyes: Negative for visual disturbance.  Respiratory: Negative for cough and shortness of breath.   Cardiovascular: Negative for chest pain and palpitations.  Gastrointestinal: Negative for abdominal pain, diarrhea, constipation and blood in stool.  Endocrine: Negative for polydipsia and polyuria.  Genitourinary: Negative for dysuria and hematuria.  Musculoskeletal: Negative for myalgias, back pain and arthralgias.  Skin: Negative for rash.  Neurological: Negative for dizziness, syncope and headaches.  Hematological: Negative for adenopathy.  Psychiatric/Behavioral: Negative for confusion and dysphoric mood.       Objective:   Physical Exam  Constitutional: She is oriented to person, place, and time. She appears well-developed and well-nourished.  HENT:  Head: Normocephalic and atraumatic.  Eyes: EOM are normal. Pupils are equal, round, and reactive to light.  Neck: Normal range of motion. Neck supple. No thyromegaly present.  Cardiovascular: Normal rate, regular rhythm and normal heart sounds.   No murmur heard. Pulmonary/Chest: Breath sounds normal. No respiratory distress. She has no wheezes. She has no rales.  Abdominal: Soft. Bowel sounds are normal. She exhibits no distension and no mass. There is no tenderness. There is no rebound and no guarding.  Genitourinary:  Per GYN  Musculoskeletal: Normal range of motion. She exhibits no edema.  Lymphadenopathy:    She has no cervical adenopathy.  Neurological: She is alert and oriented to person, place, and time. She displays normal reflexes. No cranial nerve deficit.  Skin: No rash noted.  Psychiatric: She has a normal mood and affect. Her behavior  is normal. Judgment and thought content normal.          Assessment & Plan:   Physical exam. Patient has done tremendous job with weight loss over the past year following gastric bypass surgery. Recent labs unremarkable. Immunizations up-to-date. She is seeing gynecologist for Pap smears. We discussed importance of establishing consistent exercise try to maintain her weight loss.

## 2015-12-14 NOTE — Progress Notes (Signed)
Pre visit review using our clinic review tool, if applicable. No additional management support is needed unless otherwise documented below in the visit note. 

## 2016-01-06 ENCOUNTER — Telehealth: Payer: BC Managed Care – PPO | Admitting: Family

## 2016-01-06 DIAGNOSIS — B9689 Other specified bacterial agents as the cause of diseases classified elsewhere: Secondary | ICD-10-CM

## 2016-01-06 DIAGNOSIS — J329 Chronic sinusitis, unspecified: Secondary | ICD-10-CM

## 2016-01-06 MED ORDER — AMOXICILLIN-POT CLAVULANATE 875-125 MG PO TABS: 1.0000 | ORAL_TABLET | Freq: Two times a day (BID) | ORAL | Status: AC

## 2016-01-06 NOTE — Progress Notes (Signed)

## 2016-01-13 ENCOUNTER — Telehealth: Payer: BC Managed Care – PPO | Admitting: Nurse Practitioner

## 2016-01-13 DIAGNOSIS — J0101 Acute recurrent maxillary sinusitis: Secondary | ICD-10-CM

## 2016-01-13 MED ORDER — AZITHROMYCIN 250 MG PO TABS: ORAL_TABLET | ORAL | Status: DC

## 2016-01-13 NOTE — Progress Notes (Signed)

## 2016-01-23 ENCOUNTER — Other Ambulatory Visit: Payer: Self-pay | Admitting: Obstetrics and Gynecology

## 2016-01-24 LAB — CYTOLOGY - PAP

## 2016-03-05 ENCOUNTER — Encounter: Payer: Self-pay | Admitting: Family Medicine

## 2016-03-05 NOTE — Telephone Encounter (Signed)
Is this okay?

## 2016-03-06 ENCOUNTER — Other Ambulatory Visit: Payer: Self-pay | Admitting: Family Medicine

## 2016-03-06 MED ORDER — BUPROPION HCL 100 MG PO TABS: 100.0000 mg | ORAL_TABLET | Freq: Three times a day (TID) | ORAL | Status: DC

## 2016-05-31 ENCOUNTER — Telehealth: Payer: BC Managed Care – PPO | Admitting: Physician Assistant

## 2016-05-31 DIAGNOSIS — B9689 Other specified bacterial agents as the cause of diseases classified elsewhere: Secondary | ICD-10-CM

## 2016-05-31 DIAGNOSIS — J019 Acute sinusitis, unspecified: Secondary | ICD-10-CM

## 2016-05-31 MED ORDER — AMOXICILLIN-POT CLAVULANATE 875-125 MG PO TABS: 1.0000 | ORAL_TABLET | Freq: Two times a day (BID) | ORAL | 0 refills | Status: DC

## 2016-05-31 NOTE — Progress Notes (Signed)

## 2016-10-16 ENCOUNTER — Other Ambulatory Visit: Payer: Self-pay | Admitting: Family Medicine

## 2017-01-01 ENCOUNTER — Telehealth: Payer: BC Managed Care – PPO | Admitting: Nurse Practitioner

## 2017-01-01 DIAGNOSIS — J0100 Acute maxillary sinusitis, unspecified: Secondary | ICD-10-CM

## 2017-01-01 DIAGNOSIS — J019 Acute sinusitis, unspecified: Secondary | ICD-10-CM

## 2017-01-01 DIAGNOSIS — B9689 Other specified bacterial agents as the cause of diseases classified elsewhere: Secondary | ICD-10-CM

## 2017-01-01 MED ORDER — AMOXICILLIN-POT CLAVULANATE 875-125 MG PO TABS: 1.0000 | ORAL_TABLET | Freq: Two times a day (BID) | ORAL | 0 refills | Status: DC

## 2017-01-01 NOTE — Progress Notes (Signed)

## 2017-01-08 ENCOUNTER — Telehealth: Payer: BC Managed Care – PPO | Admitting: Family

## 2017-01-08 DIAGNOSIS — B9689 Other specified bacterial agents as the cause of diseases classified elsewhere: Secondary | ICD-10-CM

## 2017-01-08 DIAGNOSIS — J028 Acute pharyngitis due to other specified organisms: Secondary | ICD-10-CM

## 2017-01-08 MED ORDER — BENZONATATE 100 MG PO CAPS: 100.0000 mg | ORAL_CAPSULE | Freq: Three times a day (TID) | ORAL | 0 refills | Status: DC | PRN

## 2017-01-08 MED ORDER — PREDNISONE 5 MG PO TABS: 5.0000 mg | ORAL_TABLET | ORAL | 0 refills | Status: DC

## 2017-01-08 NOTE — Progress Notes (Signed)
We are sorry that you are not feeling well.  Here is how we plan to help!  Based on what you have shared with me it looks like you have upper respiratory tract inflammation that has resulted in a significant cough.  Inflammation and infection in the upper respiratory tract is commonly called bronchitis and has four common causes:  Allergies, Viral Infections, Acid Reflux and Bacterial Infections.  Allergies, viruses and acid reflux are treated by controlling symptoms or eliminating the cause. An example might be a cough caused by taking certain blood pressure medications. You stop the cough by changing the medication. Another example might be a cough caused by acid reflux. Controlling the reflux helps control the cough.  Based on your presentation I believe you most likely have A cough due to bacteria.  When patients have a fever and a productive cough with a change in color or increased sputum production, we are concerned about bacterial bronchitis.  If left untreated it can progress to pneumonia.  If your symptoms do not improve with your treatment plan it is important that you contact your provider.    *It is too early to start more antibiotics as the current ones are still working in your body at this time for another 24-48 hours. The irritation in your airway can sometimes persist for a few days and even a week or 2 in some cases.  The solution here is to focus on the treatments below to address the cough and inflammation.   In addition you may use A non-prescription cough medication called Mucinex DM: take 2 tablets every 12 hours. and A prescription cough medication called Tessalon Perles 100mg . You may take 1-2 capsules every 8 hours as needed for your cough.  Sterapred 5 mg dosepak  USE OF BRONCHODILATOR ("RESCUE") INHALERS: There is a risk from using your bronchodilator too frequently.  The risk is that over-reliance on a medication which only relaxes the muscles surrounding the breathing  tubes can reduce the effectiveness of medications prescribed to reduce swelling and congestion of the tubes themselves.  Although you feel brief relief from the bronchodilator inhaler, your asthma may actually be worsening with the tubes becoming more swollen and filled with mucus.  This can delay other crucial treatments, such as oral steroid medications. If you need to use a bronchodilator inhaler daily, several times per day, you should discuss this with your provider.  There are probably better treatments that could be used to keep your asthma under control.     HOME CARE . Only take medications as instructed by your medical team. . Complete the entire course of an antibiotic. . Drink plenty of fluids and get plenty of rest. . Avoid close contacts especially the very young and the elderly . Cover your mouth if you cough or cough into your sleeve. . Always remember to wash your hands . A steam or ultrasonic humidifier can help congestion.   GET HELP RIGHT AWAY IF: . You develop worsening fever. . You become short of breath . You cough up blood. . Your symptoms persist after you have completed your treatment plan MAKE SURE YOU   Understand these instructions.  Will watch your condition.  Will get help right away if you are not doing well or get worse.  Your e-visit answers were reviewed by a board certified advanced clinical practitioner to complete your personal care plan.  Depending on the condition, your plan could have included both over the counter or prescription medications.  If there is a problem please reply  once you have received a response from your provider. Your safety is important to [place].  If you have drug allergies check your prescription carefully.    You can use MyChart to ask questions about today's visit, request a non-urgent call back, or ask for a work or school excuse for 24 hours related to this e-Visit. If it has been greater than 24 hours you will need to follow  up with your provider, or enter a new e-Visit to address those concerns. You will get an e-mail in the next two days asking about your experience.  I hope that your e-visit has been valuable and will speed your recovery. Thank you for using e-visits.

## 2017-01-10 ENCOUNTER — Other Ambulatory Visit: Payer: Self-pay | Admitting: Family Medicine

## 2017-01-10 NOTE — Telephone Encounter (Signed)
Denied.  Filled on 10/16/16 for 9 months.  Message sent to the pharmacy to check file.

## 2017-01-23 ENCOUNTER — Other Ambulatory Visit: Payer: Self-pay | Admitting: Obstetrics and Gynecology

## 2017-01-24 LAB — CYTOLOGY - PAP

## 2017-02-07 ENCOUNTER — Other Ambulatory Visit: Payer: Self-pay | Admitting: Family Medicine

## 2017-03-06 ENCOUNTER — Other Ambulatory Visit: Payer: Self-pay | Admitting: Family Medicine

## 2017-04-01 ENCOUNTER — Other Ambulatory Visit: Payer: Self-pay | Admitting: Family Medicine

## 2017-04-17 ENCOUNTER — Encounter: Payer: Self-pay | Admitting: Family Medicine

## 2017-04-17 ENCOUNTER — Ambulatory Visit (INDEPENDENT_AMBULATORY_CARE_PROVIDER_SITE_OTHER): Payer: BC Managed Care – PPO | Admitting: Family Medicine

## 2017-04-17 VITALS — BP 120/78 | HR 71 | Temp 98.9°F | Wt 145.8 lb

## 2017-04-17 DIAGNOSIS — F3340 Major depressive disorder, recurrent, in remission, unspecified: Secondary | ICD-10-CM | POA: Diagnosis not present

## 2017-04-17 MED ORDER — BUPROPION HCL 100 MG PO TABS: 100.0000 mg | ORAL_TABLET | Freq: Three times a day (TID) | ORAL | 11 refills | Status: DC

## 2017-04-17 NOTE — Progress Notes (Signed)
Subjective:     Patient ID: Catherine Washington, female   DOB: 1975/11/04, 41 y.o.   MRN: [id number]  HPI   Patient seen for follow-up regarding recurrent depression. She's had multiple episodes of recurrent depression over several years. She feels her current symptoms are stable. She is currently on Wellbutrin 100 mg 3 times a day. She had history of gastric bypass couple years ago and has done excellent job with weight loss. She cannot take extended release Wellbutrin because of that. She is happy with current medication. She has some chronic insomnia which is unchanged. We discussed the fact that Wellbutrin could contribute (to insomnia). She is reluctant to make changes.  Depression screen [facility] 2/9 04/17/2017 05/03/2015 03/22/2015  Decreased Interest 0 0 0  Down, Depressed, Hopeless 0 0 0  PHQ - 2 Score 0 0 0  Altered sleeping 3 - -  Tired, decreased energy 1 - -  Change in appetite 0 - -  Feeling bad or failure about yourself  0 - -  Trouble concentrating 0 - -  Moving slowly or fidgety/restless 0 - -  Suicidal thoughts 0 - -  PHQ-9 Score 4 - -     Past Medical History:  Diagnosis Date  . Allergy   . Chicken pox   . Depression   . Family history of adverse reaction to anesthesia    mother gets very nausaus  . Headache    occasionally  . Hyperlipidemia   . UTI (urinary tract infection)    Past Surgical History:  Procedure Laterality Date  . ABDOMINAL HYSTERECTOMY  2006   dysfunctional bleeding  . BREATH TEK H PYLORI N/A 12/06/2014   Procedure: BREATH TEK H PYLORI;  Surgeon: Greer Pickerel, MD;  Location: [facility];  Service: General;  Laterality: N/A;  . childbirth  1995, 2003  . CHOLECYSTECTOMY    . 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 She has never used smokeless tobacco. She reports that she  does not drink alcohol or use drugs. family history includes Cancer in her maternal aunt and paternal grandmother; Diabetes in her father, maternal grandmother, mother, and paternal grandfather; Heart disease (age of onset: 44) in her father; Hyperlipidemia in her father and mother; Hypertension in her father; Mental illness in her mother; Stroke in her paternal grandmother. Allergies  Allergen Reactions  . Adhesive [Tape] Other (See Comments)    Blisters  . Nitrofurantoin Monohyd Macro     GI upset     Review of Systems  Constitutional: Negative for appetite change, fatigue and unexpected weight change.  Psychiatric/Behavioral: Negative for dysphoric mood and suicidal ideas.       Objective:   Physical Exam  Constitutional: She appears well-developed and well-nourished.  Cardiovascular: Normal rate and regular rhythm.   Pulmonary/Chest: Effort normal and breath sounds normal. No respiratory distress. She has no wheezes. She has no rales.  Musculoskeletal: She exhibits no edema.  Psychiatric: She has a normal mood and affect. Her behavior is normal. Judgment and thought content normal.       Assessment:     History recurrent depression currently stable. PHQ-9 score today of 4    Plan:     -Refill Wellbutrin for one year -Follow-up on a yearly basis and sooner as needed for any recurrent depression symptoms or other concerns -  She is getting regular yearly follow-up with dermatology and also through the gastric bypass follow-up clinic  Eulas Post MD [facility]

## 2017-06-02 ENCOUNTER — Telehealth: Payer: BC Managed Care – PPO | Admitting: Nurse Practitioner

## 2017-06-02 DIAGNOSIS — J0100 Acute maxillary sinusitis, unspecified: Secondary | ICD-10-CM

## 2017-06-02 MED ORDER — AMOXICILLIN-POT CLAVULANATE 875-125 MG PO TABS: 1.0000 | ORAL_TABLET | Freq: Two times a day (BID) | ORAL | 0 refills | Status: DC

## 2017-06-02 NOTE — Addendum Note (Signed)
Addended by: Chevis Pretty on: 06/02/2017 01:38 PM   Modules accepted: Orders

## 2017-06-02 NOTE — Progress Notes (Signed)

## 2017-06-06 ENCOUNTER — Encounter: Payer: Self-pay | Admitting: Family Medicine

## 2018-01-10 ENCOUNTER — Ambulatory Visit: Payer: BC Managed Care – PPO | Admitting: Family Medicine

## 2018-01-10 ENCOUNTER — Encounter: Payer: Self-pay | Admitting: Family Medicine

## 2018-01-10 VITALS — BP 130/82 | HR 97 | Temp 98.5°F | Resp 12 | Ht 64.0 in | Wt 144.2 lb

## 2018-01-10 DIAGNOSIS — G4700 Insomnia, unspecified: Secondary | ICD-10-CM

## 2018-01-10 DIAGNOSIS — F339 Major depressive disorder, recurrent, unspecified: Secondary | ICD-10-CM

## 2018-01-10 MED ORDER — VENLAFAXINE HCL ER 37.5 MG PO CP24: 37.5000 mg | ORAL_CAPSULE | Freq: Every day | ORAL | 0 refills | Status: DC

## 2018-01-10 MED ORDER — LORAZEPAM 0.5 MG PO TABS: 0.5000 mg | ORAL_TABLET | Freq: Every evening | ORAL | 0 refills | Status: DC | PRN
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
## 2018-01-10 NOTE — Patient Instructions (Signed)
  Ms.Catherine Washington I have seen you today for an acute visit.  A few things to remember from today's visit:   Episode of recurrent major depressive disorder, unspecified depression episode severity ([place]) - Plan: venlafaxine XR (EFFEXOR XR) 37.5 MG 24 hr capsule, LORazepam (ATIVAN) 0.5 MG tablet  Insomnia, unspecified type   Medications prescribed today are intended for short period of time and will not be refill upon request, a follow up appointment might be necessary to discuss continuation of of treatment if appropriate.   Today Ativan added at night,this med is supposed to be for short period of time. Effexor 37.5 mg added. Keep appointment with therapist.    In general please monitor for signs of worsening symptoms and seek immediate medical attention if any concerning.

## 2018-02-03 ENCOUNTER — Encounter: Payer: Self-pay | Admitting: Family Medicine

## 2018-02-03 ENCOUNTER — Ambulatory Visit: Payer: BC Managed Care – PPO | Admitting: Family Medicine

## 2018-02-03 DIAGNOSIS — F339 Major depressive disorder, recurrent, unspecified: Secondary | ICD-10-CM

## 2018-02-03 MED ORDER — VENLAFAXINE HCL ER 37.5 MG PO CP24: 37.5000 mg | ORAL_CAPSULE | Freq: Every day | ORAL | 11 refills | Status: DC

## 2018-02-03 MED ORDER — BUPROPION HCL 100 MG PO TABS: 100.0000 mg | ORAL_TABLET | Freq: Three times a day (TID) | ORAL | 11 refills | Status: DC

## 2018-02-03 NOTE — Progress Notes (Signed)
  Subjective:     Patient ID: Catherine Washington, female   DOB: 15-Sep-1976, 42 y.o.   MRN: [id number]  HPI Patient is here for follow-up regarding depression. She's had at least 4 separate bouts of major depressive disorder. She is currently on regimen of Wellbutrin 100 mg 3 times a day and Effexor 37.5 mg. She takes immediate release Wellbutrin because of her prior history of gastric bypass. She is on extended release Effexor but very low dosage and apparently has gotten the approval from her surgeon to do that. She feels her depression symptoms are very stable.  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 No regular alcohol use.  Past Medical History:  Diagnosis Date  . Allergy   . Chicken pox   . Depression   . Family history of adverse reaction to anesthesia    mother gets very nausaus  . Headache    occasionally  . Hyperlipidemia   . UTI (urinary tract infection)    Past Surgical History:  Procedure Laterality Date  . ABDOMINAL HYSTERECTOMY  2006   dysfunctional bleeding  . BREATH TEK H PYLORI N/A 12/06/2014   Procedure: BREATH TEK H PYLORI;  Surgeon: Greer Pickerel, MD;  Location: [facility];  Service: General;  Laterality: N/A;  . childbirth  1995, 2003  . CHOLECYSTECTOMY    . 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 She has never used smokeless tobacco. She reports that she does not drink alcohol or use drugs. family history includes Cancer in her maternal aunt and paternal grandmother; Diabetes in her father, maternal grandmother, mother, and paternal grandfather; Heart disease (age of onset: 62) in her father; Hyperlipidemia in her father and mother; Hypertension in her father; Mental illness in her mother; Stroke in her paternal  grandmother. Allergies  Allergen Reactions  . Adhesive [Tape] Other (See Comments)    Blisters  . Nitrofurantoin Monohyd Macro     GI upset     Review of Systems  Constitutional: Negative for appetite change and unexpected weight change.  Psychiatric/Behavioral: Negative for dysphoric mood and sleep disturbance.       Objective:   Physical Exam  Constitutional: She appears well-developed and well-nourished.  Cardiovascular: Normal rate and regular rhythm.  Pulmonary/Chest: Effort normal and breath sounds normal. No respiratory distress. She has no wheezes. She has no rales.  Psychiatric:  PHQ-9=4       Assessment:     History of recurrent depression currently stable with PHQ-9 score of 4.    Plan:     -Refill Effexor and Wellbutrin for one year -She is getting regular lab work from her gastric bypass surgeon so labs were deferred at this time -Routine follow-up in one year and sooner as needed  Eulas Post MD [facility] at [facility]

## 2018-02-26 ENCOUNTER — Other Ambulatory Visit: Payer: Self-pay | Admitting: Obstetrics and Gynecology

## 2018-02-26 DIAGNOSIS — R928 Other abnormal and inconclusive findings on diagnostic imaging of breast: Secondary | ICD-10-CM

## 2018-02-28 ENCOUNTER — Ambulatory Visit
Admission: RE | Admit: 2018-02-28 | Discharge: 2018-02-28 | Disposition: A | Discharge status: Home / Self Care | Payer: BC Managed Care – PPO | Source: Ambulatory Visit | Attending: Obstetrics and Gynecology | Admitting: Obstetrics and Gynecology

## 2018-02-28 DIAGNOSIS — R928 Other abnormal and inconclusive findings on diagnostic imaging of breast: Secondary | ICD-10-CM

## 2018-09-05 ENCOUNTER — Telehealth: Payer: BC Managed Care – PPO | Admitting: Nurse Practitioner

## 2018-09-05 DIAGNOSIS — J069 Acute upper respiratory infection, unspecified: Secondary | ICD-10-CM

## 2018-09-05 DIAGNOSIS — J029 Acute pharyngitis, unspecified: Secondary | ICD-10-CM

## 2018-09-05 NOTE — Progress Notes (Signed)
We are sorry you are not feeling well.  Here is how we plan to help!  Based on what you have shared with me, it looks like you may have a viral upper respiratory infection or a "common cold".  Colds are caused by a large number of viruses; however, rhinovirus is the most common cause.   Symptoms of the common cold vary from person to person, with common symptoms including sore throat, cough, and malaise.  A low-grade fever of 100.4 may present, but is often uncommon.  Symptoms vary however, and are closely related to a person's age or underlying illnesses.  The most common symptoms associated with the common cold are nasal discharge or congestion, cough, sneezing, headache and pressure in the ears and face.  Cold symptoms usually persist for about 3 to 10 days, but can last up to 2 weeks.  It is important to know that colds do not cause serious illness or complications in most cases.    The common cold is transmitted from person to person, with the most common method of transmission being a person's hands.  The virus is able to live on the skin and can infect other persons for up to 2 hours after direct contact.  Also, colds are transmitted when someone coughs or sneezes; thus, it is important to cover the mouth to reduce this risk.  To keep the spread of the common cold at [place], good hand hygiene is very important.  This is an infection that is most likely caused by a virus. There are no specific treatments for the common cold other than to help you with the symptoms until the infection runs its course.  Marland KitchenProviders prescribe antibiotics to treat infections caused by bacteria. Antibiotics are very powerful in treating bacterial infections when they are used properly. To maintain their effectiveness, they should be used only when necessary. Overuse of antibiotics has resulted in the development of superbugs that are resistant to treatment!    After careful review of your answers, I would not recommend an  antibiotic for your condition.  Antibiotics are not effective against viruses and therefore should not be used to treat them. Common examples of infections caused by viruses include colds and flu    For nasal congestion, you may use an oral decongestants such as Mucinex D or if you have glaucoma or high blood pressure use plain Mucinex.  Saline nasal spray or nasal drops can help and can safely be used as often as needed for congestion.    If you do not have a history of heart disease, hypertension, diabetes or thyroid disease, prostate/bladder issues or glaucoma, you may also use Sudafed to treat nasal congestion.  It is highly recommended that you consult with a pharmacist or your primary care physician to ensure this medication is safe for you to take.     If you have a cough, you may use cough suppressants such as Delsym and Robitussin.  If you have glaucoma or high blood pressure, you can also use Coricidin HBP.     If you have a sore or scratchy throat, use a saltwater gargle-  to  teaspoon of salt dissolved in a 4-ounce to 8-ounce glass of warm water.  Gargle the solution for approximately 15-30 seconds and then spit.  It is important not to swallow the solution.  You can also use throat lozenges/cough drops and Chloraseptic spray to help with throat pain or discomfort.  Warm or cold liquids can also be helpful  in relieving throat pain.  For headache, pain or general discomfort, you can use Ibuprofen or Tylenol as directed.   Some authorities believe that zinc sprays or the use of Echinacea may shorten the course of your symptoms.   HOME CARE . Only take medications as instructed by your medical team. . Be sure to drink plenty of fluids. Water is fine as well as fruit juices, sodas and electrolyte beverages. You may want to stay away from caffeine or alcohol. If you are nauseated, try taking small sips of liquids. How do you know if you are getting enough fluid? Your urine should be a  pale yellow or almost colorless. . Get rest. . Taking a steamy shower or using a humidifier may help nasal congestion and ease sore throat pain. You can place a towel over your head and breathe in the steam from hot water coming from a faucet. . Using a saline nasal spray works much the same way. . Cough drops, hard candies and sore throat lozenges may ease your cough. . Avoid close contacts especially the very young and the elderly . Cover your mouth if you cough or sneeze . Always remember to wash your hands.   GET HELP RIGHT AWAY IF: . You develop worsening fever. . If your symptoms do not improve within 10 days . You develop yellow or green discharge from your nose over 3 days. . You have coughing fits . You develop a severe head ache or visual changes. . You develop shortness of breath, difficulty breathing or start having chest pain . Your symptoms persist after you have completed your treatment plan  MAKE SURE YOU   Understand these instructions.  Will watch your condition.  Will get help right away if you are not doing well or get worse.  Your e-visit answers were reviewed by a board certified advanced clinical practitioner to complete your personal care plan. Depending upon the condition, your plan could have included both over the counter or prescription medications. Please review your pharmacy choice. If there is a problem, you may call our nursing hot line at and have the prescription routed to another pharmacy. Your safety is important to [place]. If you have drug allergies check your prescription carefully.   You can use MyChart to ask questions about today's visit, request a non-urgent call back, or ask for a work or school excuse for 24 hours related to this e-Visit. If it has been greater than 24 hours you will need to follow up with your provider, or enter a new e-Visit to address those concerns. You will get an e-mail in the next two days asking about your experience.  I  hope that your e-visit has been valuable and will speed your recovery. Thank you for using e-visits.

## 2018-11-27 ENCOUNTER — Telehealth: Payer: BC Managed Care – PPO | Admitting: Physician Assistant

## 2018-11-27 DIAGNOSIS — B9789 Other viral agents as the cause of diseases classified elsewhere: Secondary | ICD-10-CM

## 2018-11-27 DIAGNOSIS — J019 Acute sinusitis, unspecified: Secondary | ICD-10-CM

## 2018-11-27 MED ORDER — IPRATROPIUM BROMIDE 0.03 % NA SOLN: 2.0000 | Freq: Two times a day (BID) | NASAL | 0 refills | Status: DC

## 2018-11-27 NOTE — Progress Notes (Signed)
I have spent 5 minutes in review of e-visit questionnaire, review and updating patient chart, medical decision making and response to patient.   Feliz Lincoln Cody Quintan Saldivar, PA-C    

## 2018-11-27 NOTE — Progress Notes (Signed)

## 2018-12-09 ENCOUNTER — Encounter: Payer: Self-pay | Admitting: Physician Assistant

## 2018-12-09 ENCOUNTER — Telehealth: Payer: BC Managed Care – PPO | Admitting: Physician Assistant

## 2018-12-09 DIAGNOSIS — J208 Acute bronchitis due to other specified organisms: Secondary | ICD-10-CM

## 2018-12-09 DIAGNOSIS — B9689 Other specified bacterial agents as the cause of diseases classified elsewhere: Secondary | ICD-10-CM

## 2018-12-09 MED ORDER — AZITHROMYCIN 250 MG PO TABS: ORAL_TABLET | ORAL | 0 refills | Status: DC

## 2018-12-09 MED ORDER — BENZONATATE 100 MG PO CAPS: 100.0000 mg | ORAL_CAPSULE | Freq: Two times a day (BID) | ORAL | 0 refills | Status: DC | PRN

## 2018-12-09 NOTE — Progress Notes (Signed)
We are sorry that you are not feeling well.  Here is how we plan to help!  Based on your presentation I believe you most likely have A cough due to bacteria.  When patients have a fever and a productive cough with a change in color or increased sputum production, we are concerned about bacterial bronchitis.  If left untreated it can progress to pneumonia.  If your symptoms do not improve with your treatment plan it is important that you contact your provider.   I have prescribed Azithromyin 250 mg: two tablets now and then one tablet daily for 4 additonal days    In addition you may use A prescription cough medication called Tessalon Perles 100mg . You may take 1-2 capsules every 8 hours as needed for your cough.   Antibiotic use supported by - duration of illness, mucus production.  From your responses in the eVisit questionnaire you describe inflammation in the upper respiratory tract which is causing a significant cough.  This is commonly called Bronchitis and has four common causes:    Allergies  Viral Infections  Acid Reflux  Bacterial Infection Allergies, viruses and acid reflux are treated by controlling symptoms or eliminating the cause. An example might be a cough caused by taking certain blood pressure medications. You stop the cough by changing the medication. Another example might be a cough caused by acid reflux. Controlling the reflux helps control the cough.  USE OF BRONCHODILATOR ("RESCUE") INHALERS: There is a risk from using your bronchodilator too frequently.  The risk is that over-reliance on a medication which only relaxes the muscles surrounding the breathing tubes can reduce the effectiveness of medications prescribed to reduce swelling and congestion of the tubes themselves.  Although you feel brief relief from the bronchodilator inhaler, your asthma may actually be worsening with the tubes becoming more swollen and filled with mucus.  This can delay other crucial  treatments, such as oral steroid medications. If you need to use a bronchodilator inhaler daily, several times per day, you should discuss this with your provider.  There are probably better treatments that could be used to keep your asthma under control.     HOME CARE . Only take medications as instructed by your medical team. . Complete the entire course of an antibiotic. . Drink plenty of fluids and get plenty of rest. . Avoid close contacts especially the very young and the elderly . Cover your mouth if you cough or cough into your sleeve. . Always remember to wash your hands . A steam or ultrasonic humidifier can help congestion.   GET HELP RIGHT AWAY IF: . You develop worsening fever. . You become short of breath . You cough up blood. . Your symptoms persist after you have completed your treatment plan MAKE SURE YOU   Understand these instructions.  Will watch your condition.  Will get help right away if you are not doing well or get worse.  Your e-visit answers were reviewed by a board certified advanced clinical practitioner to complete your personal care plan.  Depending on the condition, your plan could have included both over the counter or prescription medications. If there is a problem please reply  once you have received a response from your provider. Your safety is important to [place].  If you have drug allergies check your prescription carefully.    You can use MyChart to ask questions about today's visit, request a non-urgent call back, or ask for a work or school excuse  for 24 hours related to this e-Visit. If it has been greater than 24 hours you will need to follow up with your provider, or enter a new e-Visit to address those concerns. You will get an e-mail in the next two days asking about your experience.  I hope that your e-visit has been valuable and will speed your recovery. Thank you for using e-visits.  I have spent 7 min in completion and review of this note-  Lacy Duverney [facility]

## 2018-12-31 ENCOUNTER — Telehealth: Payer: BC Managed Care – PPO | Admitting: Nurse Practitioner

## 2018-12-31 DIAGNOSIS — J069 Acute upper respiratory infection, unspecified: Secondary | ICD-10-CM

## 2018-12-31 MED ORDER — BENZONATATE 100 MG PO CAPS: 100.0000 mg | ORAL_CAPSULE | Freq: Two times a day (BID) | ORAL | 0 refills | Status: DC | PRN

## 2018-12-31 MED ORDER — PROMETHAZINE-DM 6.25-15 MG/5ML PO SYRP: 5.0000 mL | ORAL_SOLUTION | Freq: Four times a day (QID) | ORAL | 0 refills | Status: DC | PRN

## 2018-12-31 NOTE — Progress Notes (Signed)
have rx you a diferent cough med- I hope it helps you. Orders Placed This Encounter         promethazine-dextromethorphan (PROMETHAZINE-DM) 6.25-15 MG/5ML syrup         Sig: Take 5 mLs by mouth 4 (four) times daily as needed for cough.         Dispense:  118 mL         Refill:  0         Order Specific Question: Supervising Provider         Answer: Noemi Chapel [[id number]]

## 2018-12-31 NOTE — Addendum Note (Signed)
Addended by: Chevis Pretty on: 12/31/2018 12:36 PM   Modules accepted: Orders

## 2018-12-31 NOTE — Progress Notes (Signed)
E-Visit for Corona Virus Screening  Based on your current symptoms, it seems unlikely that your symptoms are related to the Coronavirus.   Coronavirus disease 2019 (COVID-19) is a respiratory illness that can spread from person to person. The virus that causes COVID-19 is a new virus that was first identified in the country of [place] but is now found in multiple other countries and has spread to the [place].  Symptoms associated with the virus are mild to severe fever, cough, and shortness of breath. There is currently no vaccine to protect against COVID-19, and there is no specific antiviral treatment for the virus.   To be considered HIGH RISK for Coronavirus (COVID-19), you have to meet the following criteria:  . Traveled to [place], [place], [place], [place] or [place]; or in the [place] to [place], [place], [place], or [place]; and have fever, cough, and shortness of breath within the last 2 weeks of travel OR  . Been in close contact with a person diagnosed with COVID-19 within the last 2 weeks and have fever, cough, and shortness of breath  . IF YOU DO NOT MEET THESE CRITERIA, YOU ARE CONSIDERED LOW RISK FOR COVID-19.   It is vitally important that if you feel that you have an infection such as this virus or any other virus that you stay home and away from places where you may spread it to others.  You should self-quarantine for 14 days if you have symptoms that could potentially be coronavirus and avoid contact with people age 30 and older.   You can use medication such as A prescription cough medication called Tessalon Perles 100 mg. You may take 1-2 capsules every 8 hours as needed for cough  You may also take acetaminophen (Tylenol) as needed for fever.   Reduce your risk of any infection by using the same precautions used for avoiding the common cold or flu:  Marland Kitchen Wash your hands often with soap and warm water for at least 20 seconds.  If soap and water are not readily  available, use an alcohol-based hand sanitizer with at least 60% alcohol.  . If coughing or sneezing, cover your mouth and nose by coughing or sneezing into the elbow areas of your shirt or coat, into a tissue or into your sleeve (not your hands). . Avoid shaking hands with others and consider head nods or verbal greetings only. . Avoid touching your eyes, nose, or mouth with unwashed hands.  . Avoid close contact with people who are sick. . Avoid places or events with large numbers of people in one location, like concerts or sporting events. . Carefully consider travel plans you have or are making. . If you are planning any travel outside or inside the [place], visit the CDC's Travelers' Health webpage for the latest health notices. . If you have some symptoms but not all symptoms, continue to monitor at home and seek medical attention if your symptoms worsen. . If you are having a medical emergency, call 911.  HOME CARE . Only take medications as instructed by your medical team. . Drink plenty of fluids and get plenty of rest. . A steam or ultrasonic humidifier can help if you have congestion.   GET HELP RIGHT AWAY IF: . You develop worsening fever. . You become short of breath . You cough up blood. . Your symptoms become more severe MAKE SURE YOU   Understand these instructions.  Will watch your condition.  Will get help  right away if you are not doing well or get worse.  Your e-visit answers were reviewed by a board certified advanced clinical practitioner to complete your personal care plan.  Depending on the condition, your plan could have included both over the counter or prescription medications.  If there is a problem please reply once you have received a response from your provider. Your safety is important to [place].  If you have drug allergies check your prescription carefully.    You can use MyChart to ask questions about today's visit, request a non-urgent call back, or ask for a  work or school excuse for 24 hours related to this e-Visit. If it has been greater than 24 hours you will need to follow up with your provider, or enter a new e-Visit to address those concerns. You will get an e-mail in the next two days asking about your experience.  I hope that your e-visit has been valuable and will speed your recovery. Thank you for using e-visits.   5 minutes spent reviewing and documenting in chart.

## 2019-01-05 ENCOUNTER — Telehealth: Payer: BC Managed Care – PPO | Admitting: Physician Assistant

## 2019-01-05 ENCOUNTER — Encounter: Payer: Self-pay | Admitting: Physician Assistant

## 2019-01-05 ENCOUNTER — Telehealth: Payer: Self-pay

## 2019-01-05 ENCOUNTER — Ambulatory Visit: Payer: Self-pay

## 2019-01-05 DIAGNOSIS — R05 Cough: Secondary | ICD-10-CM

## 2019-01-05 DIAGNOSIS — R059 Cough, unspecified: Secondary | ICD-10-CM

## 2019-01-05 DIAGNOSIS — R0789 Other chest pain: Secondary | ICD-10-CM

## 2019-01-05 NOTE — Telephone Encounter (Signed)
Please see nurse triage note from today. I have scheduled patient for tomorrow at 8:30am. Patient is not getting any relief from the [facility] or the Promethazine-DM syrup. Cough is worse when she is up. Patient states her throat feels like lava. Advised for patient to drink warm tea and patient also stated that she is taking HALLS cough drops.  Is there anything else she can do until appointment in the morning?  Please advise.

## 2019-01-05 NOTE — Progress Notes (Signed)
Based on what you shared with me, I feel your condition warrants further evaluation and I recommend that you be seen for a face to face office visit.  Catherine Washington,  Sorry you are not feeling well! According to review of your records you have had multiple similar complaints within the past 5 week. It is recommended that you have a face to face evaluation for further workup. This is especially important to rule out pneumonia or other causes, since your cough is not improving and you are now developing chest discomfort.     NOTE: If you entered your credit card information for this eVisit, you will not be charged. You may see a "hold" on your card for the $35 but that hold will drop off and you will not have a charge processed.  If you are having a true medical emergency please call 911.  If you need an urgent face to face visit, [facility] has four urgent care centers for your convenience.    PLEASE NOTE: THE [facility] LOCATIONS AND URGENT CARE CLINICS DO NOT HAVE THE TESTING FOR CORONAVIRUS COVID19 AVAILABLE.  IF YOU FEEL YOU NEED THIS TEST YOU MUST GO TO A TRIAGE LOCATION AT [facility]   [email or web address] to reserve your spot online an avoid wait times  [facility] [address] , [place] [address] Modified hours of operation: Monday-Friday, 10 AM to 6 PM  Saturday & Sunday 10 AM to 4 PM *Across the street from [place] (New Address!) [address], [place], [place] [address] *Just off [address], across the road from [place] hours of operation: Monday-Friday, 10 AM to 5 PM  Closed Saturday & Sunday   The following sites will take your insurance:  . [facility] [place] a Provider at this Location  [address] [place], [place] [address] . 10 am to 8 pm Monday-Friday . 12 pm to 8 pm  Saturday-Sunday   . [facility] [place] a Provider at this Location  [address] [place], [place], [place] [address] . 8 am to 8 pm Monday-Friday . 9 am to 6 pm Saturday . 11 am to 6 pm Sunday   . [facility] at [place] Get Driving Directions  [address] [place], [place] [address] . 8 am to 8 pm Monday-Friday . 8 am to 4 pm Saturday-Sunday   Your e-visit answers were reviewed by a board certified advanced clinical practitioner to complete your personal care plan.  Thank you for using e-Visits. I have spent 7 min in completion and review of this note- Lacy Duverney [facility]

## 2019-01-05 NOTE — Telephone Encounter (Signed)
No.  We can evaluate then.

## 2019-01-05 NOTE — Telephone Encounter (Signed)
Pt c/o congested cough with occasional phlegm (but not enough to spit out). Pt stated that she gets SOB after coughing episodes only.  Temperature is 99.4 today. Pt c/o chest soreness and SOB after cough episodes. Cough sounds congested and pt coughed majority of the time of the triage. Pt is an NICU nurse.Pt with no known covid exposures.    Care advice given and pt verbalized understanding. Pt verified email. Call transferred to office.   Reason for Disposition . ALSO, mild central chest pain occurs only when coughing  Answer Assessment - Initial Assessment Questions 1. ONSET: "When did the cough begin?"      12/29/18 2. SEVERITY: "How bad is the cough today?"     persistent cough worse with talking 3. RESPIRATORY DISTRESS: "Describe your breathing."     Cannot take a deep breath in - after coughing episode 4. FEVER: "Do you have a fever?" If so, ask: "What is your temperature, how was it measured, and when did it start?"   99.4 today 5. SPUTUM: "Describe the color of your sputum" (clear, white, yellow, green)     Not cough enough phlegm to tell what color it is  6. HEMOPTYSIS: "Are you coughing up any blood?" If so ask: "How much?" (flecks, streaks, tablespoons, etc.)     no 7. CARDIAC HISTORY: "Do you have any history of heart disease?" (e.g., heart attack, congestive heart failure)      no 8. LUNG HISTORY: "Do you have any history of lung disease?"  (e.g., pulmonary embolus, asthma, emphysema)     no 9. PE RISK FACTORS: "Do you have a history of blood clots?" (or: recent major surgery, recent prolonged travel, bedridden)     no 10. OTHER SYMPTOMS: "Do you have any other symptoms?" (e.g., runny nose, wheezing, chest pain)       Chest sore and hurts with cough, sore throat, SOB after coughing episodes 11. PREGNANCY: "Is there any chance you are pregnant?" "When was your last menstrual period?"       n/a 12. TRAVEL: "Have you traveled out of the country in the last month?" (e.g.,  travel history, exposures)      Not out of [facility] no known exposures  Protocols used: [place]

## 2019-01-05 NOTE — Telephone Encounter (Signed)
Called patient and let her know he will see her in the morning. Patient verbalized an understanding.

## 2019-01-06 ENCOUNTER — Other Ambulatory Visit: Payer: Self-pay

## 2019-01-06 ENCOUNTER — Ambulatory Visit (INDEPENDENT_AMBULATORY_CARE_PROVIDER_SITE_OTHER): Payer: BC Managed Care – PPO | Admitting: Family Medicine

## 2019-01-06 DIAGNOSIS — R05 Cough: Secondary | ICD-10-CM | POA: Diagnosis not present

## 2019-01-06 DIAGNOSIS — R059 Cough, unspecified: Secondary | ICD-10-CM

## 2019-01-06 MED ORDER — HYDROCODONE-HOMATROPINE 5-1.5 MG/5ML PO SYRP: 5.0000 mL | ORAL_SOLUTION | Freq: Four times a day (QID) | ORAL | 0 refills | Status: AC | PRN

## 2019-01-06 NOTE — Progress Notes (Signed)
Patient ID: Catherine Washington, female   DOB: [birth year], 43 y.o.   MRN: [id number]  Virtual Visit via Video Note  I connected with Catherine Washington on 01/06/19 at  8:30 AM EDT by a video enabled telemedicine application and verified that I am speaking with the correct person using two identifiers.  Location patient: home Location provider:work or home office Persons participating in the virtual visit: patient, provider  I discussed the limitations of evaluation and management by telemedicine and the availability of in person appointments. The patient expressed understanding and agreed to proceed.   HPI: Patient is non-smoker who is seen with 1 week history of cough.  She had actually done an E- visit yesterday and they had suggested possible face-to-face encounter.  Patient denies any sick contacts.  She states her maximum temperature has been 99.8 and is currently afebrile.  No chills.  She initially said she has some shortness of breath but this is basically after paroxysms of coughing.  She has not had any dyspnea with activity or at rest when she is not coughing.  She was prescribed Tessalon Perles and promethazine dextromethorphan cough syrup which have not helped.  Her main problem is getting to sleep because of severe cough.  Denies any nausea, vomiting, sore throat   ROS: See pertinent positives and negatives per HPI.  Past Medical History:  Diagnosis Date  . Allergy   . Chicken pox   . Depression   . Family history of adverse reaction to anesthesia    mother gets very nausaus  . Headache    occasionally  . Hyperlipidemia   . UTI (urinary tract infection)     Past Surgical History:  Procedure Laterality Date  . ABDOMINAL HYSTERECTOMY  2006   dysfunctional bleeding  . BREATH TEK H PYLORI N/A 12/06/2014   Procedure: BREATH TEK H PYLORI;  Surgeon: Greer Pickerel, MD;  Location: [facility];  Service: General;  Laterality: N/A;  . childbirth  1995, 2003  . CHOLECYSTECTOMY    . 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 Hyperlipidemia Mother   . Mental illness Mother   . Diabetes Mother        type 2 diabetes  . Hyperlipidemia Father   . Heart disease Father 2       MI  . Hypertension Father   . Diabetes Father   . Cancer Maternal Aunt        breast  . Diabetes Maternal Grandmother   . Cancer Paternal Grandmother        ovarian, lung  . Stroke Paternal Grandmother   . Diabetes Paternal Grandfather     SOCIAL HX: non-smoker   Current Outpatient Medications:  .  buPROPion (WELLBUTRIN) 100 MG tablet, Take 1 tablet (100 mg total) by mouth 3 (three) times daily., Disp: 90 tablet, Rfl: 11 .  CALCIUM CITRATE PO, Take by mouth. Takes 500mg  tablet TID., Disp: , Rfl:  .  HYDROcodone-homatropine (HYCODAN) 5-1.5 MG/5ML syrup, Take 5 mLs by mouth every 6 (six) hours as needed for up to 10 days., Disp: 120 mL, Rfl: 0 .  ipratropium (ATROVENT) 0.03 % nasal spray, Place 2 sprays into both nostrils every 12 (twelve) hours., Disp: 30 mL, Rfl: 0 .  LORazepam (ATIVAN) 0.5 MG tablet, Take 1 tablet (0.5 mg  total) by mouth at bedtime as needed for anxiety., Disp: 15 tablet, Rfl: 0 .  Multiple Vitamin (MULTIVITAMIN) tablet, Take 1 tablet by mouth daily., Disp: , Rfl:  .  promethazine-dextromethorphan (PROMETHAZINE-DM) 6.25-15 MG/5ML syrup, Take 5 mLs by mouth 4 (four) times daily as needed for cough., Disp: 118 mL, Rfl: 0 .  venlafaxine XR (EFFEXOR XR) 37.5 MG 24 hr capsule, Take 1 capsule (37.5 mg total) by mouth daily with breakfast., Disp: 30 capsule, Rfl: 11 .  vitamin B-12 (CYANOCOBALAMIN) 500 MCG tablet, Take 500 mcg by mouth daily., Disp: , Rfl:   EXAM:  VITALS per patient if applicable:  GENERAL: alert, oriented, appears well and in no acute distress  HEENT: atraumatic,  conjunttiva clear, no obvious abnormalities on inspection of external nose and ears  NECK: normal movements of the head and neck  LUNGS: on inspection no signs of respiratory distress, breathing rate appears normal, no obvious gross SOB, gasping or wheezing  CV: no obvious cyanosis  MS: moves all visible extremities without noticeable abnormality  PSYCH/NEURO: pleasant and cooperative, no obvious depression or anxiety, speech and thought processing grossly intact  ASSESSMENT AND PLAN:  Discussed the following assessment and plan:  Cough.  ?acute viral bronchitis.  She does not have any current fever and no dyspnea. -Hycodan cough syrup one tsp po q 6 hours prn cough -follow up for any fever, increased shortness of breath, or other concerns.    I discussed the assessment and treatment plan with the patient. The patient was provided an opportunity to ask questions and all were answered. The patient agreed with the plan and demonstrated an understanding of the instructions.   The patient was advised to call back or seek an in-person evaluation if the symptoms worsen or if the condition fails to improve as anticipated.   Carolann Littler, MD

## 2019-01-10 ENCOUNTER — Telehealth: Payer: BC Managed Care – PPO | Admitting: Nurse Practitioner

## 2019-01-10 DIAGNOSIS — R05 Cough: Secondary | ICD-10-CM

## 2019-01-10 DIAGNOSIS — R059 Cough, unspecified: Secondary | ICD-10-CM

## 2019-01-10 MED ORDER — BENZONATATE 100 MG PO CAPS: 100.0000 mg | ORAL_CAPSULE | Freq: Three times a day (TID) | ORAL | 0 refills | Status: DC | PRN

## 2019-01-10 MED ORDER — DOXYCYCLINE HYCLATE 100 MG PO TABS: 100.0000 mg | ORAL_TABLET | Freq: Two times a day (BID) | ORAL | 0 refills | Status: DC

## 2019-01-10 NOTE — Progress Notes (Signed)
We are sorry that you are not feeling well.  Here is how we plan to help!  Based on your presentation I believe you most likely have A cough due to bacteria.  When patients have a fever and a productive cough with a change in color or increased sputum production, we are concerned about bacterial bronchitis.  If left untreated it can progress to pneumonia.  If your symptoms do not improve with your treatment plan it is important that you contact your provider.   I have prescribed Doxycycline 100 mg twice a day for 7 days     In addition you may use A prescription cough medication called Tessalon Perles 100mg. You may take 1-2 capsules every 8 hours as needed for your cough.    From your responses in the eVisit questionnaire you describe inflammation in the upper respiratory tract which is causing a significant cough.  This is commonly called Bronchitis and has four common causes:    Allergies  Viral Infections  Acid Reflux  Bacterial Infection Allergies, viruses and acid reflux are treated by controlling symptoms or eliminating the cause. An example might be a cough caused by taking certain blood pressure medications. You stop the cough by changing the medication. Another example might be a cough caused by acid reflux. Controlling the reflux helps control the cough.  USE OF BRONCHODILATOR ("RESCUE") INHALERS: There is a risk from using your bronchodilator too frequently.  The risk is that over-reliance on a medication which only relaxes the muscles surrounding the breathing tubes can reduce the effectiveness of medications prescribed to reduce swelling and congestion of the tubes themselves.  Although you feel brief relief from the bronchodilator inhaler, your asthma may actually be worsening with the tubes becoming more swollen and filled with mucus.  This can delay other crucial treatments, such as oral steroid medications. If you need to use a bronchodilator inhaler daily, several times per  day, you should discuss this with your provider.  There are probably better treatments that could be used to keep your asthma under control.     HOME CARE . Only take medications as instructed by your medical team. . Complete the entire course of an antibiotic. . Drink plenty of fluids and get plenty of rest. . Avoid close contacts especially the very young and the elderly . Cover your mouth if you cough or cough into your sleeve. . Always remember to wash your hands . A steam or ultrasonic humidifier can help congestion.   GET HELP RIGHT AWAY IF: . You develop worsening fever. . You become short of breath . You cough up blood. . Your symptoms persist after you have completed your treatment plan MAKE SURE YOU   Understand these instructions.  Will watch your condition.  Will get help right away if you are not doing well or get worse.  Your e-visit answers were reviewed by a board certified advanced clinical practitioner to complete your personal care plan.  Depending on the condition, your plan could have included both over the counter or prescription medications. If there is a problem please reply  once you have received a response from your provider. Your safety is important to us.  If you have drug allergies check your prescription carefully.    You can use MyChart to ask questions about today's visit, request a non-urgent call back, or ask for a work or school excuse for 24 hours related to this e-Visit. If it has been greater than 24 hours   you will need to follow up with your provider, or enter a new e-Visit to address those concerns. You will get an e-mail in the next two days asking about your experience.  I hope that your e-visit has been valuable and will speed your recovery. Thank you for using e-visits.  5-10 minutes spent reviewing and documenting in chart.

## 2019-01-19 ENCOUNTER — Telehealth: Payer: Self-pay | Admitting: *Deleted

## 2019-01-19 MED ORDER — HYDROCODONE-HOMATROPINE 5-1.5 MG/5ML PO SYRP: 5.0000 mL | ORAL_SOLUTION | Freq: Four times a day (QID) | ORAL | 0 refills | Status: AC | PRN

## 2019-01-19 NOTE — Addendum Note (Signed)
Addended by: Eulas Post on: 01/19/2019 03:00 PM   Modules accepted: Orders

## 2019-01-19 NOTE — Telephone Encounter (Signed)
Called patient and she stated that she still has a dry cough and that it was so bad over the weekend that she developed back and chest pain. Patient stated that the Hycodan syrup helped. I see that she had an E-visit after her visit with Dr. Elease Hashimoto and she was prescribed the Tessalon Cough perles.  Patient would like more cough medicine. Please advise.

## 2019-01-19 NOTE — Telephone Encounter (Signed)
Copied from [place] [id number]. Topic: General - Other >> Jan 19, 2019  2:05 PM Mcneil, Ja-Kwan wrote: Reason for CRM: Pt stated she is not sure if another appt is needed or if a Rx for a cough medication can be sent to her pharmacy. Pt requests call back to advise.

## 2019-01-19 NOTE — Telephone Encounter (Signed)
I can refill once.  If cough not improving after that needs further evaluation in office.

## 2019-01-20 NOTE — Telephone Encounter (Signed)
Called patient and let her know that if the Hycodan syrup is not helping her cough improve then she needs to have an in office visit for further evaluation. Patient verbalized an understanding.

## 2019-02-01 ENCOUNTER — Other Ambulatory Visit: Payer: Self-pay | Admitting: Family Medicine

## 2019-02-01 DIAGNOSIS — F339 Major depressive disorder, recurrent, unspecified: Secondary | ICD-10-CM

## 2019-02-02 ENCOUNTER — Ambulatory Visit (INDEPENDENT_AMBULATORY_CARE_PROVIDER_SITE_OTHER): Payer: BC Managed Care – PPO | Admitting: Family Medicine

## 2019-02-02 ENCOUNTER — Other Ambulatory Visit: Payer: Self-pay

## 2019-02-02 DIAGNOSIS — F339 Major depressive disorder, recurrent, unspecified: Secondary | ICD-10-CM

## 2019-02-02 MED ORDER — VENLAFAXINE HCL ER 37.5 MG PO CP24: 37.5000 mg | ORAL_CAPSULE | Freq: Every day | ORAL | 3 refills | Status: DC

## 2019-02-02 MED ORDER — BUPROPION HCL 100 MG PO TABS: 100.0000 mg | ORAL_TABLET | Freq: Three times a day (TID) | ORAL | 3 refills | Status: DC

## 2019-02-02 NOTE — Progress Notes (Signed)
Patient ID: Catherine Washington, female   DOB: 17-Oct-1975, 43 y.o.   MRN: [id number]  This visit type was conducted due to national recommendations for restrictions regarding the COVID-19 pandemic in an effort to limit this patient's exposure and mitigate transmission in our community.   Virtual Visit via Video Note  I connected with Catherine Washington on 02/02/19 at  1:30 PM EDT by a video enabled telemedicine application and verified that I am speaking with the correct person using two identifiers.  Location patient: home Location provider:work or home office Persons participating in the virtual visit: patient, provider  I discussed the limitations of evaluation and management by telemedicine and the availability of in person appointments. The patient expressed understanding and agreed to proceed.   HPI: Patient has history of recurrent depression.  She is currently on regimen of Wellbutrin 100 mg 3 times daily and Effexor XR 37.5 mg daily.  That seems to be working well for her.  She denies any side effects.  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  She feels her depression is stable.  She still has some lingering dry cough from recent illness.  Duration about 4 weeks.  No obvious wheezing.  No dyspnea.  No fever.  Overall improving.   ROS: See pertinent positives and negatives per HPI.  Past Medical History:  Diagnosis Date  . Allergy   . Chicken pox   . Depression   . Family history of adverse reaction to anesthesia    mother gets very nausaus  . Headache    occasionally  . Hyperlipidemia   . UTI (urinary tract infection)     Past Surgical History:  Procedure Laterality Date  . ABDOMINAL HYSTERECTOMY  2006   dysfunctional bleeding  . BREATH TEK H PYLORI N/A 12/06/2014   Procedure: BREATH TEK H PYLORI;  Surgeon: Greer Pickerel, MD;  Location: [facility];  Service: General;  Laterality: N/A;  . childbirth  1995, 2003  . CHOLECYSTECTOMY    . 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 Hyperlipidemia Mother   . Mental illness Mother   . Diabetes Mother        type 2 diabetes  . Hyperlipidemia Father   . Heart disease Father 70       MI  . Hypertension Father   . Diabetes Father   . Cancer Maternal Aunt        breast  . Diabetes Maternal Grandmother   . Cancer Paternal Grandmother        ovarian, lung  . Stroke Paternal Grandmother   . Diabetes Paternal Grandfather     SOCIAL HX: Non-smoker   Current Outpatient Medications:  .  buPROPion (WELLBUTRIN) 100 MG tablet, Take 1 tablet (100 mg total) by mouth 3 (three) times daily., Disp: 270 tablet, Rfl: 3 .  CALCIUM CITRATE PO, Take by mouth. Takes 500mg  tablet TID., Disp: , Rfl:  .  ipratropium (ATROVENT) 0.03 % nasal spray, Place 2 sprays into both nostrils every 12 (twelve) hours., Disp: 30 mL, Rfl: 0 .  LORazepam (ATIVAN) 0.5 MG tablet, Take 1 tablet (0.5 mg total) by mouth at bedtime as needed for anxiety., Disp: 15 tablet, Rfl: 0 .  Multiple Vitamin (MULTIVITAMIN) tablet, Take 1 tablet by mouth daily.,  Disp: , Rfl:  .  venlafaxine XR (EFFEXOR XR) 37.5 MG 24 hr capsule, Take 1 capsule (37.5 mg total) by mouth daily with breakfast., Disp: 90 capsule, Rfl: 3 .  vitamin B-12 (CYANOCOBALAMIN) 500 MCG tablet, Take 500 mcg by mouth daily., Disp: , Rfl:   EXAM:  VITALS per patient if applicable:  GENERAL: alert, oriented, appears well and in no acute distress  HEENT: atraumatic, conjunttiva clear, no obvious abnormalities on inspection of external nose and ears  NECK: normal movements of the head and neck  LUNGS: on inspection no signs of respiratory distress, breathing rate appears normal, no obvious gross SOB, gasping or wheezing  CV: no obvious cyanosis  MS: moves all visible  extremities without noticeable abnormality  PSYCH/NEURO: pleasant and cooperative, no obvious depression or anxiety, speech and thought processing grossly intact  ASSESSMENT AND PLAN:  Discussed the following assessment and plan:  #1 history of recurrent depression currently stable on Wellbutrin and Effexor  -Refilled both medications for 1 year -Follow-up for any recurrent or increased depression symptoms  #2 dry cough-suspect recent viral infection.  Overall improving     I discussed the assessment and treatment plan with the patient. The patient was provided an opportunity to ask questions and all were answered. The patient agreed with the plan and demonstrated an understanding of the instructions.   The patient was advised to call back or seek an in-person evaluation if the symptoms worsen or if the condition fails to improve as anticipated.   Carolann Littler, MD

## 2019-02-02 NOTE — Telephone Encounter (Signed)
Patient has a Doxy visit today

## 2019-11-28 ENCOUNTER — Ambulatory Visit: Payer: BC Managed Care – PPO | Attending: Internal Medicine

## 2019-11-28 DIAGNOSIS — Z23 Encounter for immunization: Secondary | ICD-10-CM

## 2019-11-28 NOTE — Progress Notes (Signed)
   Covid-19 Vaccination Clinic  Name:  Catherine Washington    MRN: [id number] DOB: 1976/01/31  11/28/2019  [name] was observed post Covid-19 immunization for 15 minutes without incident. She was provided with Vaccine Information Sheet and instruction to access the V-Safe system.   [name] was instructed to call 911 with any severe reactions post vaccine: Marland Kitchen Difficulty breathing  . Swelling of face and throat  . A fast heartbeat  . A bad rash all over body  . Dizziness and weakness   Immunizations Administered    Name Date Dose VIS Date Route   Pfizer COVID-19 Vaccine 11/28/2019  9:05 AM 0.3 mL 08/28/2019 Intramuscular   Manufacturer: [place]   Lot: [id number]   [place]: [id number]

## 2019-12-21 ENCOUNTER — Ambulatory Visit: Payer: BC Managed Care – PPO | Attending: Internal Medicine

## 2019-12-21 ENCOUNTER — Ambulatory Visit: Payer: BC Managed Care – PPO

## 2019-12-21 DIAGNOSIS — Z23 Encounter for immunization: Secondary | ICD-10-CM

## 2019-12-21 NOTE — Progress Notes (Signed)
   Covid-19 Vaccination Clinic  Name:  Catherine Washington    MRN: [id number] DOB: 22-Dec-1975  12/21/2019  [name] was observed post Covid-19 immunization for 15 minutes without incident. She was provided with Vaccine Information Sheet and instruction to access the V-Safe system.   [name] was instructed to call 911 with any severe reactions post vaccine: Marland Kitchen Difficulty breathing  . Swelling of face and throat  . A fast heartbeat  . A bad rash all over body  . Dizziness and weakness   Immunizations Administered    Name Date Dose VIS Date Route   Pfizer COVID-19 Vaccine 12/21/2019  3:37 PM 0.3 mL 08/28/2019 Intramuscular   Manufacturer: [place]   Lot: [id number]   [place]: [id number]

## 2020-01-29 ENCOUNTER — Other Ambulatory Visit: Payer: Self-pay | Admitting: Family Medicine

## 2020-01-29 DIAGNOSIS — F339 Major depressive disorder, recurrent, unspecified: Secondary | ICD-10-CM

## 2020-02-09 ENCOUNTER — Other Ambulatory Visit: Payer: Self-pay | Admitting: Family Medicine

## 2020-03-01 ENCOUNTER — Other Ambulatory Visit: Payer: Self-pay | Admitting: Family Medicine

## 2020-03-01 DIAGNOSIS — F339 Major depressive disorder, recurrent, unspecified: Secondary | ICD-10-CM

## 2020-03-01 NOTE — Telephone Encounter (Signed)
Lvm for pt to call back pt needs an appt

## 2020-03-17 ENCOUNTER — Telehealth: Payer: No Typology Code available for payment source | Admitting: Emergency Medicine

## 2020-03-17 DIAGNOSIS — R300 Dysuria: Secondary | ICD-10-CM

## 2020-03-17 MED ORDER — CEPHALEXIN 500 MG PO CAPS: 500.0000 mg | ORAL_CAPSULE | Freq: Two times a day (BID) | ORAL | 0 refills | Status: DC

## 2020-03-17 NOTE — Progress Notes (Signed)

## 2020-03-30 ENCOUNTER — Telehealth (INDEPENDENT_AMBULATORY_CARE_PROVIDER_SITE_OTHER): Payer: No Typology Code available for payment source | Admitting: Family Medicine

## 2020-03-30 DIAGNOSIS — F339 Major depressive disorder, recurrent, unspecified: Secondary | ICD-10-CM

## 2020-03-30 MED ORDER — BUPROPION HCL 100 MG PO TABS: 100.0000 mg | ORAL_TABLET | Freq: Three times a day (TID) | ORAL | 3 refills | Status: DC

## 2020-03-30 MED ORDER — VENLAFAXINE HCL ER 37.5 MG PO CP24: 37.5000 mg | ORAL_CAPSULE | Freq: Every day | ORAL | 3 refills | Status: DC

## 2020-03-30 NOTE — Progress Notes (Signed)
Patient ID: Catherine Washington, female   DOB: 05-07-1976, 44 y.o.   MRN: [id number]  This visit type was conducted due to national recommendations for restrictions regarding the COVID-19 pandemic in an effort to limit this patient's exposure and mitigate transmission in our community.   Virtual Visit via Video Note  I connected with Catherine Washington on 03/30/20 at  9:30 AM EDT by a video enabled telemedicine application and verified that I am speaking with the correct person using two identifiers.  Location patient: home Location provider:work or home office Persons participating in the virtual visit: patient, provider  I discussed the limitations of evaluation and management by telemedicine and the availability of in person appointments. The patient expressed understanding and agreed to proceed.   HPI: Catherine Washington was seen by a video encounter for follow-up regarding depression.  She had history of recurrent depression.  She has had gastric bypass surgery previously which restricts which medications she can use.  She had been on combination therapy with Wellbutrin 100 mg 3 times daily and Effexor 37.5 mg once daily and this combination seem to be working well for her.  Her only issue is she does have some chronic insomnia.  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  She is currently in a management position and daytime work only.  She is on melatonin 5 mg at night and Benadryl 50 mg at night which does help somewhat.  She feels her depression is stable.   ROS: See pertinent positives and negatives per HPI.  Past Medical History:  Diagnosis Date  . Allergy   . Chicken pox   . Depression   . Family history of adverse reaction to anesthesia    mother gets very nausaus  . Headache    occasionally  . Hyperlipidemia   . UTI (urinary tract infection)     Past Surgical History:  Procedure Laterality Date  . ABDOMINAL HYSTERECTOMY  2006   dysfunctional bleeding   . BREATH TEK H PYLORI N/A 12/06/2014   Procedure: BREATH TEK H PYLORI;  Surgeon: Greer Pickerel, MD;  Location: [facility];  Service: General;  Laterality: N/A;  . childbirth  1995, 2003  . CHOLECYSTECTOMY    . 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   Procedure: LAPAROSCOPIC ROUX-EN-Y GASTRIC BYPASS WITH UPPER ENDOSCOPY;  Surgeon: Greer Pickerel, MD;  Location: [facility];  Service: General;  Laterality: N/A;    Family History  Problem Relation Age of Onset  . Hyperlipidemia Mother   . Mental illness Mother   . Diabetes Mother        type 2 diabetes  . Hyperlipidemia Father   . Heart disease Father 61       MI  . Hypertension Father   . Diabetes Father   . Cancer Maternal Aunt        breast  . Diabetes Maternal Grandmother   . Cancer Paternal Grandmother        ovarian, lung  . Stroke Paternal Grandmother   . Diabetes Paternal Grandfather     SOCIAL HX: Non-smoker.   Current Outpatient Medications:  .  buPROPion (WELLBUTRIN) 100 MG tablet, Take 1 tablet (100 mg total) by mouth 3 (three) times daily., Disp: 270 tablet, Rfl: 3 .  CALCIUM CITRATE PO, Take by mouth. Takes 500mg  tablet TID., Disp: , Rfl:  .  cephALEXin (KEFLEX) 500 MG capsule, Take 1 capsule (500 mg total) by mouth 2 (two) times daily., Disp: 14 capsule, Rfl:  0 .  ipratropium (ATROVENT) 0.03 % nasal spray, Place 2 sprays into both nostrils every 12 (twelve) hours., Disp: 30 mL, Rfl: 0 .  LORazepam (ATIVAN) 0.5 MG tablet, Take 1 tablet (0.5 mg total) by mouth at bedtime as needed for anxiety., Disp: 15 tablet, Rfl: 0 .  Multiple Vitamin (MULTIVITAMIN) tablet, Take 1 tablet by mouth daily., Disp: , Rfl:  .  venlafaxine XR (EFFEXOR-XR) 37.5 MG 24 hr capsule, Take 1 capsule (37.5 mg total) by mouth daily with breakfast., Disp: 90 capsule, Rfl: 3 .  vitamin B-12 (CYANOCOBALAMIN) 500 MCG tablet, Take 500 mcg by mouth daily., Disp: , Rfl:   EXAM:  VITALS per patient if applicable:  GENERAL: alert, oriented, appears well and in no  acute distress  HEENT: atraumatic, conjunttiva clear, no obvious abnormalities on inspection of external nose and ears  NECK: normal movements of the head and neck  LUNGS: on inspection no signs of respiratory distress, breathing rate appears normal, no obvious gross SOB, gasping or wheezing  CV: no obvious cyanosis  MS: moves all visible extremities without noticeable abnormality  PSYCH/NEURO: pleasant and cooperative, no obvious depression or anxiety, speech and thought processing grossly intact  ASSESSMENT AND PLAN:  Discussed the following assessment and plan:  #1 recurrent depression-stable  -Refill Effexor and bupropion for 1 year  #2 chronic insomnia -Continue over-the-counter medications including melatonin and Benadryl.     I discussed the assessment and treatment plan with the patient. The patient was provided an opportunity to ask questions and all were answered. The patient agreed with the plan and demonstrated an understanding of the instructions.   The patient was advised to call back or seek an in-person evaluation if the symptoms worsen or if the condition fails to improve as anticipated.     Carolann Littler, MD

## 2020-05-24 ENCOUNTER — Encounter: Payer: Self-pay | Admitting: Family Medicine

## 2020-06-17 ENCOUNTER — Other Ambulatory Visit: Payer: Self-pay | Admitting: Obstetrics and Gynecology

## 2020-06-17 DIAGNOSIS — R928 Other abnormal and inconclusive findings on diagnostic imaging of breast: Secondary | ICD-10-CM

## 2020-06-27 ENCOUNTER — Ambulatory Visit
Admission: RE | Admit: 2020-06-27 | Discharge: 2020-06-27 | Disposition: A | Discharge status: Home / Self Care | Payer: No Typology Code available for payment source | Source: Ambulatory Visit | Attending: Obstetrics and Gynecology | Admitting: Obstetrics and Gynecology

## 2020-06-27 ENCOUNTER — Other Ambulatory Visit: Payer: Self-pay

## 2020-06-27 ENCOUNTER — Other Ambulatory Visit: Payer: Self-pay | Admitting: Obstetrics and Gynecology

## 2020-06-27 DIAGNOSIS — R928 Other abnormal and inconclusive findings on diagnostic imaging of breast: Secondary | ICD-10-CM

## 2020-06-27 DIAGNOSIS — N6320 Unspecified lump in the left breast, unspecified quadrant: Secondary | ICD-10-CM

## 2020-08-03 ENCOUNTER — Ambulatory Visit (INDEPENDENT_AMBULATORY_CARE_PROVIDER_SITE_OTHER): Payer: No Typology Code available for payment source | Admitting: Family Medicine

## 2020-08-03 ENCOUNTER — Other Ambulatory Visit: Payer: Self-pay

## 2020-08-03 ENCOUNTER — Encounter: Payer: Self-pay | Admitting: Family Medicine

## 2020-08-03 VITALS — BP 139/89 | HR 81 | Ht 64.0 in | Wt 146.0 lb

## 2020-08-03 DIAGNOSIS — G44209 Tension-type headache, unspecified, not intractable: Secondary | ICD-10-CM

## 2020-08-03 MED ORDER — CYCLOBENZAPRINE HCL 5 MG PO TABS: ORAL_TABLET | ORAL | 0 refills | Status: DC

## 2020-08-03 NOTE — Progress Notes (Signed)
Established Patient Office Visit  Subjective:  Patient ID: Catherine Washington, female    DOB: 1976-07-01  Age: 44 y.o. MRN: [id number]  CC:  Chief Complaint  Patient presents with  . Headache    HPI Catherine Washington presents for headache for the past 6 days.  She states that this past Sunday she had what sounds like a typical migraine but for the most part this headache is different from her migraines.  She has a dull headache which is relatively continuous and radiates from the occiput for bilaterally to the frontal region.  This is currently about 4 out of 10 severity.  She has photosensitivity and nausea with her migraines but none with this headache.  She has taken Tylenol just couple occasions which dulled her headache.  No fever.  No sinusitis symptoms.  No visual changes.  No exertional headache.  No clear triggering factors.  She feels like she has increased neck and muscle stiffness.  No recent injury.  She did have couple episodes of accompanying dizziness.  No syncope.  No vertigo symptoms.  She describes some lightheadedness.  This seemed to improve after squatting down.  She is not dizzy at this time.  Past Medical History:  Diagnosis Date  . Allergy   . Chicken pox   . Depression   . Family history of adverse reaction to anesthesia    mother gets very nausaus  . Headache    occasionally  . Hyperlipidemia   . UTI (urinary tract infection)     Past Surgical History:  Procedure Laterality Date  . ABDOMINAL HYSTERECTOMY  2006   dysfunctional bleeding  . BREATH TEK H PYLORI N/A 12/06/2014   Procedure: BREATH TEK H PYLORI;  Surgeon: Eric Wilson, MD;  Location: [facility];  Service: General;  Laterality: N/A;  . childbirth  1995, 2003  . CHOLECYSTECTOMY    . 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   Procedure: LAPAROSCOPIC ROUX-EN-Y GASTRIC BYPASS WITH UPPER ENDOSCOPY;  Surgeon: Eric Wilson, MD;  Location: [facility];  Service: General;  Laterality: N/A;    Family History  Problem  Relation Age of Onset  . Hyperlipidemia Mother   . Mental illness Mother   . Diabetes Mother        type 2 diabetes  . Hyperlipidemia Father   . Heart disease Father 60       MI  . Hypertension Father   . Diabetes Father   . Cancer Maternal Aunt        breast  . Breast cancer Maternal Aunt        40 's & 50's  . Diabetes Maternal Grandmother   . Cancer Paternal Grandmother        ovarian, lung  . Stroke Paternal Grandmother   . Diabetes Paternal Grandfather     Social History   Socioeconomic History  . Marital status: Married    Spouse name: Not on file  . Number of children: Not on file  . Years of education: Not on file  . Highest education level: Not on file  Occupational History  . Not on file  Tobacco Use  . Smoking status: Never Smoker  . Smokeless tobacco: Never Used  Substance and Sexual Activity  . Alcohol use: No  . Drug use: No  . Sexual activity: Not on file  Other Topics Concern  . Not on file  Social History Narrative   Has 2 children  10 and 15 non smoker    Social Determinants  of Health   Financial Resource Strain:   . Difficulty of Paying Living Expenses: Not on file  Food Insecurity:   . Worried About Charity fundraiser in the Last Year: Not on file  . Ran Out of Food in the Last Year: Not on file  Transportation Needs:   . Lack of Transportation (Medical): Not on file  . Lack of Transportation (Non-Medical): Not on file  Physical Activity:   . Days of Exercise per Week: Not on file  . Minutes of Exercise per Session: Not on file  Stress:   . Feeling of Stress : Not on file  Social Connections:   . Frequency of Communication with Friends and Family: Not on file  . Frequency of Social Gatherings with Friends and Family: Not on file  . Attends Religious Services: Not on file  . Active Member of Clubs or Organizations: Not on file  . Attends Archivist Meetings: Not on file  . Marital Status: Not on file  Intimate Partner  Violence:   . Fear of Current or Ex-Partner: Not on file  . Emotionally Abused: Not on file  . Physically Abused: Not on file  . Sexually Abused: Not on file    Outpatient Medications Prior to Visit  Medication Sig Dispense Refill  . buPROPion (WELLBUTRIN) 100 MG tablet Take 1 tablet (100 mg total) by mouth 3 (three) times daily. 270 tablet 3  . CALCIUM CITRATE PO Take by mouth. Takes 500mg  tablet TID.    . Multiple Vitamin (MULTIVITAMIN) tablet Take 1 tablet by mouth daily.    Marland Kitchen venlafaxine XR (EFFEXOR-XR) 37.5 MG 24 hr capsule Take 1 capsule (37.5 mg total) by mouth daily with breakfast. 90 capsule 3  . vitamin B-12 (CYANOCOBALAMIN) 500 MCG tablet Take 500 mcg by mouth daily.    . cephALEXin (KEFLEX) 500 MG capsule Take 1 capsule (500 mg total) by mouth 2 (two) times daily. 14 capsule 0  . ipratropium (ATROVENT) 0.03 % nasal spray Place 2 sprays into both nostrils every 12 (twelve) hours. 30 mL 0  . LORazepam (ATIVAN) 0.5 MG tablet Take 1 tablet (0.5 mg total) by mouth at bedtime as needed for anxiety. 15 tablet 0   No facility-administered medications prior to visit.    Allergies  Allergen Reactions  . Adhesive [Tape] Other (See Comments)    Blisters  . Nitrofurantoin Monohyd Macro     GI upset    ROS Review of Systems  Constitutional: Negative for appetite change, chills, fever and unexpected weight change.  HENT: Negative for congestion, sinus pressure and sinus pain.   Neurological: Positive for headaches.      Objective:    Physical Exam Vitals reviewed.  Constitutional:      Appearance: She is well-developed.  Eyes:     Pupils: Pupils are equal, round, and reactive to light.  Cardiovascular:     Rate and Rhythm: Normal rate and regular rhythm.  Pulmonary:     Effort: Pulmonary effort is normal.     Breath sounds: Normal breath sounds.  Musculoskeletal:     Cervical back: Neck supple.  Neurological:     Mental Status: She is alert.     Cranial Nerves: No  cranial nerve deficit.     Motor: No weakness.     Coordination: Coordination normal.     BP 139/89   Pulse 81   Ht 5\' 4"  (1.626 m)   Wt 146 lb (66.2 kg)   BMI 25.06 kg/m  Wt Readings from Last 3 Encounters:  08/03/20 146 lb (66.2 kg)  02/24/15 239 lb (108.4 kg)  10/11/14 238 lb (108 kg)     Health Maintenance Due  Topic Date Due  . Hepatitis C Screening  Never done  . HIV Screening  Never done  . PAP SMEAR-Modifier  01/24/2020  . INFLUENZA VACCINE  04/17/2020    There are no preventive care reminders to display for this patient.  Lab Results  Component Value Date   TSH 2.71 10/05/2014   Lab Results  Component Value Date   WBC 9.0 03/09/2015   HGB 10.7 (L) 03/09/2015   HCT 33.3 (L) 03/09/2015   MCV 86.5 03/09/2015   PLT 365 03/09/2015   Lab Results  Component Value Date   NA 136 03/08/2015   K 3.8 03/08/2015   CO2 26 03/08/2015   GLUCOSE 125 (H) 03/08/2015   BUN 6 03/08/2015   CREATININE 0.85 03/08/2015   BILITOT 0.5 03/08/2015   ALKPHOS 52 03/08/2015   AST 25 03/08/2015   ALT 24 03/08/2015   PROT 6.7 03/08/2015   ALBUMIN 3.8 03/08/2015   CALCIUM 8.7 (L) 03/08/2015   ANIONGAP 6 03/08/2015   GFR 84.07 10/05/2014   Lab Results  Component Value Date   CHOL 189 10/05/2014   Lab Results  Component Value Date   HDL 44.10 10/05/2014   Lab Results  Component Value Date   LDLCALC 106 (H) 10/05/2014   Lab Results  Component Value Date   TRIG 193.0 (H) 10/05/2014   Lab Results  Component Value Date   CHOLHDL 4 10/05/2014   No results found for: HGBA1C    Assessment & Plan:   Felipa is seen with 6-day history of dull bilateral headache.  This sounds consistent with likely tension headache.  She does have history of reported migraines but this headache is different  -We discussed conservative measures such as heat, topical sports creams, avoidance of regular analgesics  -Consider short-term trial of Flexeril 5 mg 1-2 nightly  -Touch base if  headache persists or she develops any new symptoms over the next several days  Meds ordered this encounter  Medications  . cyclobenzaprine (FLEXERIL) 5 MG tablet    Sig: Take one to two at night by mouth as needed for tension headache    Dispense:  30 tablet    Refill:  0    Follow-up: No follow-ups on file.    Carolann Littler, MD

## 2020-08-03 NOTE — Patient Instructions (Signed)
Tension Headache, Adult A tension headache is a feeling of pain, pressure, or aching in the head that is often felt over the front and sides of the head. The pain can be dull, or it can feel tight (constricting). There are two types of tension headache:  Episodic tension headache. This is when the headaches happen fewer than 15 days a month.  Chronic tension headache. This is when the headaches happen more than 15 days a month during a 45-month period. A tension headache can last from 30 minutes to several days. It is the most common kind of headache. Tension headaches are not normally associated with nausea or vomiting, and they do not get worse with physical activity. What are the causes? The exact cause of this condition is not known. Tension headaches are often triggered by stress, anxiety, or depression. Other triggers include:  Alcohol.  Too much caffeine or caffeine withdrawal.  Respiratory infections, such as colds, flu, or sinus infections.  Dental problems or teeth clenching.  Tiredness (fatigue).  Holding your head and neck in the same position for a long period of time, such as while using a computer.  Smoking.  Arthritis of the neck. What are the signs or symptoms? Symptoms of this condition include:  A feeling of pressure or tightness around the head.  Dull, aching head pain.  Pain over the front and sides of the head.  Tenderness in the muscles of the head, neck, and shoulders. How is this diagnosed? This condition may be diagnosed based on your symptoms, your medical history, and a physical exam. If your symptoms are severe or unusual, you may have imaging tests, such as a CT scan or an MRI of your head. Your vision may also be checked. How is this treated? This condition may be treated with lifestyle changes and with medicines that help relieve symptoms. Follow these instructions at home: Managing pain  Take over-the-counter and prescription medicines only as  told by your health care provider.  When you have a headache, lie down in a dark, quiet room.  If directed, apply ice to the head and neck: ? Put ice in a plastic bag. ? Place a towel between your skin and the bag. ? Leave the ice on for 20 minutes, 2-3 times a day.  If directed, apply heat to the back of your neck as often as told by your health care provider. Use the heat source that your health care provider recommends, such as a moist heat pack or a heating pad. ? Place a towel between your skin and the heat source. ? Leave the heat on for 20-30 minutes. ? Remove the heat if your skin turns bright red. This is especially important if you are unable to feel pain, heat, or cold. You may have a greater risk of getting burned. Eating and drinking  Eat meals on a regular schedule.  Limit alcohol intake to no more than 1 drink a day for nonpregnant women and 2 drinks a day for men. One drink equals 12 oz of beer, 5 oz of wine, or 1 oz of hard liquor.  Drink enough fluid to keep your urine pale yellow.  Decrease your caffeine intake, or stop using caffeine. Lifestyle  Get 7-9 hours of sleep each night, or get the amount of sleep recommended by your health care provider.  At bedtime, remove all electronic devices from your room. Electronic devices include computers, phones, and tablets.  Find ways to manage your stress. Some things  that can help relieve stress include: ? Exercise. ? Deep breathing exercises. ? Yoga. ? Listening to music. ? Positive mental imagery.  Try to sit up straight and avoid tensing your muscles.  Do not use any products that contain nicotine or tobacco, such as cigarettes and e-cigarettes. If you need help quitting, ask your health care provider. General instructions   Keep all follow-up visits as told by your health care provider. This is important.  Avoid any headache triggers. Keep a headache journal to help find out what may trigger your headaches.  For example, write down: ? What you eat and drink. ? How much sleep you get. ? Any change to your diet or medicines. Contact a health care provider if:  Your headache does not get better.  Your headache comes back.  You are sensitive to sounds, light, or smells because of a headache.  You have nausea or you vomit.  Your stomach hurts. Get help right away if:  You suddenly develop a very severe headache along with any of the following: ? A stiff neck. ? Nausea and vomiting. ? Confusion. ? Weakness. ? Double vision or loss of vision. ? Shortness of breath. ? Rash. ? Unusual sleepiness. ? Fever. ? Trouble speaking. ? Pain in your eyes or ears. ? Trouble walking or balancing. ? Feeling faint or passing out. Summary  A tension headache is a feeling of pain, pressure, or aching in the head that is often felt over the front and sides of the head.  A tension headache can last from 30 minutes to several days. It is the most common kind of headache.  This condition may be diagnosed based on your symptoms, your medical history, and a physical exam.  This condition may be treated with lifestyle changes and with medicines that help relieve symptoms. This information is not intended to replace advice given to you by your health care provider. Make sure you discuss any questions you have with your health care provider. Document Revised: 07/01/2019 Document Reviewed: 12/14/2016 Elsevier Patient Education  [place].

## 2020-08-09 ENCOUNTER — Encounter: Payer: Self-pay | Admitting: Family Medicine

## 2020-12-26 ENCOUNTER — Ambulatory Visit
Admission: RE | Admit: 2020-12-26 | Discharge: 2020-12-26 | Disposition: A | Discharge status: Home / Self Care | Payer: BC Managed Care – PPO | Source: Ambulatory Visit | Attending: Obstetrics and Gynecology | Admitting: Obstetrics and Gynecology

## 2020-12-26 ENCOUNTER — Other Ambulatory Visit: Payer: Self-pay | Admitting: Obstetrics and Gynecology

## 2020-12-26 ENCOUNTER — Other Ambulatory Visit: Payer: Self-pay

## 2020-12-26 ENCOUNTER — Ambulatory Visit
Admission: RE | Admit: 2020-12-26 | Discharge: 2020-12-26 | Disposition: A | Discharge status: Home / Self Care | Payer: No Typology Code available for payment source | Source: Ambulatory Visit | Attending: Obstetrics and Gynecology | Admitting: Obstetrics and Gynecology

## 2020-12-26 DIAGNOSIS — N6320 Unspecified lump in the left breast, unspecified quadrant: Secondary | ICD-10-CM

## 2021-02-07 ENCOUNTER — Other Ambulatory Visit: Payer: Self-pay

## 2021-02-08 ENCOUNTER — Encounter: Payer: Self-pay | Admitting: Family Medicine

## 2021-02-08 ENCOUNTER — Ambulatory Visit (INDEPENDENT_AMBULATORY_CARE_PROVIDER_SITE_OTHER): Payer: BC Managed Care – PPO | Admitting: Family Medicine

## 2021-02-08 VITALS — BP 130/70 | HR 89 | Temp 98.0°F | Ht 64.0 in | Wt 146.4 lb

## 2021-02-08 DIAGNOSIS — Z9884 Bariatric surgery status: Secondary | ICD-10-CM | POA: Diagnosis not present

## 2021-02-08 DIAGNOSIS — Z0000 Encounter for general adult medical examination without abnormal findings: Secondary | ICD-10-CM | POA: Diagnosis not present

## 2021-02-08 DIAGNOSIS — E785 Hyperlipidemia, unspecified: Secondary | ICD-10-CM

## 2021-02-08 LAB — HEPATIC FUNCTION PANEL
ALT: 20 U/L (ref 0–35)
AST: 22 U/L (ref 0–37)
Albumin: 4.4 g/dL (ref 3.5–5.2)
Alkaline Phosphatase: 68 U/L (ref 39–117)
Bilirubin, Direct: 0.1 mg/dL (ref 0.0–0.3)
Total Bilirubin: 0.4 mg/dL (ref 0.2–1.2)
Total Protein: 6.8 g/dL (ref 6.0–8.3)

## 2021-02-08 LAB — BASIC METABOLIC PANEL
BUN: 17 mg/dL (ref 6–23)
CO2: 30 mEq/L (ref 19–32)
Calcium: 9.6 mg/dL (ref 8.4–10.5)
Chloride: 103 mEq/L (ref 96–112)
Creatinine, Ser: 0.77 mg/dL (ref 0.40–1.20)
GFR: 93.81 mL/min (ref >60.00)
Glucose, Bld: 98 mg/dL (ref 70–99)
Potassium: 4.9 mEq/L (ref 3.5–5.1)
Sodium: 140 mEq/L (ref 135–145)

## 2021-02-08 LAB — CBC WITH DIFFERENTIAL/PLATELET
Basophils Absolute: 0.1 10*3/uL (ref 0.0–0.1)
Basophils Relative: 1.2 % (ref 0.0–3.0)
Eosinophils Absolute: 0.5 10*3/uL (ref 0.0–0.7)
Eosinophils Relative: 8.7 % — ABNORMAL HIGH (ref 0.0–5.0)
HCT: 35.6 % — ABNORMAL LOW (ref 36.0–46.0)
Hemoglobin: 11.6 g/dL — ABNORMAL LOW (ref 12.0–15.0)
Lymphocytes Relative: 43.9 % (ref 12.0–46.0)
Lymphs Abs: 2.4 10*3/uL (ref 0.7–4.0)
MCHC: 32.5 g/dL (ref 30.0–36.0)
MCV: 80.8 fl (ref 78.0–100.0)
Monocytes Absolute: 0.5 10*3/uL (ref 0.1–1.0)
Monocytes Relative: 8.7 % (ref 3.0–12.0)
Neutro Abs: 2.1 10*3/uL (ref 1.4–7.7)
Neutrophils Relative %: 37.5 % — ABNORMAL LOW (ref 43.0–77.0)
Platelets: 518 10*3/uL — ABNORMAL HIGH (ref 150.0–400.0)
RBC: 4.41 Mil/uL (ref 3.87–5.11)
RDW: 15.9 % — ABNORMAL HIGH (ref 11.5–15.5)
WBC: 5.6 10*3/uL (ref 4.0–10.5)

## 2021-02-08 LAB — TSH: TSH: 1.48 u[IU]/mL (ref 0.35–4.50)

## 2021-02-08 LAB — VITAMIN D 25 HYDROXY (VIT D DEFICIENCY, FRACTURES): VITD: 38.43 ng/mL (ref 30.00–100.00)

## 2021-02-08 LAB — LIPID PANEL
Cholesterol: 173 mg/dL (ref 0–200)
HDL: 86.2 mg/dL (ref >39.00)
LDL Cholesterol: 78 mg/dL (ref 0–99)
NonHDL: 86.43
Total CHOL/HDL Ratio: 2
Triglycerides: 43 mg/dL (ref 0.0–149.0)
VLDL: 8.6 mg/dL (ref 0.0–40.0)

## 2021-02-08 LAB — VITAMIN B12: Vitamin B-12: >1550 pg/mL — ABNORMAL HIGH (ref 211–911)

## 2021-02-08 NOTE — Progress Notes (Signed)
Established Patient Office Visit  Subjective:  Patient ID: Catherine Washington, female    DOB: 02-09-1976  Age: 45 y.o. MRN: [id number]  CC:  Chief Complaint  Patient presents with  . Annual Exam    No new concerns    HPI Catherine Washington presents for annual wellness visit.  She sees gynecologist yearly.  Her Pap smears and mammograms are up-to-date.  She has been getting Pap smears yearly.  She had gastric bypass surgery about 6 years ago.  She has done well maintaining her weight.  She has had previous history of gallstones and has had previous cholecystectomy.  She has history of dyslipidemia.  Strong family history of type 2 diabetes as below.  History of recurrent depression. Health maintenance reviewed:  -Pap smears and mammogram up-to-date -She gets yearly flu vaccines -Due for tetanus booster next year.  Family history-both parents have had type 2 diabetes.  Both parents have hyperlipidemia history.  Father had hypertension history.  Father died of MI at age 57.  Mother is alive.  She has had paternal grandmother with ovarian cancer and an aunt with breast cancer recurrence x2  Social history-she is married.  She has 2 children.  Never smoked.  No alcohol use.  She is an Therapist, sports.  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 Allergy   . Chicken pox   . Depression   . Family history of adverse reaction to anesthesia    mother gets very nausaus  . Headache    occasionally  . Hyperlipidemia   . UTI (urinary tract infection)     Past Surgical History:  Procedure Laterality Date  . ABDOMINAL HYSTERECTOMY  2006   dysfunctional bleeding  . BREATH TEK H PYLORI N/A 12/06/2014   Procedure: BREATH TEK H PYLORI;  Surgeon: Greer Pickerel, MD;  Location: [facility];  Service: General;  Laterality: N/A;  . childbirth  1995, 2003  .  CHOLECYSTECTOMY    . 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   Procedure: LAPAROSCOPIC ROUX-EN-Y GASTRIC BYPASS WITH UPPER ENDOSCOPY;  Surgeon: Greer Pickerel, MD;  Location: [facility];  Service: General;  Laterality: N/A;    Family History  Problem Relation Age of Onset  . Hyperlipidemia Mother   . Mental illness Mother   . Diabetes Mother        type 2 diabetes  . Hyperlipidemia Father   . Heart disease Father 30       MI  . Hypertension Father   . Diabetes Father   . Cancer Maternal Aunt        breast  . Breast cancer Maternal Aunt        40's & 50's  . Diabetes Maternal Grandmother   . Cancer Paternal Grandmother        ovarian, lung  . Stroke Paternal Grandmother   . Diabetes Paternal Grandfather     Social History   Socioeconomic History  . Marital status: Married    Spouse name: Not on file  . Number of children: Not on file  . Years of education: Not on file  . Highest education level: Not on file  Occupational History  . Not on file  Tobacco Use  . Smoking status: Never Smoker  . Smokeless tobacco: Never Used  Substance and Sexual Activity  . Alcohol use:  No  . Drug use: No  . Sexual activity: Not on file  Other Topics Concern  . Not on file  Social History Narrative   Has 2 children  10 and 15 non smoker    Social Determinants of Radio broadcast assistant Strain: Not on file  Food Insecurity: Not on file  Transportation Needs: Not on file  Physical Activity: Not on file  Stress: Not on file  Social Connections: Not on file  Intimate Partner Violence: Not on file    Outpatient Medications Prior to Visit  Medication Sig Dispense Refill  . buPROPion (WELLBUTRIN) 100 MG tablet Take 1 tablet (100 mg total) by mouth 3 (three) times daily. 270 tablet 3  . Multiple Vitamin (MULTIVITAMIN) tablet Take 1 tablet by mouth daily.    Marland Kitchen venlafaxine XR (EFFEXOR-XR) 37.5 MG 24 hr capsule Take 1 capsule (37.5 mg total) by mouth daily with breakfast. 90 capsule 3  . vitamin  B-12 (CYANOCOBALAMIN) 500 MCG tablet Take 500 mcg by mouth daily.    Marland Kitchen CALCIUM CITRATE PO Take by mouth. Takes 500mg  tablet TID.    Marland Kitchen cyclobenzaprine (FLEXERIL) 5 MG tablet Take one to two at night by mouth as needed for tension headache 30 tablet 0   No facility-administered medications prior to visit.    Allergies  Allergen Reactions  . Adhesive [Tape] Other (See Comments)    Blisters  . Nitrofurantoin Monohyd Macro     GI upset    ROS Review of Systems  Constitutional: Negative for activity change, appetite change, fatigue, fever and unexpected weight change.  HENT: Negative for ear pain, hearing loss, sore throat and trouble swallowing.   Eyes: Negative for visual disturbance.  Respiratory: Negative for cough and shortness of breath.   Cardiovascular: Negative for chest pain and palpitations.  Gastrointestinal: Positive for constipation. Negative for abdominal pain, blood in stool and diarrhea.  Genitourinary: Negative for dysuria and hematuria.  Musculoskeletal: Negative for arthralgias, back pain and myalgias.  Skin: Negative for rash.  Neurological: Negative for dizziness, syncope and headaches.  Hematological: Negative for adenopathy.  Psychiatric/Behavioral: Negative for confusion and dysphoric mood.      Objective:    Physical Exam Constitutional:      Appearance: She is well-developed.  HENT:     Head: Normocephalic and atraumatic.  Neck:     Thyroid: No thyromegaly.  Cardiovascular:     Rate and Rhythm: Normal rate and regular rhythm.     Heart sounds: Normal heart sounds. No murmur heard.   Pulmonary:     Effort: No respiratory distress.     Breath sounds: Normal breath sounds. No wheezing or rales.  Abdominal:     General: Bowel sounds are normal. There is no distension.     Palpations: Abdomen is soft. There is no mass.     Tenderness: There is no abdominal tenderness. There is no guarding or rebound.  Musculoskeletal:        General: Normal range  of motion.     Cervical back: Normal range of motion and neck supple.  Lymphadenopathy:     Cervical: No cervical adenopathy.  Skin:    Findings: No rash.  Neurological:     Mental Status: She is alert and oriented to person, place, and time.     Cranial Nerves: No cranial nerve deficit.  Psychiatric:        Behavior: Behavior normal.        Thought Content: Thought content normal.  Judgment: Judgment normal.     BP 130/70 (BP Location: Left Arm, Patient Position: Sitting, Cuff Size: Normal)   Pulse 89   Temp 98 F (36.7 C) (Oral)   Ht 5\' 4"  (1.626 m)   Wt 146 lb 6.4 oz (66.4 kg)   SpO2 98%   BMI 25.13 kg/m  Wt Readings from Last 3 Encounters:  02/08/21 146 lb 6.4 oz (66.4 kg)  08/03/20 146 lb (66.2 kg)  02/24/15 239 lb (108.4 kg)     Health Maintenance Due  Topic Date Due  . HIV Screening  Never done    There are no preventive care reminders to display for this patient.  Lab Results  Component Value Date   TSH 2.71 10/05/2014   Lab Results  Component Value Date   WBC 9.0 03/09/2015   HGB 10.7 (L) 03/09/2015   HCT 33.3 (L) 03/09/2015   MCV 86.5 03/09/2015   PLT 365 03/09/2015   Lab Results  Component Value Date   NA 136 03/08/2015   K 3.8 03/08/2015   CO2 26 03/08/2015   GLUCOSE 125 (H) 03/08/2015   BUN 6 03/08/2015   CREATININE 0.85 03/08/2015   BILITOT 0.5 03/08/2015   ALKPHOS 52 03/08/2015   AST 25 03/08/2015   ALT 24 03/08/2015   PROT 6.7 03/08/2015   ALBUMIN 3.8 03/08/2015   CALCIUM 8.7 (L) 03/08/2015   ANIONGAP 6 03/08/2015   GFR 84.07 10/05/2014   Lab Results  Component Value Date   CHOL 189 10/05/2014   Lab Results  Component Value Date   HDL 44.10 10/05/2014   Lab Results  Component Value Date   LDLCALC 106 (H) 10/05/2014   Lab Results  Component Value Date   TRIG 193.0 (H) 10/05/2014   Lab Results  Component Value Date   CHOLHDL 4 10/05/2014   No results found for: HGBA1C    Assessment & Plan:   Problem List  Items Addressed This Visit   None   Visit Diagnoses    Physical exam    -  Primary   Relevant Orders   Basic metabolic panel   Lipid panel   CBC with Differential/Platelet   TSH   Hepatic function panel   Vitamin B12   VITAMIN D 25 Hydroxy (Vit-D Deficiency, Fractures)   History of gastric bypass       Relevant Orders   Vitamin B12   VITAMIN D 25 Hydroxy (Vit-D Deficiency, Fractures)     -Obtain screening labs as above -Continue regular weightbearing exercise -Tetanus booster next year -Discussed colon cancer screening by next year at age 70 -Discussed coronary calcium score for risk stratification with her positive family history and she declines at this time -She plans to continue GYN follow-up for Pap smears and mammograms  No orders of the defined types were placed in this encounter.   Follow-up: No follow-ups on file.    Carolann Littler, MD

## 2021-02-08 NOTE — Patient Instructions (Signed)
Preventive Care 84-45 Years Old, Female Preventive care refers to lifestyle choices and visits with your health care provider that can promote health and wellness. This includes:  A yearly physical exam. This is also called an annual wellness visit.  Regular dental and eye exams.  Immunizations.  Screening for certain conditions.  Healthy lifestyle choices, such as: ? Eating a healthy diet. ? Getting regular exercise. ? Not using drugs or products that contain nicotine and tobacco. ? Limiting alcohol use. What can I expect for my preventive care visit? Physical exam Your health care provider will check your:  Height and weight. These may be used to calculate your BMI (body mass index). BMI is a measurement that tells if you are at a healthy weight.  Heart rate and blood pressure.  Body temperature.  Skin for abnormal spots. Counseling Your health care provider may ask you questions about your:  Past medical problems.  Family's medical history.  Alcohol, tobacco, and drug use.  Emotional well-being.  Home life and relationship well-being.  Sexual activity.  Diet, exercise, and sleep habits.  Work and work Statistician.  Access to firearms.  Method of birth control.  Menstrual cycle.  Pregnancy history. What immunizations do I need? Vaccines are usually given at various ages, according to a schedule. Your health care provider will recommend vaccines for you based on your age, medical history, and lifestyle or other factors, such as travel or where you work.   What tests do I need? Blood tests  Lipid and cholesterol levels. These may be checked every 5 years, or more often if you are over 3 years old.  Hepatitis C test.  Hepatitis B test. Screening  Lung cancer screening. You may have this screening every year starting at age 73 if you have a 30-pack-year history of smoking and currently smoke or have quit within the past 15 years.  Colorectal cancer  screening. ? All adults should have this screening starting at age 52 and continuing until age 17. ? Your health care provider may recommend screening at age 49 if you are at increased risk. ? You will have tests every 1-10 years, depending on your results and the type of screening test.  Diabetes screening. ? This is done by checking your blood sugar (glucose) after you have not eaten for a while (fasting). ? You may have this done every 1-3 years.  Mammogram. ? This may be done every 1-2 years. ? Talk with your health care provider about when you should start having regular mammograms. This may depend on whether you have a family history of breast cancer.  BRCA-related cancer screening. This may be done if you have a family history of breast, ovarian, tubal, or peritoneal cancers.  Pelvic exam and Pap test. ? This may be done every 3 years starting at age 10. ? Starting at age 11, this may be done every 5 years if you have a Pap test in combination with an HPV test. Other tests  STD (sexually transmitted disease) testing, if you are at risk.  Bone density scan. This is done to screen for osteoporosis. You may have this scan if you are at high risk for osteoporosis. Talk with your health care provider about your test results, treatment options, and if necessary, the need for more tests. Follow these instructions at home: Eating and drinking  Eat a diet that includes fresh fruits and vegetables, whole grains, lean protein, and low-fat dairy products.  Take vitamin and mineral supplements  as recommended by your health care provider.  Do not drink alcohol if: ? Your health care provider tells you not to drink. ? You are pregnant, may be pregnant, or are planning to become pregnant.  If you drink alcohol: ? Limit how much you have to 0-1 drink a day. ? Be aware of how much alcohol is in your drink. In the [place] one drink equals one 12 oz bottle of beer (355 mL), one 5 oz glass of  wine (148 mL), or one 1 oz glass of hard liquor (44 mL).   Lifestyle  Take daily care of your teeth and gums. Brush your teeth every morning and night with fluoride toothpaste. Floss one time each day.  Stay active. Exercise for at least 30 minutes 5 or more days each week.  Do not use any products that contain nicotine or tobacco, such as cigarettes, e-cigarettes, and chewing tobacco. If you need help quitting, ask your health care provider.  Do not use drugs.  If you are sexually active, practice safe sex. Use a condom or other form of protection to prevent STIs (sexually transmitted infections).  If you do not wish to become pregnant, use a form of birth control. If you plan to become pregnant, see your health care provider for a prepregnancy visit.  If told by your health care provider, take low-dose aspirin daily starting at age 62.  Find healthy ways to cope with stress, such as: ? Meditation, yoga, or listening to music. ? Journaling. ? Talking to a trusted person. ? Spending time with friends and family. Safety  Always wear your seat belt while driving or riding in a vehicle.  Do not drive: ? If you have been drinking alcohol. Do not ride with someone who has been drinking. ? When you are tired or distracted. ? While texting.  Wear a helmet and other protective equipment during sports activities.  If you have firearms in your house, make sure you follow all gun safety procedures. What's next?  Visit your health care provider once a year for an annual wellness visit.  Ask your health care provider how often you should have your eyes and teeth checked.  Stay up to date on all vaccines. This information is not intended to replace advice given to you by your health care provider. Make sure you discuss any questions you have with your health care provider. Document Revised: 06/07/2020 Document Reviewed: 05/15/2018 Elsevier Patient Education  2021 [place]. Coronary Calcium Scan A coronary calcium scan is an imaging test used to look for deposits of plaque in the inner lining of the blood vessels of the heart (coronary arteries). Plaque is made up of calcium, protein, and fatty substances. These deposits of plaque can partly clog and narrow the coronary arteries without producing any symptoms or warning signs. This puts a person at risk for a heart attack. This test is recommended for people who are at moderate risk for heart disease. The test can find plaque deposits before symptoms develop. Tell a health care provider about:  Any allergies you have.  All medicines you are taking, including vitamins, herbs, eye drops, creams, and over-the-counter medicines.  Any problems you or family members have had with anesthetic medicines.  Any blood disorders you have.  Any surgeries you have had.  Any medical conditions you have.  Whether you are pregnant or may be pregnant. What are the risks? Generally, this is a safe procedure. However, problems may occur, including:  Harm to a pregnant woman and her unborn baby. This test involves the use of radiation. Radiation exposure can be dangerous to a pregnant woman and her unborn baby. If you are pregnant or think you may be pregnant, you should not have this procedure done.  Slight increase in the risk of cancer. This is because of the radiation involved in the test. What happens before the procedure? Ask your health care provider for any specific instructions on how to prepare for this procedure. You may be asked to avoid products that contain caffeine, tobacco, or nicotine for 4 hours before the procedure. What happens during the procedure?  You will undress and remove any jewelry from your neck or chest.  You will put on a hospital gown.  Sticky electrodes will be placed on your chest. The electrodes will be connected to an electrocardiogram (ECG) machine to record a tracing of the  electrical activity of your heart.  You will lie down on a curved bed that is attached to the [place].  You may be given medicine to slow down your heart rate so that clear pictures can be created.  You will be moved into the CT scanner, and the CT scanner will take pictures of your heart. During this time, you will be asked to lie still and hold your breath for 2-3 seconds at a time while each picture of your heart is being taken. The procedure may vary among health care providers and hospitals.   What happens after the procedure?  You can get dressed.  You can return to your normal activities.  It is up to you to get the results of your procedure. Ask your health care provider, or the department that is doing the procedure, when your results will be ready. Summary  A coronary calcium scan is an imaging test used to look for deposits of plaque in the inner lining of the blood vessels of the heart (coronary arteries). Plaque is made up of calcium, protein, and fatty substances.  Generally, this is a safe procedure. Tell your health care provider if you are pregnant or may be pregnant.  Ask your health care provider for any specific instructions on how to prepare for this procedure.  A CT scanner will take pictures of your heart.  You can return to your normal activities after the scan is done. This information is not intended to replace advice given to you by your health care provider. Make sure you discuss any questions you have with your health care provider. Document Revised: 03/24/2019 Document Reviewed: 03/24/2019 Elsevier Patient Education  [place].

## 2021-02-09 ENCOUNTER — Encounter: Payer: Self-pay | Admitting: Family Medicine

## 2021-02-09 DIAGNOSIS — Z803 Family history of malignant neoplasm of breast: Secondary | ICD-10-CM

## 2021-02-09 DIAGNOSIS — Z8041 Family history of malignant neoplasm of ovary: Secondary | ICD-10-CM

## 2021-02-10 ENCOUNTER — Telehealth: Payer: Self-pay | Admitting: Licensed Clinical Social Worker

## 2021-02-10 NOTE — Telephone Encounter (Signed)
Scheduled per referral. Called and spoke with pt confirmed 6/13 appt. Pt made aware to arrive 15-63mins before appt and to also bring photo ID and insurance card

## 2021-02-27 ENCOUNTER — Other Ambulatory Visit: Payer: Self-pay

## 2021-02-27 ENCOUNTER — Inpatient Hospital Stay: Payer: BC Managed Care – PPO

## 2021-02-27 ENCOUNTER — Encounter: Payer: Self-pay | Admitting: Licensed Clinical Social Worker

## 2021-02-27 ENCOUNTER — Inpatient Hospital Stay: Payer: BC Managed Care – PPO | Attending: Genetic Counselor | Admitting: Licensed Clinical Social Worker

## 2021-02-27 DIAGNOSIS — Z808 Family history of malignant neoplasm of other organs or systems: Secondary | ICD-10-CM | POA: Diagnosis not present

## 2021-02-27 DIAGNOSIS — Z803 Family history of malignant neoplasm of breast: Secondary | ICD-10-CM

## 2021-02-27 DIAGNOSIS — Z8051 Family history of malignant neoplasm of kidney: Secondary | ICD-10-CM

## 2021-02-27 DIAGNOSIS — Z8041 Family history of malignant neoplasm of ovary: Secondary | ICD-10-CM

## 2021-02-27 LAB — GENETIC SCREENING ORDER

## 2021-02-27 NOTE — Progress Notes (Signed)
REFERRING PROVIDER: Eulas Post, MD [place],  [place] [address]  PRIMARY PROVIDER:  Eulas Post, MD  PRIMARY REASON FOR VISIT:  1. Family history of breast cancer   2. Family history of ovarian cancer   3. Family history of kidney cancer   4. Family history of thyroid cancer   5. Family history of melanoma      HISTORY OF PRESENT ILLNESS:   Ms. Camerer, a 45 y.o. female, was seen for a [place] cancer genetics consultation at the request of Dr. Elease Hashimoto due to a family history of cancer.  Ms. Lutes presents to clinic today to discuss the possibility of a hereditary predisposition to cancer, genetic testing, and to further clarify her future cancer risks, as well as potential cancer risks for family members.   Ms. Wigington is a 45 y.o. female with no personal history of cancer.    CANCER HISTORY:  Oncology History   No history exists.     RISK FACTORS:  Menarche was at age 81.  First live birth at age 53.  OCP use for approximately 6 years.  Ovaries intact: yes.  Hysterectomy: yes.  HRT use: 0 years. Colonoscopy: no; not examined. Mammogram within the last year: yes- being followed every 6 mos for spot Number of breast biopsies: 0. Up to date with pelvic exams: yes.  Past Medical History:  Diagnosis Date   Allergy    Chicken pox    Depression    Family history of adverse reaction to anesthesia    mother gets very nausaus   Family history of breast cancer    Family history of breast cancer    Family history of kidney cancer    Family history of melanoma    Family history of ovarian cancer    Family history of thyroid cancer    Headache    occasionally   Hyperlipidemia    UTI (urinary tract infection)     Past Surgical History:  Procedure Laterality Date   ABDOMINAL HYSTERECTOMY  2006   dysfunctional bleeding   BREATH TEK H PYLORI N/A 12/06/2014   Procedure: BREATH TEK H PYLORI;  Surgeon: Greer Pickerel, MD;  Location: [facility];  Service: General;  Laterality: N/A;   childbirth  1995, 2003   CHOLECYSTECTOMY     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   Procedure: LAPAROSCOPIC ROUX-EN-Y GASTRIC BYPASS WITH UPPER ENDOSCOPY;  Surgeon: Greer Pickerel, MD;  Location: [facility];  Service: General;  Laterality: N/A;    Social History   Socioeconomic History   Marital status: Married    Spouse name: Not on file   Number of children: Not on file   Years of education: Not on file   Highest education level: Not on file  Occupational History   Not on file  Tobacco Use   Smoking status: Never   Smokeless tobacco: Never  Substance and Sexual Activity   Alcohol use: No   Drug use: No   Sexual activity: Not on file  Other Topics Concern   Not on file  Social History Narrative   Has 2 children  10 and 15 non smoker    Social Determinants of Health   Financial Resource Strain: Not on file  Food Insecurity: Not on file  Transportation Needs: Not on file  Physical Activity: Not on file  Stress: Not on file  Social Connections: Not on file     FAMILY HISTORY:  We obtained a detailed, 4-generation family history.  Significant diagnoses are listed below: Family History  Problem Relation Age of Onset   Hyperlipidemia Mother    Mental illness Mother    Diabetes Mother        type 2 diabetes   Hyperlipidemia Father    Heart disease Father 59       MI   Hypertension Father    Diabetes Father    Thyroid cancer Father 58   Melanoma Father    Breast cancer Maternal Aunt        diagonsed x2, first in 69s   Cancer Maternal Aunt 64       metastatic; lung, liver, panc, spleen   Diabetes Maternal Grandmother    Kidney cancer Maternal Grandmother    Melanoma Maternal Grandmother    Ovarian cancer Paternal Grandmother        dx late 66s   Stroke Paternal Grandmother    Lung cancer Paternal Grandmother        dx 12s, hx smoking   Diabetes Paternal Grandfather    Ms. Boothe has 1 son and 1 daughter, her daughter  was assigned female at birth, no cancers. No siblings.  Ms. Perritt mother is living at 56, no cancer history. Patient had 4 maternal aunts. One had breast cancer in her 24s and diagnosed again later in life, she is living in her 58s. Another aunt had metastatic cancer to liver, lungs, pancreas and spleen, unknown primary and passed at 80. No known cancers for cousins. Maternal grandmother had melanoma and kidney cancer diagnosed in her 61s and died of the kidney cancer in her 94s. Grandfather passed at 7.  Ms. Bango father had thyroid cancer at 38, unknown type, as well as melanoma and another skin cancer. He did have sun exposure due to golfing. Patient has 1 paternal aunt, no cancers for her or for patient's cousins. Paternal grandmother had lung cancer in her 49s, history of smoking, and ovarian cancer in her late 66s and she passed at 21. Paternal grandfather passed at 85.   Ms. Houchin is unaware of previous family history of genetic testing for hereditary cancer risks. Patient's maternal ancestors are of [place], [place], [place] descent, and paternal ancestors are of [place], [place] descent. There is no reported Ashkenazi Jewish ancestry. There is no known consanguinity.    GENETIC COUNSELING ASSESSMENT: Ms. Crossman is a 44 y.o. female with a family history  which is somewhat suggestive of a hereditary cancer syndrome and predisposition to cancer. We, therefore, discussed and recommended the following at today's visit.   DISCUSSION: We discussed that approximately 5-10% of cancer in general is hereditary. Most cases of hereditary breast and ovarian cancer are associated with BRCA1/BRCA2 genes, although there are other genes associated with hereditary cancer as well including genes associated with thyroid, kidney and melanoma. Cancers and risks are gene specific.  We discussed that testing is beneficial for several reasons including knowing about other cancer risks, identifying potential  screening and risk-reduction options that may be appropriate, and to understand if other family members could be at risk for cancer and allow them to undergo genetic testing.   We reviewed the characteristics, features and inheritance patterns of hereditary cancer syndromes. We also discussed genetic testing, including the appropriate family members to test, the process of testing, insurance coverage and turn-around-time for results. We discussed the implications of a negative, positive and/or variant of uncertain significant result. We recommended Ms. Catheryn Bacon pursue genetic testing for the [facility] CustomNext+RNA gene panel.   Based  on Ms. Ferrucci's family history of cancer, she meets medical criteria for genetic testing. Despite that she meets criteria, she may still have an out of pocket cost. We discussed that if her out of pocket cost for testing is over $100, the laboratory will call and confirm whether she wants to proceed with testing.  If the out of pocket cost of testing is less than $100 she will be billed by the genetic testing laboratory.   We discussed that some people do not want to undergo genetic testing due to fear of genetic discrimination.  A federal law called the Genetic Information Non-Discrimination Act (GINA) of 2008 helps protect individuals against genetic discrimination based on their genetic test results.  It impacts both health insurance and employment.  For health insurance, it protects against increased premiums, being kicked off insurance or being forced to take a test in order to be insured.  For employment it protects against hiring, firing and promoting decisions based on genetic test results.  Health status due to a cancer diagnosis is not protected under GINA.  This law does not protect life insurance, disability insurance, or other types of insurance.   PLAN: After considering the risks, benefits, and limitations, Ms. Catheryn Bacon provided informed consent to pursue genetic  testing and the blood sample was sent to [facility] for analysis of the CustomNext+RNA panel. Results should be available within approximately 2-3 weeks' time, at which point they will be disclosed by telephone to Ms. Catheryn Bacon, as will any additional recommendations warranted by these results. Ms. Milosevic will receive a summary of her genetic counseling visit and a copy of her results once available. This information will also be available in Epic.   Ms. Moye questions were answered to her satisfaction today. Our contact information was provided should additional questions or concerns arise. Thank you for the referral and allowing [place] to share in the care of your patient.   Faith Rogue, MS, [facility] Genetic Counselor [email or web address] Phone: [phone]  The patient was seen for a total of 25 minutes in face-to-face genetic counseling. Patient was seen alone.Drs. Magrinat, Lindi Adie and/or Burr Medico were available for discussion regarding this case.   _______________________________________________________________________ For Office Staff:  Number of people involved in session: 1 Was an Intern/ student involved with case: no

## 2021-03-13 ENCOUNTER — Ambulatory Visit: Payer: Self-pay | Admitting: Licensed Clinical Social Worker

## 2021-03-13 ENCOUNTER — Encounter: Payer: Self-pay | Admitting: Licensed Clinical Social Worker

## 2021-03-13 ENCOUNTER — Telehealth: Payer: Self-pay | Admitting: Licensed Clinical Social Worker

## 2021-03-13 DIAGNOSIS — Z8051 Family history of malignant neoplasm of kidney: Secondary | ICD-10-CM

## 2021-03-13 DIAGNOSIS — Z1379 Encounter for other screening for genetic and chromosomal anomalies: Secondary | ICD-10-CM | POA: Insufficient documentation

## 2021-03-13 DIAGNOSIS — Z803 Family history of malignant neoplasm of breast: Secondary | ICD-10-CM

## 2021-03-13 DIAGNOSIS — Z808 Family history of malignant neoplasm of other organs or systems: Secondary | ICD-10-CM

## 2021-03-13 DIAGNOSIS — Z8041 Family history of malignant neoplasm of ovary: Secondary | ICD-10-CM

## 2021-03-13 NOTE — Progress Notes (Signed)
HPI:  Catherine Washington was previously seen in the [facility] clinic due to a family history of cancer and concerns regarding a hereditary predisposition to cancer. Please refer to our prior cancer genetics clinic note for more information regarding our discussion, assessment and recommendations, at the time. Catherine Washington recent genetic test results were disclosed to her, as were recommendations warranted by these results. These results and recommendations are discussed in more detail below.  CANCER HISTORY:  Oncology History   No history exists.    FAMILY HISTORY:  We obtained a detailed, 4-generation family history.  Significant diagnoses are listed below: Family History  Problem Relation Age of Onset   Hyperlipidemia Mother    Mental illness Mother    Diabetes Mother        type 2 diabetes   Hyperlipidemia Father    Heart disease Father 42       MI   Hypertension Father    Diabetes Father    Thyroid cancer Father 81   Melanoma Father    Breast cancer Maternal Aunt        diagonsed x2, first in 29s   Cancer Maternal Aunt 64       metastatic; lung, liver, panc, spleen   Diabetes Maternal Grandmother    Kidney cancer Maternal Grandmother    Melanoma Maternal Grandmother    Ovarian cancer Paternal Grandmother        dx late 41s   Stroke Paternal Grandmother    Lung cancer Paternal Grandmother        dx 32s, hx smoking   Diabetes Paternal Grandfather    Catherine Washington has 1 son and 1 daughter, her daughter was assigned female at birth, no cancers. No siblings.   Catherine Washington mother is living at 43, no cancer history. Patient had 4 maternal aunts. One had breast cancer in her 43s and diagnosed again later in life, she is living in her 42s. Another aunt had metastatic cancer to liver, lungs, pancreas and spleen, unknown primary and passed at 39. No known cancers for cousins. Maternal grandmother had melanoma and kidney cancer diagnosed in her 77s and died of the kidney cancer in  her 80s. Grandfather passed at 12.   Catherine Washington father had thyroid cancer at 67, unknown type, as well as melanoma and another skin cancer. He did have sun exposure due to golfing. Patient has 1 paternal aunt, no cancers for her or for patient's cousins. Paternal grandmother had lung cancer in her 37s, history of smoking, and ovarian cancer in her late 47s and she passed at 7. Paternal grandfather passed at 27.    Catherine Washington is unaware of previous family history of genetic testing for hereditary cancer risks. Patient's maternal ancestors are of [place], [place], [place] descent, and paternal ancestors are of [place], [place] descent. There is no reported Ashkenazi Jewish ancestry. There is no known consanguinity.      GENETIC TEST RESULTS: Genetic testing reported out on 03/08/2021 through the [facility] CustomNext-Cancer+RNA cancer panel found no pathogenic mutations.   The CancerNext-Expanded + RNAinsight gene panel offered by [facility] and includes sequencing and rearrangement analysis for the following 47 genes:  APC, ATM, AXIN2, BARD1, BMPR1A, BRCA1, BRCA2, BRIP1, CDH1, CDKN2A (p14ARF), CDKN2A (p16INK4a), CKD4, CHEK2, CTNNA1, DICER1, EPCAM (Deletion/duplication testing only), GREM1 (promoter region deletion/duplication testing only), KIT, MEN1, MLH1, MSH2, MSH3, MSH6, MUTYH, NBN, NF1, NHTL1, PALB2, PDGFRA, PMS2, POLD1, POLE, PTEN, RAD50, RAD51C, RAD51D, SDHB, SDHC, SDHD, SMAD4, SMARCA4. STK11, TP53,  TSC1, TSC2, and VHL.  The following genes were evaluated for sequence changes only: SDHA and HOXB13 c.251G>A variant only.  The test report has been scanned into EPIC and is located under the Molecular Pathology section of the Results Review tab.  A portion of the result report is included below for reference.     We discussed that because current genetic testing is not perfect, it is possible there may be a gene mutation in one of these genes that current testing cannot detect, but that  chance is small.  There could be another gene that has not yet been discovered, or that we have not yet tested, that is responsible for the cancer diagnoses in the family. It is also possible there is a hereditary cause for the cancer in the family that Catherine Washington did not inherit and therefore was not identified in her testing.  Therefore, it is important to remain in touch with cancer genetics in the future so that we can continue to offer Catherine Washington the most up to date genetic testing.   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  At this time, it is unknown if this variant is associated with increased cancer risk or if this is a normal finding, but most variants such as this get reclassified to being inconsequential. It should not be used to make medical management decisions. With time, we suspect the lab will determine the significance of this variant, if any. If we do learn more about it we will try to contact Catherine Washington to discuss it further. However, it is important to stay in touch with [place] periodically and keep the address and phone number up to date.  ADDITIONAL GENETIC TESTING: We discussed with Catherine Washington that her genetic testing was fairly extensive.  If there are genes identified to increase cancer risk that can be analyzed in the future, we would be happy to discuss and coordinate this testing at that time.    CANCER SCREENING RECOMMENDATIONS: Catherine Washington test result is considered negative (normal).  This means that we have not identified a hereditary cause for her family history of cancer at this time.   While reassuring, this does not definitively rule out a hereditary predisposition to cancer. It is still possible that there could be genetic mutations that are undetectable by current technology. There could be genetic mutations in genes that have not been tested or identified to increase cancer risk.  Therefore, it is  recommended she continue to follow the cancer management and screening guidelines provided by her primary healthcare provider.   An individual's cancer risk and medical management are not determined by genetic test results alone. Overall cancer risk assessment incorporates additional factors, including personal medical history, family history, and any available genetic information that may result in a personalized plan for cancer prevention and surveillance.  Based on Catherine Washington's personal and family history of cancer as well as her genetic test results, risk model Harriett Rush was used to estimate her risk of developing breast cancer. This estimates her lifetime risk of developing breast cancer to be approximately 15%.  The patient's lifetime breast cancer risk is a preliminary estimate based on available information using one of several models endorsed by the [place] (ACS). The ACS recommends consideration of breast MRI screening as an adjunct to mammography for patients at high risk (defined as 20% or greater lifetime risk).     RECOMMENDATIONS FOR FAMILY  MEMBERS:  Relatives in this family might be at some increased risk of developing cancer, over the general population risk, simply due to the family history of cancer.  We recommended female relatives in this family have a yearly mammogram beginning at age 67, or 75 years younger than the earliest onset of cancer, an annual clinical breast exam, and perform monthly breast self-exams. Female relatives in this family should also have a gynecological exam as recommended by their primary provider.  All family members should be referred for colonoscopy starting at age 51.    It is also possible there is a hereditary cause for the cancer in Catherine Washington family that she did not inherit and therefore was not identified in her.  Based on Catherine Washington's family history, we recommended maternal and paternal relatives, especially those who have had  cancer, have genetic counseling and testing. Catherine Washington will let us know if we can be of any assistance in coordinating genetic counseling and/or testing for these family members.  FOLLOW-UP: Lastly, we discussed with Catherine Washington that cancer genetics is a rapidly advancing field and it is possible that new genetic tests will be appropriate for her and/or her family members in the future. We encouraged her to remain in contact with cancer genetics on an annual basis so we can update her personal and family histories and let her know of advances in cancer genetics that may benefit this family.   Our contact number was provided. Catherine Washington. [place] questions were answered to her satisfaction, and she knows she is welcome to call us at anytime with additional questions or concerns.   Faith Rogue, Catherine Washington, [facility] [place] Genetic Counselor [email or web address] Phone: [phone]

## 2021-03-13 NOTE — Telephone Encounter (Signed)
Revealed negative genetic testing.  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 This normal result is reassuring. It is unlikely that there is an increased risk of cancer due to a mutation in one of these genes.  However, genetic testing is not perfect, and cannot definitively rule out a hereditary cause.  It will be important for her to keep in contact with genetics to learn if any additional testing may be needed in the future.

## 2021-03-21 ENCOUNTER — Other Ambulatory Visit: Payer: Self-pay

## 2021-03-21 ENCOUNTER — Ambulatory Visit (INDEPENDENT_AMBULATORY_CARE_PROVIDER_SITE_OTHER)
Admission: RE | Admit: 2021-03-21 | Discharge: 2021-03-21 | Disposition: A | Discharge status: Home / Self Care | Payer: Self-pay | Source: Ambulatory Visit | Attending: Family Medicine | Admitting: Family Medicine

## 2021-03-21 DIAGNOSIS — Z0000 Encounter for general adult medical examination without abnormal findings: Secondary | ICD-10-CM

## 2021-03-22 ENCOUNTER — Other Ambulatory Visit: Payer: Self-pay | Admitting: Family Medicine

## 2021-03-22 DIAGNOSIS — F339 Major depressive disorder, recurrent, unspecified: Secondary | ICD-10-CM

## 2021-05-06 ENCOUNTER — Telehealth: Payer: BC Managed Care – PPO | Admitting: Physician Assistant

## 2021-05-06 ENCOUNTER — Encounter: Payer: Self-pay | Admitting: Physician Assistant

## 2021-05-06 DIAGNOSIS — Z20822 Contact with and (suspected) exposure to covid-19: Secondary | ICD-10-CM

## 2021-05-06 DIAGNOSIS — J029 Acute pharyngitis, unspecified: Secondary | ICD-10-CM | POA: Diagnosis not present

## 2021-05-06 NOTE — Progress Notes (Signed)
E-Visit for Corona Virus Screening  Your current symptoms could be consistent with the coronavirus.  Many health care providers can now test patients at their office but not all are.  [place] has multiple testing sites. For information on our [place] testing locations and hours go to [email or web address]  We are enrolling you in our [place] for [place] . Daily you will receive a questionnaire within the [place] website. Our COVID 19 response team will be monitoring your responses daily.  Testing Information: The COVID-19 Community Testing sites are testing BY APPOINTMENT ONLY.  You can schedule online at [email or web address]  If you do not have access to a smart phone or computer you may call [phone] for an appointment.   Additional testing sites in the Community:  For [facility] Testing sites in [place]  [email or web address]  For Pop-up testing sites in [place]  [email or web address]  For [facility] [email or web address]  For [facility] testing in [place] and [employer] [email or web address]  For [facility] testing in [place]   [email or web address]  For  more information about community testing call [phone]   Please quarantine yourself while awaiting your test results. Please stay home for a minimum of 10 days from the first day of illness with improving symptoms and you have had 24 hours of no fever (without the use of Tylenol (Acetaminophen) Motrin (Ibuprofen) or any fever reducing medication).  Also - Do not get tested prior to returning to work because once you have had a positive test the test can stay positive for  more than a month in some cases.   You should wear a mask or cloth face covering over your nose and mouth if you must be around other people or animals, including pets (even at home). Try to stay at least 6 feet away from other people. This will protect the people around you.  Please continue good preventive care measures, including:  frequent hand-washing, avoid touching your face, cover coughs/sneezes, stay out of crowds and keep a 6 foot distance from others.  COVID-19 is a respiratory illness with symptoms that are similar to the flu. Symptoms are typically mild to moderate, but there have been cases of severe illness and death due to the virus.   The following symptoms may appear 2-14 days after exposure: Fever Cough Shortness of breath or difficulty breathing Chills Repeated shaking with chills Muscle pain Headache Sore throat New loss of taste or smell Fatigue Congestion or runny nose Nausea or vomiting Diarrhea  Go to the nearest hospital ED for assessment if fever/cough/breathlessness are severe or illness seems like a threat to life.  It is vitally important that if you feel that you have an infection such as this virus or any other virus that you stay home and away from places where you may spread it to others.  You should avoid contact with people age 20 and older.   Catherine Washington,  It is advisable that you have Covid 19 testing due to your symptoms of sore throat. Also, you may try OTC throat lozenges such as Cepacol for soothing the throat.       Your symptoms indicate a likely viral infection (Pharyngitis).   Pharyngitis is inflammation in the back of the throat which can cause a sore throat, scratchiness and sometimes difficulty swallowing.   Pharyngitis is typically caused by a respiratory virus , in your case, it maybe related to Covid 19  Please keep in mind that your symptoms could last  up to 10 days.  For throat pain, we recommend over the counter oral pain relief  medications such as acetaminophen or aspirin, or anti-inflammatory medications such as ibuprofen or naproxen sodium.  Topical treatments such as oral throat lozenges or sprays may be used as needed.  Avoid close contact with loved ones, especially the very young and elderly.  Remember to wash your hands thoroughly throughout the day as this is the number one way to prevent the spread of infection and wipe down door knobs and counters with disinfectant.  After careful review of your answers, I would not recommend and antibiotic for your condition.  Antibiotics should not be used to treat conditions that we suspect are caused by viruses like Covid 19.  You may also take acetaminophen (Tylenol) as needed for fever.  Reduce your risk of any infection by using the same precautions used for avoiding the common cold or flu:  Wash your hands often with soap and warm water for at least 20 seconds.  If soap and water are not readily available, use an alcohol-based hand sanitizer with at least 60% alcohol.  If coughing or sneezing, cover your mouth and nose by coughing or sneezing into the elbow areas of your shirt or coat, into a tissue or into your sleeve (not your hands). Avoid shaking hands with others and consider head nods or verbal greetings only. Avoid touching your eyes, nose, or mouth with unwashed hands.  Avoid close contact with people who are sick. Avoid places or events with large numbers of people in one location, like concerts or sporting events. Carefully consider travel plans you have or are making. If you are planning any travel outside or inside the [place], visit the CDC's Travelers' Health webpage for the latest health notices. If you have some symptoms but not all symptoms, continue to monitor at home and seek medical attention if your symptoms worsen. If you are having a medical emergency, call 911.  HOME CARE Only take medications as instructed by your medical team. Drink plenty of  fluids and get plenty of rest. A steam or ultrasonic humidifier can help if you have congestion.   GET HELP RIGHT AWAY IF YOU HAVE EMERGENCY WARNING SIGNS** FOR COVID-19. If you or someone is showing any of these signs seek emergency medical care immediately. Call 911 or proceed to your closest emergency facility if: You develop worsening high fever. Trouble breathing Bluish lips or face Persistent pain or pressure in the chest New confusion Inability to wake or stay awake You cough up blood. Your symptoms become more severe  **This list is not all possible symptoms. Contact your medical provider for any symptoms that are sever or concerning to you.  MAKE SURE YOU  Understand these instructions. Will watch your condition. Will get help right away if you are not doing well or get worse.  Your e-visit answers were reviewed by a board certified advanced clinical practitioner to complete your personal care plan.  Depending on the condition, your plan could have included both over the counter or prescription medications.  If there is a problem please reply once you have received a response from your provider.  Your safety is important to [place].  If you have drug allergies check your prescription carefully.    You can use MyChart to ask questions about today's visit, request a non-urgent call back, or ask for a work or school excuse for 24 hours related to this e-Visit. If it has been greater than  24 hours you will need to follow up with your provider, or enter a new e-Visit to address those concerns. You will get an e-mail in the next two days asking about your experience.  I hope that your e-visit has been valuable and will speed your recovery. Thank you for using e-visits.  I spent 5-10 minutes on review and completion of this note- Catherine Washington [facility]

## 2021-05-14 ENCOUNTER — Other Ambulatory Visit: Payer: Self-pay | Admitting: Family Medicine

## 2021-06-22 ENCOUNTER — Encounter: Payer: Self-pay | Admitting: Gastroenterology

## 2021-07-03 ENCOUNTER — Ambulatory Visit
Admission: RE | Admit: 2021-07-03 | Discharge: 2021-07-03 | Disposition: A | Discharge status: Home / Self Care | Payer: BC Managed Care – PPO | Source: Ambulatory Visit | Attending: Obstetrics and Gynecology | Admitting: Obstetrics and Gynecology

## 2021-07-03 ENCOUNTER — Other Ambulatory Visit: Payer: Self-pay

## 2021-07-03 DIAGNOSIS — N6320 Unspecified lump in the left breast, unspecified quadrant: Secondary | ICD-10-CM

## 2021-07-11 ENCOUNTER — Ambulatory Visit (INDEPENDENT_AMBULATORY_CARE_PROVIDER_SITE_OTHER): Payer: BC Managed Care – PPO | Admitting: Gastroenterology

## 2021-07-11 ENCOUNTER — Other Ambulatory Visit: Payer: Self-pay

## 2021-07-11 ENCOUNTER — Encounter: Payer: Self-pay | Admitting: Gastroenterology

## 2021-07-11 ENCOUNTER — Other Ambulatory Visit (INDEPENDENT_AMBULATORY_CARE_PROVIDER_SITE_OTHER): Payer: BC Managed Care – PPO

## 2021-07-11 VITALS — BP 118/78 | HR 80 | Ht 64.0 in | Wt 156.1 lb

## 2021-07-11 DIAGNOSIS — K5900 Constipation, unspecified: Secondary | ICD-10-CM

## 2021-07-11 DIAGNOSIS — D649 Anemia, unspecified: Secondary | ICD-10-CM

## 2021-07-11 DIAGNOSIS — Z9884 Bariatric surgery status: Secondary | ICD-10-CM | POA: Diagnosis not present

## 2021-07-11 DIAGNOSIS — R109 Unspecified abdominal pain: Secondary | ICD-10-CM

## 2021-07-11 LAB — FERRITIN: Ferritin: 3 ng/mL — ABNORMAL LOW (ref 10.0–291.0)

## 2021-07-11 MED ORDER — CLENPIQ 10-3.5-12 MG-GM -GM/160ML PO SOLN: 1.0000 | ORAL | 0 refills | Status: DC

## 2021-07-11 NOTE — Progress Notes (Signed)
Chief Complaint: Constipation   Referring Provider:     Eulas Post, MD   HPI:     Catherine Washington is a 45 y.o. female with a history of depression, hyperlipidemia, hysterectomy 2010, history of Roux-en-Y gastric bypass 2016, cholecystectomy 2013, referred to the [facility]ology Clinic for evaluation of constipation.  Has been having constipation for many years with associated abdominal bloating, increased gas.  Started after gastric bypass in 2016 and progressively worsening. Has been treating with MiraLAX  daily for years which improved symptoms.  Can have episodic left-sided abdominal pain, which typically coincides with exacerbations of constipation. No hematochezia, melena.  Does drink plenty of water/day.  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  No increased risk screening recommendations were needed.  Reviewed recent labs from 01/2021: - H/H 11.6/35.6.  Chronic anemia since 2016 but MCV/RDW have changed.  Now 81/16 and previously mid 80s/14 - Normal CMP, B12, vitamin D, TSH   Past Medical History:  Diagnosis Date   Allergy    Anxiety    Chicken pox    Depression    Family history of adverse reaction to anesthesia    mother gets very nausaus   Family history of breast cancer    Family history of breast cancer    Family history of kidney cancer    Family history of melanoma    Family history of ovarian cancer    Family history of thyroid cancer    Headache    occasionally   Hyperlipidemia    Ulcerative colitis ([place])    UTI (urinary tract infection)      Past Surgical History:  Procedure Laterality Date   ABDOMINAL HYSTERECTOMY  2006   dysfunctional bleeding   BREATH TEK H PYLORI N/A 12/06/2014   Procedure: BREATH TEK H PYLORI;  Surgeon: Greer Pickerel, MD;  Location: [facility];  Service: General;  Laterality: N/A;   childbirth  1995, 2003    CHOLECYSTECTOMY     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   Procedure: LAPAROSCOPIC ROUX-EN-Y GASTRIC BYPASS WITH UPPER ENDOSCOPY;  Surgeon: Greer Pickerel, MD;  Location: [facility];  Service: General;  Laterality: N/A;   Family History  Problem Relation Age of Onset   Hyperlipidemia Mother    Mental illness Mother    Diabetes Mother        type 2 diabetes   Hyperlipidemia Father    Heart disease Father 30       MI   Hypertension Father    Diabetes Father    Thyroid cancer Father 15   Melanoma Father    Breast cancer Maternal Aunt        diagonsed x2, first in 27s   Cancer Maternal Aunt 64       metastatic; lung, liver, panc, spleen   Diabetes Maternal Grandmother    Kidney cancer Maternal Grandmother    Melanoma Maternal Grandmother    Ovarian cancer Paternal Grandmother        dx late 48s   Stroke Paternal Grandmother    Lung cancer Paternal Grandmother        dx 5s, hx smoking   Diabetes Paternal Grandfather    Social History   Tobacco Use   Smoking status: Never   Smokeless tobacco: Never  Vaping Use   Vaping Use: Never used  Substance Use Topics  Alcohol use: No   Drug use: No   Current Outpatient Medications  Medication Sig Dispense Refill   buPROPion (WELLBUTRIN) 100 MG tablet TAKE 1 TABLET (100 MG TOTAL) BY MOUTH 3 (THREE) TIMES DAILY. 270 tablet 1   [facility] MELATONIN PO Take by mouth.     fexofenadine (ALLEGRA) 180 MG tablet Take 180 mg by mouth daily.     Multiple Vitamin (MULTIVITAMIN) tablet Take 1 tablet by mouth daily.     Polyethylene Glycol 3350 (MIRALAX PO) Take by mouth. Taking everyday     venlafaxine XR (EFFEXOR-XR) 37.5 MG 24 hr capsule TAKE 1 CAPSULE BY MOUTH DAILY WITH BREAKFAST. 90 capsule 3   vitamin B-12 (CYANOCOBALAMIN) 500 MCG tablet Take 500 mcg by mouth daily.     No current facility-administered medications for this visit.   Allergies  Allergen Reactions   Adhesive [Tape] Other (See Comments)    Blisters   Nitrofurantoin Monohyd Macro     GI  upset     Review of Systems: All systems reviewed and negative except where noted in HPI.     Physical Exam:    Wt Readings from Last 3 Encounters:  02/08/21 146 lb 6.4 oz (66.4 kg)  08/03/20 146 lb (66.2 kg)  02/24/15 239 lb (108.4 kg)    There were no vitals taken for this visit. Constitutional:  Pleasant, in no acute distress. Psychiatric: Normal mood and affect. Behavior is normal. Cardiovascular: Normal rate, regular rhythm. No edema Pulmonary/chest: Effort normal and breath sounds normal. No wheezing, rales or rhonchi. Abdominal: Mild nonfocal left-sided abdominal pain.  No rebound, guarding, peritoneal signs.  Soft, nondistended. Bowel sounds active throughout. There are no masses palpable. No hepatomegaly. Neurological: Alert and oriented to person place and time. Skin: Skin is warm and dry. No rashes noted.   ASSESSMENT AND PLAN;   1) Chronic constipation 2) Left-sided abdominal pain - Colonoscopy to evaluate for mucosal/luminal pathology - Discussed the role of sits marker study, and will hold off at this juncture - Symptoms largely manageable with MiraLAX daily.  Discussed changing to Linzess, Amitiza, etc., but elected to hold off for now - Continue adequate hydration as currently doing - Symptoms do not sound consistent with pelvic floor dyssynergia, so not ordering ARM  3) History of Roux-en-Y gastric bypass 4) Chronic anemia - H/H largely stable from 2016, but decreasing MCV with increasing RDW, suggestive of iron deficiency, likely due to prior surgery - Check ferritin, iron panel - Otherwise no upper GI symptoms to warrant EGD at this juncture.  If significant iron deficiency panel, could consider upper endoscopy as appropriate  The indications, risks, and benefits of colonoscopy were explained to the patient in detail. Risks include but are not limited to bleeding, perforation, adverse reaction to medications, and cardiopulmonary compromise. Sequelae  include but are not limited to the possibility of surgery, hospitalization, and mortality. The patient verbalized understanding and wished to proceed. All questions answered, referred to the scheduler and bowel prep ordered. Further recommendations pending results of the exam.     Lavena Bullion, DO, FACG  07/11/2021, 10:21 AM   [place], Alinda Sierras, MD

## 2021-07-11 NOTE — Patient Instructions (Signed)
If you are age 45 or older, your body mass index should be between 23-30. Your Body mass index is 26.8 kg/m. If this is out of the aforementioned range listed, please consider follow up with your Primary Care Provider.  If you are age 41 or younger, your body mass index should be between 19-25. Your Body mass index is 26.8 kg/m. If this is out of the aformentioned range listed, please consider follow up with your Primary Care Provider.   __________________________________________________________  The [facility] providers would like to encourage you to use [facility] to communicate with providers for non-urgent requests or questions.  Due to long hold times on the telephone, sending your provider a message by [facility] may be a faster and more efficient way to get a response.  Please allow 48 business hours for a response.  Please remember that this is for non-urgent requests.   Please go to the lab on the 2nd floor [address] before you leave the office today.   We have sent the following medications to your pharmacy for you to pick up at your convenience:  Clenpiq for Colonoscopy prep  Due to recent changes in healthcare laws, you may see the results of your imaging and laboratory studies on MyChart before your provider has had a chance to review them.  We understand that in some cases there may be results that are confusing or concerning to you. Not all laboratory results come back in the same time frame and the provider may be waiting for multiple results in order to interpret others.  Please give [place] 48 hours in order for your provider to thoroughly review all the results before contacting the office for clarification of your results.   Thank you for choosing me and [facility].  Vito Cirigliano, D.O.

## 2021-07-12 ENCOUNTER — Telehealth: Payer: Self-pay | Admitting: General Surgery

## 2021-07-12 DIAGNOSIS — Z9884 Bariatric surgery status: Secondary | ICD-10-CM

## 2021-07-12 DIAGNOSIS — D509 Iron deficiency anemia, unspecified: Secondary | ICD-10-CM

## 2021-07-12 LAB — IRON, TOTAL/TOTAL IRON BINDING CAP
%SAT: 6 % (calc) — ABNORMAL LOW (ref 16–45)
Iron: 31 ug/dL — ABNORMAL LOW (ref 40–190)
TIBC: 520 mcg/dL (calc) — ABNORMAL HIGH (ref 250–450)

## 2021-07-12 NOTE — Telephone Encounter (Signed)
-----   Message from [place], DO sent at 07/11/2021  5:15 PM EDT ----- Ferritin 3.0, consistent with iron deficiency, likely 2/2 prior Roux-en-Y gastric bypass surgery.  Will follow-up on remainder of iron panel, but based on these results, can start oral iron therapy now as follows: -Start ferrous sulfate 325 mg p.o. twice daily.  Take with vitamin C or orange juice. -Take iron supplement 2 hours before or 4 hours after PPI or other antacids as this requires some level of gastric acidity to aid in absorption -Repeat iron panel and CBC in 3 months -Pending clinical response, can discuss role/utility of upper endoscopy

## 2021-07-12 NOTE — Telephone Encounter (Signed)
Contacted patient discussed findings, sent patient a mychart message with all instructions. Placed order for iron panel and cbc in 09/2021

## 2021-07-19 ENCOUNTER — Telehealth: Payer: BC Managed Care – PPO | Admitting: Physician Assistant

## 2021-07-19 DIAGNOSIS — B9689 Other specified bacterial agents as the cause of diseases classified elsewhere: Secondary | ICD-10-CM | POA: Diagnosis not present

## 2021-07-19 DIAGNOSIS — J019 Acute sinusitis, unspecified: Secondary | ICD-10-CM | POA: Diagnosis not present

## 2021-07-19 MED ORDER — AMOXICILLIN-POT CLAVULANATE 875-125 MG PO TABS: 1.0000 | ORAL_TABLET | Freq: Two times a day (BID) | ORAL | 0 refills | Status: DC

## 2021-07-19 NOTE — Progress Notes (Signed)

## 2021-07-21 ENCOUNTER — Telehealth: Payer: Self-pay | Admitting: General Surgery

## 2021-07-21 NOTE — Telephone Encounter (Signed)
Sent a mychart message to patient regarding her labs

## 2021-07-21 NOTE — Telephone Encounter (Signed)
-----   Message from [place], DO sent at 07/20/2021  5:02 PM EDT ----- Reduced iron indices with iron 31, sat 6%, and elevated TIBC of 520.  This is all consistent with iron deficiency anemia.  This is likely due to previous Roux-en-Y gastric bypass, but is not unreasonable to proceed with upper endoscopy to evaluate for additional pathology, pending work-up that is already in place.  In the meantime, start oral iron therapy as follows: -Start ferrous sulfate 325 mg p.o. twice daily.  Take with vitamin C or orange juice. -Take iron supplement 2 hours before or 4 hours after PPI or other antacids as this requires some level of gastric acidity to aid in absorption -Repeat iron panel and CBC in 3 months - Stop iron 7 days prior to colonoscopy

## 2021-07-26 ENCOUNTER — Encounter: Payer: Self-pay | Admitting: Gastroenterology

## 2021-08-09 ENCOUNTER — Encounter: Payer: Self-pay | Admitting: Gastroenterology

## 2021-08-09 ENCOUNTER — Other Ambulatory Visit: Payer: Self-pay

## 2021-08-09 ENCOUNTER — Ambulatory Visit (AMBULATORY_SURGERY_CENTER): Payer: BC Managed Care – PPO | Admitting: Gastroenterology

## 2021-08-09 ENCOUNTER — Encounter: Payer: BC Managed Care – PPO | Admitting: Gastroenterology

## 2021-08-09 VITALS — BP 133/75 | HR 67 | Temp 99.3°F | Resp 12 | Ht 64.0 in | Wt 156.0 lb

## 2021-08-09 DIAGNOSIS — D509 Iron deficiency anemia, unspecified: Secondary | ICD-10-CM | POA: Diagnosis not present

## 2021-08-09 DIAGNOSIS — K5900 Constipation, unspecified: Secondary | ICD-10-CM | POA: Diagnosis not present

## 2021-08-09 DIAGNOSIS — R109 Unspecified abdominal pain: Secondary | ICD-10-CM | POA: Diagnosis not present

## 2021-08-09 DIAGNOSIS — R140 Abdominal distension (gaseous): Secondary | ICD-10-CM

## 2021-08-09 MED ORDER — SODIUM CHLORIDE 0.9 % IV SOLN: 500.0000 mL | INTRAVENOUS | Status: DC

## 2021-08-09 NOTE — Op Note (Signed)
[facility] Patient Name: Catherine Washington Procedure Date: 08/09/2021 8:21 AM MRN: [id number] Endoscopist: Gerrit Heck , MD Age: 45 Referring MD:  Date of Birth: 09-Feb-1976 Gender: Female Account #: [id number] Procedure:                Colonoscopy Indications:              Iron deficiency anemia, Constipation, Bloating Medicines:                Monitored Anesthesia Care Procedure:                Pre-Anesthesia Assessment:                           - Prior to the procedure, a History and Physical                            was performed, and patient medications and                            allergies were reviewed. The patient's tolerance of                            previous anesthesia was also reviewed. The risks                            and benefits of the procedure and the sedation                            options and risks were discussed with the patient.                            All questions were answered, and informed consent                            was obtained. Prior Anticoagulants: The patient has                            taken no previous anticoagulant or antiplatelet                            agents. ASA Grade Assessment: II - A patient with                            mild systemic disease. After reviewing the risks                            and benefits, the patient was deemed in                            satisfactory condition to undergo the procedure.                           After obtaining informed consent, the colonoscope  was passed under direct vision. Throughout the                            procedure, the patient's blood pressure, pulse, and                            oxygen saturations were monitored continuously. The                            Olympus CF-HQ190L [id number]) Colonoscope was                            introduced through the anus and advanced to the the                            terminal  ileum. The colonoscopy was performed                            without difficulty. The patient tolerated the                            procedure well. The quality of the bowel                            preparation was excellent. The terminal ileum,                            ileocecal valve, appendiceal orifice, and rectum                            were photographed. Scope In: 8:44:54 AM Scope Out: 8:59:38 AM Scope Withdrawal Time: 0 hours 8 minutes 52 seconds  Total Procedure Duration: 0 hours 14 minutes 44 seconds  Findings:                 The perianal and digital rectal examinations were                            normal.                           The colon appeared normal throughout.                           The retroflexed view of the distal rectum and anal                            verge was normal and showed no anal or rectal                            abnormalities.                           The terminal ileum appeared normal. Complications:            No immediate complications. Estimated Blood Loss:  Estimated blood loss: none. Impression:               - The entire examined colon is normal.                           - The distal rectum and anal verge are normal on                            retroflexion view.                           - The examined portion of the ileum was normal.                           - No specimens collected. Recommendation:           - Patient has a contact number available for                            emergencies. The signs and symptoms of potential                            delayed complications were discussed with the                            patient. Return to normal activities tomorrow.                            Written discharge instructions were provided to the                            patient.                           - Resume previous diet.                           - Continue present medications.                            - Repeat colonoscopy in 10 years for screening                            purposes.                           - Return to GI office PRN. Gerrit Heck, MD 08/09/2021 9:02:49 AM

## 2021-08-09 NOTE — Progress Notes (Signed)
Pt's states no medical or surgical changes since previsit or office visit. 

## 2021-08-09 NOTE — Patient Instructions (Signed)
YOU HAD AN ENDOSCOPIC PROCEDURE TODAY AT THE [facility]:   Refer to the procedure report that was given to you for any specific questions about what was found during the examination.  If the procedure report does not answer your questions, please call your gastroenterologist to clarify.  If you requested that your care partner not be given the details of your procedure findings, then the procedure report has been included in a sealed envelope for you to review at your convenience later.  YOU SHOULD EXPECT: Some feelings of bloating in the abdomen. Passage of more gas than usual.  Walking can help get rid of the air that was put into your GI tract during the procedure and reduce the bloating. If you had a lower endoscopy (such as a colonoscopy or flexible sigmoidoscopy) you may notice spotting of blood in your stool or on the toilet paper. If you underwent a bowel prep for your procedure, you may not have a normal bowel movement for a few days.  Please Note:  You might notice some irritation and congestion in your nose or some drainage.  This is from the oxygen used during your procedure.  There is no need for concern and it should clear up in a day or so.  SYMPTOMS TO REPORT IMMEDIATELY:   Following lower endoscopy (colonoscopy or flexible sigmoidoscopy):  Excessive amounts of blood in the stool  Significant tenderness or worsening of abdominal pains  Swelling of the abdomen that is new, acute  Fever of 100F or higher  For urgent or emergent issues, a gastroenterologist can be reached at any hour by calling [phone]. Do not use MyChart messaging for urgent concerns.    DIET:  We do recommend a small meal at first, but then you may proceed to your regular diet.  Drink plenty of fluids but you should avoid alcoholic beverages for 24 hours.  ACTIVITY:  You should plan to take it easy for the rest of today and you should NOT DRIVE or use heavy machinery until tomorrow (because  of the sedation medicines used during the test).    FOLLOW UP: Our staff will call the number listed on your records 48-72 hours following your procedure to check on you and address any questions or concerns that you may have regarding the information given to you following your procedure. If we do not reach you, we will leave a message.  We will attempt to reach you two times.  During this call, we will ask if you have developed any symptoms of COVID 19. If you develop any symptoms (ie: fever, flu-like symptoms, shortness of breath, cough etc.) before then, please call [phone].  If you test positive for Covid 19 in the 2 weeks post procedure, please call and report this information to us.    If any biopsies were taken you will be contacted by phone or by letter within the next 1-3 weeks.  Please call us at [phone] if you have not heard about the biopsies in 3 weeks.    SIGNATURES/CONFIDENTIALITY: You and/or your care partner have signed paperwork which will be entered into your electronic medical record.  These signatures attest to the fact that that the information above on your After Visit Summary has been reviewed and is understood.  Full responsibility of the confidentiality of this discharge information lies with you and/or your care-partner. 

## 2021-08-09 NOTE — Progress Notes (Signed)
GASTROENTEROLOGY PROCEDURE H&P NOTE   Primary Care Physician: Eulas Post, MD    Reason for Procedure:  Constipation, abdominal bloating, left-sided abdominal pain  Plan:    Colonoscopy  Patient is appropriate for endoscopic procedure(s) in the ambulatory ([facility]) setting.  The nature of the procedure, as well as the risks, benefits, and alternatives were carefully and thoroughly reviewed with the patient. Ample time for discussion and questions allowed. The patient understood, was satisfied, and agreed to proceed.     HPI: Catherine Washington is a 45 y.o. female who presents for colonoscopy for evaluation of constipation, abdominal bloating, left-sided abdominal pain.  Patient was most recently seen in the Gastroenterology Clinic on 07/11/2021 by me.  No interval change in medical history since that appointment. Please refer to that note for full details regarding GI history and clinical presentation.   Past Medical History:  Diagnosis Date   Allergy    Anxiety    Chicken pox    Depression    Family history of adverse reaction to anesthesia    mother gets very nausaus   Family history of breast cancer    Family history of breast cancer    Family history of kidney cancer    Family history of melanoma    Family history of ovarian cancer    Family history of thyroid cancer    Headache    occasionally   Hyperlipidemia    Ulcerative colitis ([place])    UTI (urinary tract infection)     Past Surgical History:  Procedure Laterality Date   ABDOMINAL HYSTERECTOMY  2006   dysfunctional bleeding   BREATH TEK H PYLORI N/A 12/06/2014   Procedure: BREATH TEK H PYLORI;  Surgeon: Greer Pickerel, MD;  Location: [facility];  Service: General;  Laterality: N/A;   childbirth  1995, 2003   CHOLECYSTECTOMY     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 Authorizing Provider  buPROPion (WELLBUTRIN) 100 MG tablet TAKE 1 TABLET (100 MG TOTAL) BY MOUTH 3 (THREE) TIMES DAILY. 05/15/21  Yes Burchette, Alinda Sierras, MD  [facility] MELATONIN PO Take by mouth.   Yes [provider]  fexofenadine (ALLEGRA) 180 MG tablet Take 180 mg by mouth daily.   Yes [provider]  Multiple Vitamin (MULTIVITAMIN) tablet Take 1 tablet by mouth daily.   Yes [provider]  venlafaxine XR (EFFEXOR-XR) 37.5 MG 24 hr capsule TAKE 1 CAPSULE BY MOUTH DAILY WITH BREAKFAST. 03/22/21  Yes Burchette, Alinda Sierras, MD  vitamin B-12 (CYANOCOBALAMIN) 500 MCG tablet Take 500 mcg by mouth daily.   Yes [provider]  Polyethylene Glycol 3350 (MIRALAX PO) Take by mouth. Taking everyday    [provider]  Sod Picosulfate-Mag Ox-Cit Acd (CLENPIQ) 10-3.5-12 MG-GM -GM/160ML SOLN Take 1 kit by mouth as directed. 07/11/21   Krishana Lutze, Dominic Pea, DO    Current Outpatient Medications  Medication Sig Dispense Refill   buPROPion (WELLBUTRIN) 100 MG tablet TAKE 1 TABLET (100 MG TOTAL) BY MOUTH 3 (THREE) TIMES DAILY. 270 tablet 1   [facility]S MELATONIN PO Take by mouth.     fexofenadine (ALLEGRA) 180 MG tablet Take 180 mg by mouth daily.     Multiple Vitamin (MULTIVITAMIN) tablet Take 1 tablet by mouth daily.  venlafaxine XR (EFFEXOR-XR) 37.5 MG 24 hr capsule TAKE 1 CAPSULE BY MOUTH DAILY WITH BREAKFAST. 90 capsule 3   vitamin B-12 (CYANOCOBALAMIN) 500 MCG tablet Take 500 mcg by mouth daily.     Polyethylene Glycol 3350 (MIRALAX PO) Take by mouth. Taking everyday     Sod Picosulfate-Mag Ox-Cit Acd (CLENPIQ) 10-3.5-12 MG-GM -GM/160ML SOLN Take 1 kit by mouth as directed. 320 mL 0   Current Facility-Administered Medications  Medication Dose Route Frequency Provider Last Rate Last Admin   0.9 %  sodium chloride infusion  500 mL Intravenous Continuous Miranda Garber V, DO         Allergies as of 08/09/2021 - Review Complete 07/19/2021  Allergen Reaction Noted   Adhesive [tape] Other (See Comments) 02/23/2015   Nitrofurantoin monohyd macro  04/18/2011    Family History  Problem Relation Age of Onset   Hyperlipidemia Mother    Mental illness Mother    Diabetes Mother        type 2 diabetes   Hyperlipidemia Father    Heart disease Father 94       MI   Hypertension Father    Diabetes Father    Thyroid cancer Father 61   Melanoma Father    Diabetes Maternal Grandmother    Kidney cancer Maternal Grandmother    Melanoma Maternal Grandmother    Ovarian cancer Paternal Grandmother        dx late 71s   Stroke Paternal Grandmother    Lung cancer Paternal Grandmother        dx 78s, hx smoking   Diabetes Paternal Grandfather    Breast cancer Maternal Aunt        diagonsed x2, first in 60s   Cancer Maternal Aunt 64       metastatic; lung, liver, panc, spleen   Colon cancer Neg Hx    Rectal cancer Neg Hx    Esophageal cancer Neg Hx     Social History   Socioeconomic History   Marital status: Married    Spouse name: Not on file   Number of children: 2   Years of education: Not on file   Highest education level: Not on file  Occupational History   Not on file  Tobacco Use   Smoking status: Never   Smokeless tobacco: Never  Vaping Use   Vaping Use: Never used  Substance and Sexual Activity   Alcohol use: No   Drug use: No   Sexual activity: Not on file  Other Topics Concern   Not on file  Social History Narrative   Has 2 children  10 and 15 non smoker    Social Determinants of Radio broadcast assistant Strain: Not on file  Food Insecurity: Not on file  Transportation Needs: Not on file  Physical Activity: Not on file  Stress: Not on file  Social Connections: Not on file  Intimate Partner Violence: Not on file    Physical Exam: Vital signs in last 24 hours: '@BP'  129/73   Pulse 85   Temp 99.3 F (37.4 C)   Ht '5\' 4"'  (1.626 m)    Wt 156 lb (70.8 kg)   SpO2 100%   BMI 26.78 kg/m  GEN: NAD EYE: Sclerae anicteric ENT: MMM CV: Non-tachycardic Pulm: CTA b/l GI: Soft, NT/ND NEURO:  Alert & Oriented x 3   Gerrit Heck, DO [facility]   08/09/2021 8:38 AM

## 2021-08-09 NOTE — Progress Notes (Signed)
To PACU, VSS. Report to Rn.tb 

## 2021-08-15 ENCOUNTER — Telehealth: Payer: Self-pay

## 2021-08-15 NOTE — Telephone Encounter (Signed)
Left message on follow up call. 

## 2021-08-15 NOTE — Telephone Encounter (Signed)
Second attempt follow up call to pt, lm

## 2021-08-29 ENCOUNTER — Encounter: Payer: Self-pay | Admitting: Family Medicine

## 2021-08-29 ENCOUNTER — Encounter: Payer: Self-pay | Admitting: Gastroenterology

## 2021-08-29 MED ORDER — TRAZODONE HCL 50 MG PO TABS: ORAL_TABLET | ORAL | 1 refills | Status: DC

## 2021-08-30 MED ORDER — LINACLOTIDE 145 MCG PO CAPS: 145.0000 ug | ORAL_CAPSULE | Freq: Every day | ORAL | 2 refills | Status: DC

## 2021-08-30 NOTE — Telephone Encounter (Signed)
RX for Linzess 145 mcg/daily sent to patient's pharmacy on file. Pt notified of recommendations via my chart.

## 2021-08-30 NOTE — Telephone Encounter (Signed)
Okay to start Linzess 145 mcg/day.  Stop taking MiraLAX when starting the Linzess.  Please caution patient of abdominal cramping and diarrhea which are common in the first 7 days or so of the medication but should improve.  If suboptimal relief, plan to increase to 290 mcg/day.  Continue adequate hydration.

## 2021-10-13 ENCOUNTER — Encounter: Payer: Self-pay | Admitting: Family Medicine

## 2021-10-13 ENCOUNTER — Encounter: Payer: Self-pay | Admitting: Gastroenterology

## 2021-10-13 ENCOUNTER — Other Ambulatory Visit (INDEPENDENT_AMBULATORY_CARE_PROVIDER_SITE_OTHER): Payer: BC Managed Care – PPO

## 2021-10-13 DIAGNOSIS — Z9884 Bariatric surgery status: Secondary | ICD-10-CM

## 2021-10-13 DIAGNOSIS — D509 Iron deficiency anemia, unspecified: Secondary | ICD-10-CM

## 2021-10-13 NOTE — Addendum Note (Signed)
Addended by: Manuela Schwartz on: 10/13/2021 04:09 PM   Modules accepted: Orders

## 2021-10-14 LAB — FERRITIN: Ferritin: 16 ng/mL (ref 16–232)

## 2021-10-14 LAB — VITAMIN B12: Vitamin B-12: 1817 pg/mL — ABNORMAL HIGH (ref 200–1100)

## 2021-10-14 LAB — CBC
HCT: 37 % (ref 35.0–45.0)
Hemoglobin: 12.3 g/dL (ref 11.7–15.5)
MCH: 28.4 pg (ref 27.0–33.0)
MCHC: 33.2 g/dL (ref 32.0–36.0)
MCV: 85.5 fL (ref 80.0–100.0)
MPV: 9.4 fL (ref 7.5–12.5)
Platelets: 447 10*3/uL — ABNORMAL HIGH (ref 140–400)
RBC: 4.33 10*6/uL (ref 3.80–5.10)
RDW: 15.4 % — ABNORMAL HIGH (ref 11.0–15.0)
WBC: 7.1 10*3/uL (ref 3.8–10.8)

## 2021-10-14 LAB — IRON, TOTAL/TOTAL IRON BINDING CAP
%SAT: 13 % (calc) — ABNORMAL LOW (ref 16–45)
Iron: 54 ug/dL (ref 40–190)
TIBC: 416 mcg/dL (calc) (ref 250–450)

## 2021-10-14 LAB — FOLATE: Folate: 20.2 ng/mL

## 2021-10-20 ENCOUNTER — Telehealth: Payer: Self-pay | Admitting: General Surgery

## 2021-10-20 DIAGNOSIS — Z9884 Bariatric surgery status: Secondary | ICD-10-CM

## 2021-10-20 DIAGNOSIS — D509 Iron deficiency anemia, unspecified: Secondary | ICD-10-CM

## 2021-10-20 NOTE — Telephone Encounter (Signed)
Spoke with the patient and she is taking ferrous sulfate 325 daily. Expressed to her that we will send a referral to hematology for possible IV iron  and we will plan to schedule an EGD for pathology. Patient verbalized understanding

## 2021-10-20 NOTE — Telephone Encounter (Signed)
-----   Message from [place], DO sent at 10/20/2021  1:13 PM EST ----- Labs reviewed and notable for the following: - Elevated B12 at 1817.  He is taking B12 supplements, advised stopping - Mildly elevated platelets at 447, otherwise CBC normal.  No anemia. 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  Please confirm patient has been taking oral iron.  If so, given persistent iron deficiency plan for the following: - EGD to evaluate for additional pathology -Referral for IV iron for suspected reduced absorption capacity postoperatively

## 2021-10-25 ENCOUNTER — Telehealth: Payer: Self-pay | Admitting: General Surgery

## 2021-10-25 ENCOUNTER — Encounter: Payer: Self-pay | Admitting: Gastroenterology

## 2021-10-25 DIAGNOSIS — Z9884 Bariatric surgery status: Secondary | ICD-10-CM

## 2021-10-25 DIAGNOSIS — D509 Iron deficiency anemia, unspecified: Secondary | ICD-10-CM

## 2021-10-25 NOTE — Telephone Encounter (Signed)
OPENED IN ERROR

## 2021-10-25 NOTE — Telephone Encounter (Signed)
-----   Message from [place], DO sent at 10/20/2021  1:13 PM EST ----- Labs reviewed and notable for the following: - Elevated B12 at 1817.  He is taking B12 supplements, advised stopping - Mildly elevated platelets at 447, otherwise CBC normal.  No anemia. 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  Please confirm patient has been taking oral iron.  If so, given persistent iron deficiency plan for the following: - EGD to evaluate for additional pathology -Referral for IV iron for suspected reduced absorption capacity postoperatively

## 2021-10-25 NOTE — Telephone Encounter (Signed)
-----   Message from [place], DO sent at 10/20/2021  1:13 PM EST ----- Labs reviewed and notable for the following: - Elevated B12 at 1817.  He is taking B12 supplements, advised stopping - Mildly elevated platelets at 447, otherwise CBC normal.  No anemia. 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  Please confirm patient has been taking oral iron.  If so, given persistent iron deficiency plan for the following: - EGD to evaluate for additional pathology -Referral for IV iron for suspected reduced absorption capacity postoperatively

## 2021-10-25 NOTE — Telephone Encounter (Signed)
Spoke with the patient and she scheduled an EGD for 11/21/2021. Will send instructions via mychart. Pt verbalized all understanding and will call with any questions

## 2021-10-31 ENCOUNTER — Other Ambulatory Visit: Payer: Self-pay

## 2021-10-31 ENCOUNTER — Encounter: Payer: Self-pay | Admitting: Family

## 2021-10-31 ENCOUNTER — Other Ambulatory Visit: Payer: Self-pay | Admitting: Family

## 2021-10-31 ENCOUNTER — Inpatient Hospital Stay: Payer: BC Managed Care – PPO | Admitting: Family

## 2021-10-31 ENCOUNTER — Inpatient Hospital Stay: Payer: BC Managed Care – PPO | Attending: Hematology & Oncology

## 2021-10-31 VITALS — BP 125/63 | HR 84 | Temp 98.9°F | Resp 18 | Ht 65.0 in | Wt 169.5 lb

## 2021-10-31 DIAGNOSIS — Z823 Family history of stroke: Secondary | ICD-10-CM | POA: Diagnosis not present

## 2021-10-31 DIAGNOSIS — Z87440 Personal history of urinary (tract) infections: Secondary | ICD-10-CM | POA: Diagnosis not present

## 2021-10-31 DIAGNOSIS — R5383 Other fatigue: Secondary | ICD-10-CM | POA: Insufficient documentation

## 2021-10-31 DIAGNOSIS — Z79899 Other long term (current) drug therapy: Secondary | ICD-10-CM | POA: Insufficient documentation

## 2021-10-31 DIAGNOSIS — Z803 Family history of malignant neoplasm of breast: Secondary | ICD-10-CM | POA: Diagnosis not present

## 2021-10-31 DIAGNOSIS — R6883 Chills (without fever): Secondary | ICD-10-CM | POA: Insufficient documentation

## 2021-10-31 DIAGNOSIS — D649 Anemia, unspecified: Secondary | ICD-10-CM

## 2021-10-31 DIAGNOSIS — E785 Hyperlipidemia, unspecified: Secondary | ICD-10-CM | POA: Insufficient documentation

## 2021-10-31 DIAGNOSIS — Z9884 Bariatric surgery status: Secondary | ICD-10-CM | POA: Insufficient documentation

## 2021-10-31 DIAGNOSIS — D508 Other iron deficiency anemias: Secondary | ICD-10-CM

## 2021-10-31 DIAGNOSIS — Z881 Allergy status to other antibiotic agents status: Secondary | ICD-10-CM | POA: Insufficient documentation

## 2021-10-31 DIAGNOSIS — Z808 Family history of malignant neoplasm of other organs or systems: Secondary | ICD-10-CM | POA: Diagnosis not present

## 2021-10-31 DIAGNOSIS — Z888 Allergy status to other drugs, medicaments and biological substances status: Secondary | ICD-10-CM | POA: Diagnosis not present

## 2021-10-31 DIAGNOSIS — Z8349 Family history of other endocrine, nutritional and metabolic diseases: Secondary | ICD-10-CM

## 2021-10-31 DIAGNOSIS — R42 Dizziness and giddiness: Secondary | ICD-10-CM | POA: Insufficient documentation

## 2021-10-31 DIAGNOSIS — Z9049 Acquired absence of other specified parts of digestive tract: Secondary | ICD-10-CM

## 2021-10-31 DIAGNOSIS — K909 Intestinal malabsorption, unspecified: Secondary | ICD-10-CM | POA: Diagnosis not present

## 2021-10-31 DIAGNOSIS — Z833 Family history of diabetes mellitus: Secondary | ICD-10-CM

## 2021-10-31 DIAGNOSIS — Z8041 Family history of malignant neoplasm of ovary: Secondary | ICD-10-CM | POA: Insufficient documentation

## 2021-10-31 DIAGNOSIS — Z801 Family history of malignant neoplasm of trachea, bronchus and lung: Secondary | ICD-10-CM | POA: Diagnosis not present

## 2021-10-31 DIAGNOSIS — Z8249 Family history of ischemic heart disease and other diseases of the circulatory system: Secondary | ICD-10-CM | POA: Insufficient documentation

## 2021-10-31 DIAGNOSIS — Z8051 Family history of malignant neoplasm of kidney: Secondary | ICD-10-CM | POA: Insufficient documentation

## 2021-10-31 DIAGNOSIS — D509 Iron deficiency anemia, unspecified: Secondary | ICD-10-CM

## 2021-10-31 LAB — CMP (CANCER CENTER ONLY)
ALT: 22 U/L (ref 0–44)
AST: 23 U/L (ref 15–41)
Albumin: 3.9 g/dL (ref 3.5–5.0)
Alkaline Phosphatase: 57 U/L (ref 38–126)
Anion gap: 4 — ABNORMAL LOW (ref 5–15)
BUN: 9 mg/dL (ref 6–20)
CO2: 30 mmol/L (ref 22–32)
Calcium: 8.6 mg/dL — ABNORMAL LOW (ref 8.9–10.3)
Chloride: 106 mmol/L (ref 98–111)
Creatinine: 0.8 mg/dL (ref 0.44–1.00)
GFR, Estimated: >60 mL/min (ref >60)
Glucose, Bld: 95 mg/dL (ref 70–99)
Potassium: 4.7 mmol/L (ref 3.5–5.1)
Sodium: 140 mmol/L (ref 135–145)
Total Bilirubin: 0.3 mg/dL (ref 0.3–1.2)
Total Protein: 6.5 g/dL (ref 6.5–8.1)

## 2021-10-31 LAB — CBC WITH DIFFERENTIAL (CANCER CENTER ONLY)
Abs Immature Granulocytes: 0.06 10*3/uL (ref 0.00–0.07)
Basophils Absolute: 0.1 10*3/uL (ref 0.0–0.1)
Basophils Relative: 1 %
Eosinophils Absolute: 0.5 10*3/uL (ref 0.0–0.5)
Eosinophils Relative: 8 %
HCT: 38.5 % (ref 36.0–46.0)
Hemoglobin: 12.4 g/dL (ref 12.0–15.0)
Immature Granulocytes: 1 %
Lymphocytes Relative: 34 %
Lymphs Abs: 2.1 10*3/uL (ref 0.7–4.0)
MCH: 28.3 pg (ref 26.0–34.0)
MCHC: 32.2 g/dL (ref 30.0–36.0)
MCV: 87.9 fL (ref 80.0–100.0)
Monocytes Absolute: 0.5 10*3/uL (ref 0.1–1.0)
Monocytes Relative: 8 %
Neutro Abs: 2.9 10*3/uL (ref 1.7–7.7)
Neutrophils Relative %: 48 %
Platelet Count: 391 10*3/uL (ref 150–400)
RBC: 4.38 MIL/uL (ref 3.87–5.11)
RDW: 14.6 % (ref 11.5–15.5)
WBC Count: 6 10*3/uL (ref 4.0–10.5)
nRBC: 0 % (ref 0.0–0.2)

## 2021-10-31 LAB — SAVE SMEAR(SSMR), FOR PROVIDER SLIDE REVIEW

## 2021-10-31 LAB — FERRITIN: Ferritin: 19 ng/mL (ref 11–307)

## 2021-10-31 LAB — RETICULOCYTES
Immature Retic Fract: 7.3 % (ref 2.3–15.9)
RBC.: 4.4 MIL/uL (ref 3.87–5.11)
Retic Count, Absolute: 50.2 10*3/uL (ref 19.0–186.0)
Retic Ct Pct: 1.1 % (ref 0.4–3.1)

## 2021-10-31 LAB — LACTATE DEHYDROGENASE: LDH: 142 U/L (ref 98–192)

## 2021-10-31 NOTE — Progress Notes (Signed)
Hematology/Oncology Consultation   Name: Catherine Washington      MRN: [id number]    Location: Room/bed info not found  Date: 10/31/2021 Time:9:24 AM   REFERRING PHYSICIAN: Vito Cirigliano, DO  REASON FOR CONSULT: Iron deficiency anemia    DIAGNOSIS: iron deficiency anemia secondary to malabsorption, Rou-en-y gastric bypass in 2016  HISTORY OF PRESENT ILLNESS:  Catherine Washington is a very pleasant 46 yo caucasian female with history of anemia starting in her childhood. This has worsened over the last year.  She had the Rou-EN-Y gastric bypass in 2016.  Iron saturation several weeks ago was 13% and ferritin 16.  Hgb today is 12.4, MCV 87, platelets 391 and WBC count 6.0.  She has not noted any abnormal blood loss. No bruising or petechiae.  She had a partial hysterectomy at 46 yo due to an IUD perforating the uterus.  She has 2 children and no history of miscarriage.  She is symptomatic with fatigue, chills and dizziness at times.  She states that she has never had any complications with surgery.  No history of diabetes or thyroid disease.  No personal history of cancer. Family history includes: paternal grandmother with ovarian, maternal aunt with breast, maternal aunt with metastatic disease of unknown primary and father with thyroid, melanoma and basal cell carcinoma.  She states that she has had genetic testing and results were negative.  She follows up with dermatology once a year.  No fever, chills, n/v, cough, rash, SOB, chest pain, palpitations, abdominal pain or changes in bowel or bladder habits.  She has chronic constipation and takes Miralax daily.  She had a colonoscopy in November 2022 and results were negative.  No swelling, tenderness, numbness or tingling in her extremities.  No falls or syncope reported.  No smoking, ETOH or recreational drug use.  She has maintained a healthy appetite and does eat meat. She is doing her best to stay well hydrated throughout the day. Her weight is  169 lbs.   ROS: All other 10 point review of systems is negative.   PAST MEDICAL HISTORY:   Past Medical History:  Diagnosis Date   Allergy    Anxiety    Chicken pox    Depression    Family history of adverse reaction to anesthesia    mother gets very nausaus   Family history of breast cancer    Family history of breast cancer    Family history of kidney cancer    Family history of melanoma    Family history of ovarian cancer    Family history of thyroid cancer    Headache    occasionally   Hyperlipidemia    Ulcerative colitis ([place])    UTI (urinary tract infection)     ALLERGIES: Allergies  Allergen Reactions   Adhesive [Tape] Other (See Comments)    Blisters   Nitrofurantoin Monohyd Macro     GI upset      MEDICATIONS:  Current Outpatient Medications on File Prior to Visit  Medication Sig Dispense Refill   buPROPion (WELLBUTRIN) 100 MG tablet TAKE 1 TABLET (100 MG TOTAL) BY MOUTH 3 (THREE) TIMES DAILY. 270 tablet 1   Ferrous Sulfate (IRON) 325 (65 Fe) MG TABS Take by mouth.     fexofenadine (ALLEGRA) 180 MG tablet Take 180 mg by mouth daily.     Multiple Vitamin (MULTIVITAMIN) tablet Take 1 tablet by mouth daily.     Polyethylene Glycol 3350 (MIRALAX PO) Take by mouth. Taking everyday  traZODone (DESYREL) 50 MG tablet Take 1 tablet by mouth at night as needed for insomnia. 30 tablet 1   venlafaxine XR (EFFEXOR-XR) 37.5 MG 24 hr capsule TAKE 1 CAPSULE BY MOUTH DAILY WITH BREAKFAST. 90 capsule 3   vitamin B-12 (CYANOCOBALAMIN) 500 MCG tablet Take 500 mcg by mouth daily.     vitamin C (ASCORBIC ACID) 500 MG tablet Take 500 mg by mouth daily.     [facility] MELATONIN PO Take by mouth.     diclofenac (VOLTAREN) 75 MG EC tablet Take 75 mg by mouth 2 (two) times daily.     linaclotide (LINZESS) 145 MCG CAPS capsule Take 1 capsule (145 mcg total) by mouth daily before breakfast. 30 capsule 2   Sod Picosulfate-Mag Ox-Cit Acd (CLENPIQ) 10-3.5-12 MG-GM -GM/160ML SOLN Take 1 kit by  mouth as directed. 320 mL 0   No current facility-administered medications on file prior to visit.     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   Procedure: LAPAROSCOPIC ROUX-EN-Y GASTRIC BYPASS WITH UPPER ENDOSCOPY;  Surgeon: Greer Pickerel, MD;  Location: [facility];  Service: General;  Laterality: N/A;    FAMILY HISTORY: Family History  Problem Relation Age of Onset   Hyperlipidemia Mother    Mental illness Mother    Diabetes Mother        type 2 diabetes   Hyperlipidemia Father    Heart disease Father 44       MI   Hypertension Father    Diabetes Father    Thyroid cancer Father 61   Melanoma Father    Diabetes Maternal Grandmother    Kidney cancer Maternal Grandmother    Melanoma Maternal Grandmother    Ovarian cancer Paternal Grandmother        dx late 65s   Stroke Paternal Grandmother    Lung cancer Paternal Grandmother        dx 38s, hx smoking   Diabetes Paternal Grandfather    Breast cancer Maternal Aunt        diagonsed x2, first in 64s   Cancer Maternal Aunt 64       metastatic; lung, liver, panc, spleen   Colon cancer Neg Hx    Rectal cancer Neg Hx    Esophageal cancer Neg Hx     SOCIAL HISTORY:  reports that she has never smoked. She has never used smokeless tobacco. She reports that she does not drink alcohol and does not use drugs.  PERFORMANCE STATUS: The patient's performance status is 1 - Symptomatic but completely ambulatory  PHYSICAL EXAM: Most Recent Vital Signs: Blood pressure 125/63, pulse 84, temperature 98.9 F (37.2 C), temperature source Oral, resp. rate 18, height $RemoveBe'5\' 5"'cOvUpYvhy$  (1.651 m), weight 169 lb 8 oz (76.9 kg), SpO2 100  %. BP 125/63 (BP Location: Right Arm, Patient Position: Sitting)    Pulse 84    Temp 98.9 F (37.2 C) (Oral)    Resp 18    Ht $R'5\' 5"'tr$  (1.651 m)    Wt 169 lb 8 oz (76.9 kg)    SpO2 100%    BMI 28.21 kg/m   General Appearance:    Alert, cooperative,  no distress, appears stated age  Head:    Normocephalic, without obvious abnormality, atraumatic  Eyes:    PERRL, conjunctiva/corneas clear, EOM's intact, fundi    benign, both eyes        Throat:   Lips, mucosa, and tongue normal; teeth and gums normal  Neck:   Supple, symmetrical, trachea midline, no adenopathy;    thyroid:  no enlargement/tenderness/nodules; no carotid   bruit or JVD  Back:     Symmetric, no curvature, ROM normal, no CVA tenderness  Lungs:     Clear to auscultation bilaterally, respirations unlabored  Chest Wall:    No tenderness or deformity   Heart:    Regular rate and rhythm, S1 and S2 normal, no murmur, rub   or gallop     Abdomen:     Soft, non-tender, bowel sounds active all four quadrants,    no masses, no organomegaly        Extremities:   Extremities normal, atraumatic, no cyanosis or edema  Pulses:   2+ and symmetric all extremities  Skin:   Skin color, texture, turgor normal, no rashes or lesions  Lymph nodes:   Cervical, supraclavicular, and axillary nodes normal  Neurologic:   CNII-XII intact, normal strength, sensation and reflexes    throughout    LABORATORY DATA:  Results for orders placed or performed in visit on 10/31/21 (from the past 48 hour(s))  CMP ([place] only)     Status: Abnormal   Collection Time: 10/31/21  8:44 AM  Result Value Ref Range   Sodium 140 135 - 145 mmol/L   Potassium 4.7 3.5 - 5.1 mmol/L   Chloride 106 98 - 111 mmol/L   CO2 30 22 - 32 mmol/L   Glucose, Bld 95 70 - 99 mg/dL    Comment: Glucose reference range applies only to samples taken after fasting for at least 8 hours.   BUN 9 6 - 20 mg/dL   Creatinine 0.80 0.44 - 1.00 mg/dL   Calcium 8.6 (L) 8.9 - 10.3 mg/dL    Total Protein 6.5 6.5 - 8.1 g/dL   Albumin 3.9 3.5 - 5.0 g/dL   AST 23 15 - 41 U/L   ALT 22 0 - 44 U/L   Alkaline Phosphatase 57 38 - 126 U/L   Total Bilirubin 0.3 0.3 - 1.2 mg/dL   GFR, Estimated >60 >60 mL/min    Comment: (NOTE) Calculated using the CKD-EPI Creatinine Equation (2021)    Anion gap 4 (L) 5 - 15    Comment: Performed at [facility] at [facility], [address], [place], [place] [address]  Reticulocytes     Status: None   Collection Time: 10/31/21  8:44 AM  Result Value Ref Range   Retic Ct Pct 1.1 0.4 - 3.1 %   RBC. 4.40 3.87 - 5.11 MIL/uL   Retic Count, Absolute 50.2 19.0 - 186.0 K/uL   Immature Retic Fract 7.3 2.3 - 15.9 %    Comment: Performed at [facility] at [facility], [address] [place], [place] [address]  Save Smear [facility])     Status: None   Collection Time: 10/31/21  8:44 AM  Result Value Ref Range   Smear Review SMEAR STAINED AND AVAILABLE FOR REVIEW     Comment: Performed at [facility] at [facility], [address], [place], [place] [address]  CBC with Differential ([place]  Only)     Status: None   Collection Time: 10/31/21  8:44 AM  Result Value Ref Range   WBC Count 6.0 4.0 - 10.5 K/uL   RBC 4.38 3.87 - 5.11 MIL/uL   Hemoglobin 12.4 12.0 - 15.0 g/dL   HCT 38.5 36.0 - 46.0 %   MCV 87.9 80.0 - 100.0 fL   MCH 28.3 26.0 - 34.0 pg   MCHC 32.2 30.0 - 36.0 g/dL   RDW 14.6 11.5 - 15.5 %   Platelet Count 391 150 - 400 K/uL   nRBC 0.0 0.0 - 0.2 %   Neutrophils Relative % 48 %   Neutro Abs 2.9 1.7 - 7.7 K/uL   Lymphocytes Relative 34 %   Lymphs Abs 2.1 0.7 - 4.0 K/uL   Monocytes Relative 8 %   Monocytes Absolute 0.5 0.1 - 1.0 K/uL   Eosinophils Relative 8 %   Eosinophils Absolute 0.5 0.0 - 0.5 K/uL   Basophils Relative 1 %   Basophils Absolute 0.1 0.0 - 0.1 K/uL   Immature Granulocytes 1 %   Abs Immature Granulocytes 0.06 0.00 - 0.07 K/uL    Comment:  Performed at [facility] at [facility], [address] [place], [place] [address]      RADIOGRAPHY: No results found.     PATHOLOGY: None  ASSESSMENT/PLAN: Ms. Tuma is a very pleasant 46 yo caucasian female with iron deficiency anemia secondary to malabsorption. Iron studies are pending. She will continue taking her oral iron supplement once a day. We will change her over to IV iron if needed.  Follow-up in 8 weeks.   All questions were answered. The patient knows to call the clinic with any problems, questions or concerns. We can certainly see the patient much sooner if necessary.   Lottie Dawson, NP

## 2021-11-01 ENCOUNTER — Telehealth: Payer: Self-pay | Admitting: *Deleted

## 2021-11-01 LAB — IRON AND IRON BINDING CAPACITY (CC-WL,HP ONLY)
Iron: 81 ug/dL (ref 28–170)
Saturation Ratios: 17 % (ref 10.4–31.8)
TIBC: 473 ug/dL — ABNORMAL HIGH (ref 250–450)
UIBC: 392 ug/dL (ref 148–442)

## 2021-11-01 NOTE — Telephone Encounter (Signed)
Per 10/31/21 los - called and gave upcoming appointments - confirmed

## 2021-11-02 ENCOUNTER — Other Ambulatory Visit: Payer: Self-pay | Admitting: Family Medicine

## 2021-11-03 LAB — ERYTHROPOIETIN: Erythropoietin: 7.8 m[IU]/mL (ref 2.6–18.5)

## 2021-11-08 ENCOUNTER — Encounter: Payer: Self-pay | Admitting: *Deleted

## 2021-11-12 ENCOUNTER — Other Ambulatory Visit: Payer: Self-pay | Admitting: Family Medicine

## 2021-11-12 DIAGNOSIS — F339 Major depressive disorder, recurrent, unspecified: Secondary | ICD-10-CM

## 2021-11-13 ENCOUNTER — Telehealth: Payer: BC Managed Care – PPO | Admitting: Physician Assistant

## 2021-11-13 DIAGNOSIS — J019 Acute sinusitis, unspecified: Secondary | ICD-10-CM | POA: Diagnosis not present

## 2021-11-13 DIAGNOSIS — B9689 Other specified bacterial agents as the cause of diseases classified elsewhere: Secondary | ICD-10-CM

## 2021-11-13 MED ORDER — AMOXICILLIN-POT CLAVULANATE 875-125 MG PO TABS: 1.0000 | ORAL_TABLET | Freq: Two times a day (BID) | ORAL | 0 refills | Status: DC

## 2021-11-13 NOTE — Progress Notes (Signed)

## 2021-11-21 ENCOUNTER — Ambulatory Visit (AMBULATORY_SURGERY_CENTER): Payer: BC Managed Care – PPO | Admitting: Gastroenterology

## 2021-11-21 ENCOUNTER — Other Ambulatory Visit: Payer: Self-pay

## 2021-11-21 ENCOUNTER — Encounter: Payer: Self-pay | Admitting: Gastroenterology

## 2021-11-21 VITALS — BP 134/74 | HR 68 | Temp 98.3°F | Resp 10 | Ht 64.0 in | Wt 156.0 lb

## 2021-11-21 DIAGNOSIS — K2960 Other gastritis without bleeding: Secondary | ICD-10-CM | POA: Diagnosis not present

## 2021-11-21 DIAGNOSIS — D509 Iron deficiency anemia, unspecified: Secondary | ICD-10-CM | POA: Diagnosis not present

## 2021-11-21 DIAGNOSIS — Z9884 Bariatric surgery status: Secondary | ICD-10-CM

## 2021-11-21 DIAGNOSIS — K2950 Unspecified chronic gastritis without bleeding: Secondary | ICD-10-CM | POA: Diagnosis not present

## 2021-11-21 MED ORDER — SODIUM CHLORIDE 0.9 % IV SOLN: 500.0000 mL | Freq: Once | INTRAVENOUS | Status: DC

## 2021-11-21 NOTE — Progress Notes (Signed)
Vital signs checked by:CW ? ?The medical and surgical history was reviewed and verified with the patient. ? ?

## 2021-11-21 NOTE — Patient Instructions (Signed)
YOU HAD AN ENDOSCOPIC PROCEDURE TODAY AT THE [facility]:   Refer to the procedure report that was given to you for any specific questions about what was found during the examination.  If the procedure report does not answer your questions, please call your gastroenterologist to clarify.  If you requested that your care partner not be given the details of your procedure findings, then the procedure report has been included in a sealed envelope for you to review at your convenience later.  YOU SHOULD EXPECT: Some feelings of bloating in the abdomen. Passage of more gas than usual.  Walking can help get rid of the air that was put into your GI tract during the procedure and reduce the bloating. If you had a lower endoscopy (such as a colonoscopy or flexible sigmoidoscopy) you may notice spotting of blood in your stool or on the toilet paper. If you underwent a bowel prep for your procedure, you may not have a normal bowel movement for a few days.  Please Note:  You might notice some irritation and congestion in your nose or some drainage.  This is from the oxygen used during your procedure.  There is no need for concern and it should clear up in a day or so.  SYMPTOMS TO REPORT IMMEDIATELY:    Following upper endoscopy (EGD)  Vomiting of blood or coffee ground material  New chest pain or pain under the shoulder blades  Painful or persistently difficult swallowing  New shortness of breath  Fever of 100F or higher  Black, tarry-looking stools  For urgent or emergent issues, a gastroenterologist can be reached at any hour by calling [phone]. Do not use MyChart messaging for urgent concerns.    DIET:  We do recommend a small meal at first, but then you may proceed to your regular diet.  Drink plenty of fluids but you should avoid alcoholic beverages for 24 hours.  ACTIVITY:  You should plan to take it easy for the rest of today and you should NOT DRIVE or use heavy machinery  until tomorrow (because of the sedation medicines used during the test).    FOLLOW UP: Our staff will call the number listed on your records 48-72 hours following your procedure to check on you and address any questions or concerns that you may have regarding the information given to you following your procedure. If we do not reach you, we will leave a message.  We will attempt to reach you two times.  During this call, we will ask if you have developed any symptoms of COVID 19. If you develop any symptoms (ie: fever, flu-like symptoms, shortness of breath, cough etc.) before then, please call [phone].  If you test positive for Covid 19 in the 2 weeks post procedure, please call and report this information to us.    If any biopsies were taken you will be contacted by phone or by letter within the next 1-3 weeks.  Please call us at [phone] if you have not heard about the biopsies in 3 weeks.    SIGNATURES/CONFIDENTIALITY: You and/or your care partner have signed paperwork which will be entered into your electronic medical record.  These signatures attest to the fact that that the information above on your After Visit Summary has been reviewed and is understood.  Full responsibility of the confidentiality of this discharge information lies with you and/or your care-partner. 

## 2021-11-21 NOTE — Op Note (Signed)
[facility] ?Patient Name: Catherine Washington ?Procedure Date: 11/21/2021 10:00 AM ?MRN: [id number] ?Endoscopist: Gerrit Heck , MD ?Age: 46 ?Referring MD:  ?Date of Birth: 10/02/1975 ?Gender: Female ?Account #: [id number] ?Procedure:                Upper GI endoscopy ?Indications:              Iron deficiency anemia ?Medicines:                Monitored Anesthesia Care ?Procedure:                Pre-Anesthesia Assessment: ?                          - Prior to the procedure, a History and Physical  ?                          was performed, and patient medications and  ?                          allergies were reviewed. The patient's tolerance of  ?                          previous anesthesia was also reviewed. The risks  ?                          and benefits of the procedure and the sedation  ?                          options and risks were discussed with the patient.  ?                          All questions were answered, and informed consent  ?                          was obtained. Prior Anticoagulants: The patient has  ?                          taken no previous anticoagulant or antiplatelet  ?                          agents. ASA Grade Assessment: II - A patient with  ?                          mild systemic disease. After reviewing the risks  ?                          and benefits, the patient was deemed in  ?                          satisfactory condition to undergo the procedure. ?                          After obtaining informed consent, the endoscope was  ?  passed under direct vision. Throughout the  ?                          procedure, the patient's blood pressure, pulse, and  ?                          oxygen saturations were monitored continuously. The  ?                          Endoscope was introduced through the mouth, and  ?                          advanced to the jejunum. The upper GI endoscopy was  ?                          accomplished without  difficulty. The patient  ?                          tolerated the procedure well. ?Scope In: ?Scope Out: ?Findings:                 The examined esophagus was normal. ?                          Evidence of a Roux-en-Y gastrojejunostomy was  ?                          found. The gastrojejunal anastomosis was  ?                          characterized by 2 visible staples. These were  ?                          removed with cold forceps. The anastamosis was  ?                          otherwise normal appearing. No marginal ulcers.  ?                          This was easily traversed. The gastric pouch mucosa  ?                          was normal appearing. Biopsies were taken with a  ?                          cold forceps for Helicobacter pylori testing. The  ?                          pouch-to-jejunum limb was characterized by healthy  ?                          appearing mucosa, as below. The blind loop was 9 cm  ?                          in  length, with otherwise normal appearing mucosa. ?                          The examined small bowel was normal. Biopsies were  ?                          taken with a cold forceps for histology. Estimated  ?                          blood loss was minimal. ?Complications:            No immediate complications. ?Estimated Blood Loss:     Estimated blood loss was minimal. ?Impression:               - Normal esophagus. ?                          - Roux-en-Y gastrojejunostomy with gastrojejunal  ?                          anastomosis was healthy and normal appearing. 2  ?                          visible staples were removed. There were no  ?                          associated marginal ulcers. ?                          - Normal appearing gastric pouch mucosa. Biopsied. ?                          - The blind loop was 9 cm in length. ?                          - Normal examined jejunum. Biopsied. ?Recommendation:           - Patient has a contact number available for  ?                           emergencies. The signs and symptoms of potential  ?                          delayed complications were discussed with the  ?                          patient. Return to normal activities tomorrow.  ?                          Written discharge instructions were provided to the  ?                          patient. ?                          - Resume previous diet. ?                          -  Continue present medications. ?                          - Await pathology results. ?Gerrit Heck, MD ?11/21/2021 10:28:27 AM ?

## 2021-11-21 NOTE — Progress Notes (Signed)
? ?GASTROENTEROLOGY PROCEDURE H&P NOTE  ? ?Primary Care Physician: ?Eulas Post, MD ? ? ? ?Reason for Procedure:   Iron def anemia ? ?Plan:    EGD ? ?Patient is appropriate for endoscopic procedure(s) in the ambulatory ([place]) setting. ? ?The nature of the procedure, as well as the risks, benefits, and alternatives were carefully and thoroughly reviewed with the patient. Ample time for discussion and questions allowed. The patient understood, was satisfied, and agreed to proceed.  ? ? ? ?HPI: ?Catherine Washington is a 46 y.o. female who presents for EGD for evaluation of IDA.  ? ?Past Medical History:  ?Diagnosis Date  ? Allergy   ? Anxiety   ? Chicken pox   ? Depression   ? Family history of adverse reaction to anesthesia   ? mother gets very nausaus  ? Family history of breast cancer   ? Family history of breast cancer   ? Family history of kidney cancer   ? Family history of melanoma   ? Family history of ovarian cancer   ? Family history of thyroid cancer   ? Headache   ? occasionally  ? Hyperlipidemia   ? Ulcerative colitis ([place])   ? UTI (urinary tract infection)   ? ? ?Past Surgical History:  ?Procedure Laterality Date  ? ABDOMINAL HYSTERECTOMY  2006  ? dysfunctional bleeding  ? BREATH TEK H PYLORI N/A 12/06/2014  ? Procedure: BREATH TEK H PYLORI;  Surgeon: Greer Pickerel, MD;  Location: [facility];  Service: General;  Laterality: N/A;  ? childbirth  1995, 2003  ? CHOLECYSTECTOMY    ? 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 Procedure: LAPAROSCOPIC ROUX-EN-Y GASTRIC BYPASS WITH UPPER ENDOSCOPY;  Surgeon: Greer Pickerel, MD;  Location: [facility];  Service: General;  Laterality: N/A;  ? ? ?Prior to Admission medications   ?Medication Sig Start Date End Date Taking? Authorizing Provider  ?amoxicillin-clavulanate (AUGMENTIN) 875-125 MG tablet Take 1 tablet by mouth 2 (two) times daily. 11/13/21  Yes Mar Daring, PA-C  ?buPROPion (WELLBUTRIN) 100 MG tablet TAKE 1 TABLET BY MOUTH 3 TIMES DAILY. 11/13/21  Yes Burchette,  Alinda Sierras, MD  ?Ferrous Sulfate (IRON) 325 (65 Fe) MG TABS Take by mouth.   Yes [provider]  ?fexofenadine (ALLEGRA) 180 MG tablet Take 180 mg by mouth daily.   Yes [provider]  ?Multiple Vitamin (MULTIVITAMIN) tablet Take 1 tablet by mouth daily.   Yes [provider]  ?Polyethylene Glycol 3350 (MIRALAX PO) Take by mouth. Taking everyday   Yes [provider]  ?traZODone (DESYREL) 50 MG tablet TAKE 1 TABLET BY MOUTH AT NIGHT AS NEEDED FOR INSOMNIA. 11/02/21  Yes Burchette, Alinda Sierras, MD  ?venlafaxine XR (EFFEXOR-XR) 37.5 MG 24 hr capsule TAKE 1 CAPSULE BY MOUTH DAILY WITH BREAKFAST. 03/22/21  Yes Burchette, Alinda Sierras, MD  ?vitamin B-12 (CYANOCOBALAMIN) 500 MCG tablet Take 500 mcg by mouth daily.   Yes [provider]  ?vitamin C (ASCORBIC ACID) 500 MG tablet Take 500 mg by mouth daily.   Yes [provider]  ? ? ?Current Outpatient Medications  ?Medication Sig Dispense Refill  ? amoxicillin-clavulanate (AUGMENTIN) 875-125 MG tablet Take 1 tablet by mouth 2 (two) times daily. 14 tablet 0  ? buPROPion (WELLBUTRIN) 100 MG tablet TAKE 1 TABLET BY MOUTH 3 TIMES DAILY. 270 tablet 0  ? Ferrous Sulfate (IRON) 325 (65 Fe) MG TABS Take by mouth.    ? fexofenadine (ALLEGRA) 180 MG tablet Take 180 mg by mouth  daily.    ? Multiple Vitamin (MULTIVITAMIN) tablet Take 1 tablet by mouth daily.    ? Polyethylene Glycol 3350 (MIRALAX PO) Take by mouth. Taking everyday    ? traZODone (DESYREL) 50 MG tablet TAKE 1 TABLET BY MOUTH AT NIGHT AS NEEDED FOR INSOMNIA. 30 tablet 1  ? venlafaxine XR (EFFEXOR-XR) 37.5 MG 24 hr capsule TAKE 1 CAPSULE BY MOUTH DAILY WITH BREAKFAST. 90 capsule 3  ? vitamin B-12 (CYANOCOBALAMIN) 500 MCG tablet Take 500 mcg by mouth daily.    ? vitamin C (ASCORBIC ACID) 500 MG tablet Take 500 mg by mouth daily.    ? ?Current Facility-Administered Medications  ?Medication Dose Route Frequency Provider Last Rate Last Admin  ? 0.9 %  sodium chloride infusion  500 mL  Intravenous Once Carmina Walle V, DO      ? ? ?Allergies as of 11/21/2021 - Review Complete 11/21/2021  ?Allergen Reaction Noted  ? Adhesive [tape] Other (See Comments) 02/23/2015  ? Nitrofurantoin monohyd macro  04/18/2011  ? ? ?Family History  ?Problem Relation Age of Onset  ? Hyperlipidemia Mother   ? Mental illness Mother   ? Diabetes Mother   ?     type 2 diabetes  ? Hyperlipidemia Father   ? Heart disease Father 20  ?     MI  ? Hypertension Father   ? Diabetes Father   ? Thyroid cancer Father 59  ? Melanoma Father   ? Diabetes Maternal Grandmother   ? Kidney cancer Maternal Grandmother   ? Melanoma Maternal Grandmother   ? Ovarian cancer Paternal Grandmother   ?     dx late 2s  ? Stroke Paternal Grandmother   ? Lung cancer Paternal Grandmother   ?     dx 73s, hx smoking  ? Diabetes Paternal Grandfather   ? Breast cancer Maternal Aunt   ?     diagonsed x2, first in 82s  ? Cancer Maternal Aunt 64  ?     metastatic; lung, liver, panc, spleen  ? Colon cancer Neg Hx   ? Rectal cancer Neg Hx   ? Esophageal cancer Neg Hx   ? ? ?Social History  ? ?Socioeconomic History  ? Marital status: Married  ?  Spouse name: Not on file  ? Number of children: 2  ? Years of education: Not on file  ? Highest education level: Not on file  ?Occupational History  ? Not on file  ?Tobacco Use  ? Smoking status: Never  ? Smokeless tobacco: Never  ?Vaping Use  ? Vaping Use: Never used  ?Substance and Sexual Activity  ? Alcohol use: No  ? Drug use: No  ? Sexual activity: Not on file  ?Other Topics Concern  ? Not on file  ?Social History Narrative  ? Has 2 children  10 and 15 non smoker   ? ?Social Determinants of Health  ? ?Financial Resource Strain: Not on file  ?Food Insecurity: Not on file  ?Transportation Needs: Not on file  ?Physical Activity: Not on file  ?Stress: Not on file  ?Social Connections: Not on file  ?Intimate Partner Violence: Not on file  ? ? ?Physical Exam: ?Vital signs in last 24 hours: ?'@BP'$  118/75   Pulse 83    Temp 98.3 ?F (36.8 ?C) (Temporal)   Ht '5\' 4"'$  (1.626 m)   Wt 156 lb (70.8 kg)   SpO2 98%   BMI 26.78 kg/m?  ?GEN: NAD ?EYE: Sclerae anicteric ?ENT: MMM ?CV: Non-tachycardic ?Pulm: CTA  b/l ?GI: Soft, NT/ND ?NEURO:  Alert & Oriented x 3 ? ? ?Gerrit Heck, DO ?[facility] ? ? ?11/21/2021 10:02 AM ? ?

## 2021-11-21 NOTE — Progress Notes (Signed)
Report to PACU, RN, vss, BBS= Clear.  

## 2021-11-21 NOTE — Progress Notes (Signed)
Called to room to assist during endoscopic procedure.  Patient ID and intended procedure confirmed with present staff. Received instructions for my participation in the procedure from the performing physician.  

## 2021-11-23 ENCOUNTER — Telehealth: Payer: Self-pay | Admitting: *Deleted

## 2021-11-23 NOTE — Telephone Encounter (Signed)
?  Follow up Call- ? ?Call back number 11/21/2021 08/09/2021  ?Post procedure Call Back phone  # [phone]  ?Permission to leave phone message Yes Yes  ?Some recent data might be hidden  ?  ? ?Patient questions: ? ?Do you have a fever, pain , or abdominal swelling? No. ?Pain Score  0 * ? ?Have you tolerated food without any problems? Yes.   ? ?Have you been able to return to your normal activities? Yes.   ? ?Do you have any questions about your discharge instructions: ?Diet   No. ?Medications  No. ?Follow up visit  No. ? ?Do you have questions or concerns about your Care? No. ? ?Actions: ?* If pain score is 4 or above: ?No action needed, pain <4. ? ? ?

## 2021-11-29 ENCOUNTER — Encounter: Payer: Self-pay | Admitting: Gastroenterology

## 2021-12-27 ENCOUNTER — Inpatient Hospital Stay: Payer: BC Managed Care – PPO | Attending: Hematology & Oncology

## 2021-12-27 ENCOUNTER — Inpatient Hospital Stay: Payer: BC Managed Care – PPO | Admitting: Family

## 2021-12-27 ENCOUNTER — Encounter: Payer: Self-pay | Admitting: Family

## 2021-12-27 VITALS — BP 119/59 | HR 89 | Temp 98.6°F | Resp 17 | Wt 170.0 lb

## 2021-12-27 DIAGNOSIS — Z79899 Other long term (current) drug therapy: Secondary | ICD-10-CM | POA: Insufficient documentation

## 2021-12-27 DIAGNOSIS — Z881 Allergy status to other antibiotic agents status: Secondary | ICD-10-CM | POA: Diagnosis not present

## 2021-12-27 DIAGNOSIS — R5383 Other fatigue: Secondary | ICD-10-CM | POA: Insufficient documentation

## 2021-12-27 DIAGNOSIS — K909 Intestinal malabsorption, unspecified: Secondary | ICD-10-CM | POA: Insufficient documentation

## 2021-12-27 DIAGNOSIS — Z9884 Bariatric surgery status: Secondary | ICD-10-CM | POA: Insufficient documentation

## 2021-12-27 DIAGNOSIS — D508 Other iron deficiency anemias: Secondary | ICD-10-CM | POA: Diagnosis present

## 2021-12-27 DIAGNOSIS — Z888 Allergy status to other drugs, medicaments and biological substances status: Secondary | ICD-10-CM | POA: Insufficient documentation

## 2021-12-27 LAB — RETICULOCYTES
Immature Retic Fract: 6.8 % (ref 2.3–15.9)
RBC.: 4.18 MIL/uL (ref 3.87–5.11)
Retic Count, Absolute: 61 10*3/uL (ref 19.0–186.0)
Retic Ct Pct: 1.5 % (ref 0.4–3.1)

## 2021-12-27 LAB — IRON AND IRON BINDING CAPACITY (CC-WL,HP ONLY)
Iron: 111 ug/dL (ref 28–170)
Saturation Ratios: 25 % (ref 10.4–31.8)
TIBC: 445 ug/dL (ref 250–450)
UIBC: 334 ug/dL (ref 148–442)

## 2021-12-27 LAB — CBC WITH DIFFERENTIAL (CANCER CENTER ONLY)
Abs Immature Granulocytes: 0.06 10*3/uL (ref 0.00–0.07)
Basophils Absolute: 0.1 10*3/uL (ref 0.0–0.1)
Basophils Relative: 1 %
Eosinophils Absolute: 0.9 10*3/uL — ABNORMAL HIGH (ref 0.0–0.5)
Eosinophils Relative: 15 %
HCT: 38.3 % (ref 36.0–46.0)
Hemoglobin: 12.6 g/dL (ref 12.0–15.0)
Immature Granulocytes: 1 %
Lymphocytes Relative: 33 %
Lymphs Abs: 1.9 10*3/uL (ref 0.7–4.0)
MCH: 29.7 pg (ref 26.0–34.0)
MCHC: 32.9 g/dL (ref 30.0–36.0)
MCV: 90.3 fL (ref 80.0–100.0)
Monocytes Absolute: 0.5 10*3/uL (ref 0.1–1.0)
Monocytes Relative: 8 %
Neutro Abs: 2.4 10*3/uL (ref 1.7–7.7)
Neutrophils Relative %: 42 %
Platelet Count: 388 10*3/uL (ref 150–400)
RBC: 4.24 MIL/uL (ref 3.87–5.11)
RDW: 13.4 % (ref 11.5–15.5)
WBC Count: 5.8 10*3/uL (ref 4.0–10.5)
nRBC: 0 % (ref 0.0–0.2)

## 2021-12-27 LAB — FERRITIN: Ferritin: 31 ng/mL (ref 11–307)

## 2021-12-27 NOTE — Progress Notes (Signed)
?Hematology and Oncology Follow Up Visit ? ?[name] ?[id number] ?01-Sep-1976 46 y.o. ?12/27/2021 ? ? ?Principle Diagnosis:  ?Iron deficiency anemia secondary to malabsorption, Rou-en-y gastric bypass in 2016 ? ?Current Therapy:   ?IV iron as indicated  ?  ?Interim History:  Ms. Catherine Washington is here today for follow-up. She is doing well but notes fatigue by early afternoon each day.  ?No blood loss noted. No abnormal bruising, no petechiae.  ?She continues to take her multivitamin, B 12 , vitamin D and iron supplements daily.  ?She has been eating well and staying hydrated throughout the day. Weight is 170 lbs.  ?No fever, chills, n/v, cough, rash, dizziness, SOB, chest pain, palpitations, abdominal pain or changes in bowel or bladder habits.  ?No swelling, tenderness, numbness or tingling in her extremities at this time.  ?No falls or syncope to report.  ? ?ECOG Performance Status: 1 - Symptomatic but completely ambulatory ? ?Medications:  ?Allergies as of 12/27/2021   ? ?   Reactions  ? Adhesive [tape] Other (See Comments)  ? Blisters  ? Nitrofurantoin Monohyd Macro   ? GI upset  ? ?  ? ?  ?Medication List  ?  ? ?  ? Accurate as of December 27, 2021  8:40 AM. If you have any questions, ask your nurse or doctor.  ?  ?  ? ?  ? ?amoxicillin-clavulanate 875-125 MG tablet ?Commonly known as: AUGMENTIN ?Take 1 tablet by mouth 2 (two) times daily. ?  ?buPROPion 100 MG tablet ?Commonly known as: WELLBUTRIN ?TAKE 1 TABLET BY MOUTH 3 TIMES DAILY. ?  ?fexofenadine 180 MG tablet ?Commonly known as: ALLEGRA ?Take 180 mg by mouth daily. ?  ?Iron 325 (65 Fe) MG Tabs ?Take by mouth. ?  ?MIRALAX PO ?Take by mouth. Taking everyday ?  ?multivitamin tablet ?Take 1 tablet by mouth daily. ?  ?traZODone 50 MG tablet ?Commonly known as: DESYREL ?TAKE 1 TABLET BY MOUTH AT NIGHT AS NEEDED FOR INSOMNIA. ?  ?venlafaxine XR 37.5 MG 24 hr capsule ?Commonly known as: EFFEXOR-XR ?TAKE 1 CAPSULE BY MOUTH DAILY WITH BREAKFAST. ?  ?vitamin B-12 500 MCG  tablet ?Commonly known as: CYANOCOBALAMIN ?Take 500 mcg by mouth daily. ?  ?vitamin C 500 MG tablet ?Commonly known as: ASCORBIC ACID ?Take 500 mg by mouth daily. ?  ? ?  ? ? ?Allergies:  ?Allergies  ?Allergen Reactions  ? Adhesive [Tape] Other (See Comments)  ?  Blisters  ? Nitrofurantoin Monohyd Macro   ?  GI upset  ? ? ?Past Medical History, Surgical history, Social history, and Family History were reviewed and updated. ? ?Review of Systems: ?All other 10 point review of systems is negative.  ? ?Physical Exam: ? weight is 170 lb (77.1 kg). Her oral temperature is 98.6 ?F (37 ?C). Her blood pressure is 119/59 (abnormal) and her pulse is 89. Her respiration is 17 and oxygen saturation is 100%.  ? ?Wt Readings from Last 3 Encounters:  ?12/27/21 170 lb (77.1 kg)  ?11/21/21 156 lb (70.8 kg)  ?10/31/21 169 lb 8 oz (76.9 kg)  ? ? ?Ocular: Sclerae unicteric, pupils equal, round and reactive to light ?Ear-nose-throat: Oropharynx clear, dentition fair ?Lymphatic: No cervical or supraclavicular adenopathy ?Lungs no rales or rhonchi, good excursion bilaterally ?Heart regular rate and rhythm, no murmur appreciated ?Abd soft, nontender, positive bowel sounds ?MSK no focal spinal tenderness, no joint edema ?Neuro: non-focal, well-oriented, appropriate affect ?Breasts: Deferred  ? ?Lab Results  ?Component Value Date  ? WBC  5.8 12/27/2021  ? HGB 12.6 12/27/2021  ? HCT 38.3 12/27/2021  ? MCV 90.3 12/27/2021  ? PLT 388 12/27/2021  ? ?Lab Results  ?Component Value Date  ? FERRITIN 19 10/31/2021  ? IRON 81 10/31/2021  ? TIBC 473 (H) 10/31/2021  ? UIBC 392 10/31/2021  ? IRONPCTSAT 17 10/31/2021  ? ?Lab Results  ?Component Value Date  ? RETICCTPCT 1.5 12/27/2021  ? RBC 4.18 12/27/2021  ? RBC 4.24 12/27/2021  ? ?No results found for: KPAFRELGTCHN, LAMBDASER, KAPLAMBRATIO ?No results found for: IGGSERUM, IGA, IGMSERUM ?No results found for: TOTALPROTELP, ALBUMINELP, A1GS, A2GS, BETS, BETA2SER, GAMS, MSPIKE, SPEI ?  Chemistry   ?    ?Component Value Date/Time  ? NA 140 10/31/2021 0844  ? K 4.7 10/31/2021 0844  ? CL 106 10/31/2021 0844  ? CO2 30 10/31/2021 0844  ? BUN 9 10/31/2021 0844  ? CREATININE 0.80 10/31/2021 0844  ?    ?Component Value Date/Time  ? CALCIUM 8.6 (L) 10/31/2021 0844  ? ALKPHOS 57 10/31/2021 0844  ? AST 23 10/31/2021 0844  ? ALT 22 10/31/2021 0844  ? BILITOT 0.3 10/31/2021 0844  ?  ? ? ? ?Impression and Plan: Ms. Catherine Washington is a very pleasant 46 yo caucasian female with iron deficiency anemia secondary to malabsorption. ?Iron studies are pending.  ?Follow-up in 3 months.  ? ?Lottie Dawson, NP ?4/12/20238:40 AM ? ?

## 2022-01-18 ENCOUNTER — Other Ambulatory Visit: Payer: Self-pay | Admitting: Family Medicine

## 2022-02-11 ENCOUNTER — Other Ambulatory Visit: Payer: Self-pay | Admitting: Family Medicine

## 2022-02-11 DIAGNOSIS — F339 Major depressive disorder, recurrent, unspecified: Secondary | ICD-10-CM

## 2022-03-13 ENCOUNTER — Other Ambulatory Visit: Payer: Self-pay | Admitting: Family Medicine

## 2022-03-13 DIAGNOSIS — F339 Major depressive disorder, recurrent, unspecified: Secondary | ICD-10-CM

## 2022-03-21 ENCOUNTER — Other Ambulatory Visit: Payer: Self-pay | Admitting: Family Medicine

## 2022-03-21 DIAGNOSIS — F339 Major depressive disorder, recurrent, unspecified: Secondary | ICD-10-CM

## 2022-03-28 ENCOUNTER — Inpatient Hospital Stay: Payer: BC Managed Care – PPO

## 2022-03-28 ENCOUNTER — Inpatient Hospital Stay: Payer: BC Managed Care – PPO | Admitting: Family

## 2022-04-06 ENCOUNTER — Other Ambulatory Visit: Payer: Self-pay | Admitting: Family Medicine

## 2022-04-06 DIAGNOSIS — F339 Major depressive disorder, recurrent, unspecified: Secondary | ICD-10-CM

## 2022-04-11 ENCOUNTER — Other Ambulatory Visit: Payer: Self-pay | Admitting: Family Medicine

## 2022-04-11 DIAGNOSIS — F339 Major depressive disorder, recurrent, unspecified: Secondary | ICD-10-CM

## 2022-04-12 ENCOUNTER — Telehealth: Payer: Self-pay | Admitting: Family Medicine

## 2022-04-12 DIAGNOSIS — F339 Major depressive disorder, recurrent, unspecified: Secondary | ICD-10-CM

## 2022-04-12 NOTE — Telephone Encounter (Signed)
Pt requesting refill buPROPion (WELLBUTRIN) 100 MG tablet   venlafaxine XR (EFFEXOR-XR) 37.5 MG 24 hr capsule     [id number] [place] [facility] [address] Phone:  [phone] Fax:  [phone]   Ov scheduled for 05/08/22

## 2022-04-13 ENCOUNTER — Encounter: Payer: Self-pay | Admitting: Family

## 2022-04-13 ENCOUNTER — Other Ambulatory Visit: Payer: Self-pay

## 2022-04-13 ENCOUNTER — Inpatient Hospital Stay: Payer: BC Managed Care – PPO | Admitting: Family

## 2022-04-13 ENCOUNTER — Telehealth: Payer: Self-pay | Admitting: *Deleted

## 2022-04-13 ENCOUNTER — Inpatient Hospital Stay: Payer: BC Managed Care – PPO | Attending: Hematology & Oncology

## 2022-04-13 VITALS — BP 122/64 | HR 75 | Temp 98.2°F | Resp 18 | Ht 64.0 in | Wt 175.0 lb

## 2022-04-13 DIAGNOSIS — K909 Intestinal malabsorption, unspecified: Secondary | ICD-10-CM | POA: Insufficient documentation

## 2022-04-13 DIAGNOSIS — R42 Dizziness and giddiness: Secondary | ICD-10-CM | POA: Diagnosis not present

## 2022-04-13 DIAGNOSIS — R519 Headache, unspecified: Secondary | ICD-10-CM | POA: Diagnosis not present

## 2022-04-13 DIAGNOSIS — Z881 Allergy status to other antibiotic agents status: Secondary | ICD-10-CM | POA: Insufficient documentation

## 2022-04-13 DIAGNOSIS — R200 Anesthesia of skin: Secondary | ICD-10-CM | POA: Diagnosis not present

## 2022-04-13 DIAGNOSIS — Z888 Allergy status to other drugs, medicaments and biological substances status: Secondary | ICD-10-CM | POA: Diagnosis not present

## 2022-04-13 DIAGNOSIS — Z9884 Bariatric surgery status: Secondary | ICD-10-CM | POA: Diagnosis not present

## 2022-04-13 DIAGNOSIS — R5383 Other fatigue: Secondary | ICD-10-CM | POA: Insufficient documentation

## 2022-04-13 DIAGNOSIS — R202 Paresthesia of skin: Secondary | ICD-10-CM | POA: Diagnosis not present

## 2022-04-13 DIAGNOSIS — Z79899 Other long term (current) drug therapy: Secondary | ICD-10-CM | POA: Insufficient documentation

## 2022-04-13 DIAGNOSIS — D508 Other iron deficiency anemias: Secondary | ICD-10-CM

## 2022-04-13 LAB — CBC WITH DIFFERENTIAL (CANCER CENTER ONLY)
Abs Immature Granulocytes: 0.05 10*3/uL (ref 0.00–0.07)
Basophils Absolute: 0.1 10*3/uL (ref 0.0–0.1)
Basophils Relative: 1 %
Eosinophils Absolute: 0.6 10*3/uL — ABNORMAL HIGH (ref 0.0–0.5)
Eosinophils Relative: 11 %
HCT: 39.7 % (ref 36.0–46.0)
Hemoglobin: 12.6 g/dL (ref 12.0–15.0)
Immature Granulocytes: 1 %
Lymphocytes Relative: 40 %
Lymphs Abs: 2.1 10*3/uL (ref 0.7–4.0)
MCH: 28.5 pg (ref 26.0–34.0)
MCHC: 31.7 g/dL (ref 30.0–36.0)
MCV: 89.8 fL (ref 80.0–100.0)
Monocytes Absolute: 0.5 10*3/uL (ref 0.1–1.0)
Monocytes Relative: 9 %
Neutro Abs: 2 10*3/uL (ref 1.7–7.7)
Neutrophils Relative %: 38 %
Platelet Count: 373 10*3/uL (ref 150–400)
RBC: 4.42 MIL/uL (ref 3.87–5.11)
RDW: 13.1 % (ref 11.5–15.5)
WBC Count: 5.2 10*3/uL (ref 4.0–10.5)
nRBC: 0 % (ref 0.0–0.2)

## 2022-04-13 LAB — IRON AND IRON BINDING CAPACITY (CC-WL,HP ONLY)
Iron: 101 ug/dL (ref 28–170)
Saturation Ratios: 23 % (ref 10.4–31.8)
TIBC: 438 ug/dL (ref 250–450)
UIBC: 337 ug/dL (ref 148–442)

## 2022-04-13 LAB — FERRITIN: Ferritin: 16 ng/mL (ref 11–307)

## 2022-04-13 LAB — RETICULOCYTES
Immature Retic Fract: 5.7 % (ref 2.3–15.9)
RBC.: 4.39 MIL/uL (ref 3.87–5.11)
Retic Count, Absolute: 50.5 10*3/uL (ref 19.0–186.0)
Retic Ct Pct: 1.2 % (ref 0.4–3.1)

## 2022-04-13 MED ORDER — VENLAFAXINE HCL ER 37.5 MG PO CP24: 37.5000 mg | ORAL_CAPSULE | Freq: Every day | ORAL | 0 refills | Status: DC

## 2022-04-13 MED ORDER — BUPROPION HCL 100 MG PO TABS: 100.0000 mg | ORAL_TABLET | Freq: Three times a day (TID) | ORAL | 0 refills | Status: DC

## 2022-04-13 NOTE — Telephone Encounter (Signed)
Per 04/13/22 los - called and gave upcoming appointments - confirmed

## 2022-04-13 NOTE — Telephone Encounter (Signed)
Rx sent 

## 2022-04-13 NOTE — Progress Notes (Signed)
Hematology and Oncology Follow Up Visit  Catherine Washington [id number] [birth year] 46 y.o. 04/13/2022   Principle Diagnosis:  Iron deficiency anemia secondary to malabsorption, Rou-en-y gastric bypass in 2016   Current Therapy:        IV iron as indicated    Interim History:  Catherine Washington is here today for follow-up. She is symptomatic with fatigue as well as occasional dizziness and headaches noted over the last month.  No blood loss noted. No abnormal bruising, no petechiae.  Appetite and hydration are good. Her weight is stable at 175 lbs.  No fever, chills, n/v, cough, rash, SOB, chest pain, palpitations, abdominal pain or changes in bowel or bladder habits.  No swelling, tenderness in her extremities.  She has occasional positional numbness and tingling in her hands that resolves once she moves.  No falls or syncope reported.   ECOG Performance Status: 1 - Symptomatic but completely ambulatory  Medications:  Allergies as of 04/13/2022       Reactions   Adhesive [tape] Other (See Comments)   Blisters   Nitrofurantoin Monohyd Macro Nausea And Vomiting        Medication List        Accurate as of April 13, 2022  9:16 AM. If you have any questions, ask your nurse or doctor.          STOP taking these medications    amoxicillin-clavulanate 875-125 MG tablet Commonly known as: AUGMENTIN Stopped by: Lottie Dawson, NP       TAKE these medications    ascorbic acid 500 MG tablet Commonly known as: VITAMIN C Take 500 mg by mouth daily.   buPROPion 100 MG tablet Commonly known as: WELLBUTRIN Take 1 tablet (100 mg total) by mouth 3 (three) times daily.   fexofenadine 180 MG tablet Commonly known as: ALLEGRA Take 180 mg by mouth daily.   Iron 325 (65 Fe) MG Tabs Take by mouth.   MIRALAX PO Take by mouth. Taking everyday   multivitamin tablet Take 1 tablet by mouth daily.   traZODone 50 MG tablet Commonly known as: DESYREL TAKE 1 TABLET BY MOUTH AT NIGHT AS  NEEDED FOR INSOMNIA.   venlafaxine XR 37.5 MG 24 hr capsule Commonly known as: EFFEXOR-XR Take 1 capsule (37.5 mg total) by mouth daily with breakfast.   vitamin B-12 500 MCG tablet Commonly known as: CYANOCOBALAMIN Take 500 mcg by mouth daily.        Allergies:  Allergies  Allergen Reactions   Adhesive [Tape] Other (See Comments)    Blisters   Nitrofurantoin Monohyd Macro Nausea And Vomiting    Past Medical History, Surgical history, Social history, and Family History were reviewed and updated.  Review of Systems: All other 10 point review of systems is negative.   Physical Exam:  height is '5\' 4"'$  (1.626 m) and weight is 175 lb (79.4 kg). Her oral temperature is 98.2 F (36.8 C). Her blood pressure is 122/64 and her pulse is 75. Her respiration is 18 and oxygen saturation is 100%.   Wt Readings from Last 3 Encounters:  04/13/22 175 lb (79.4 kg)  12/27/21 170 lb (77.1 kg)  11/21/21 156 lb (70.8 kg)    Ocular: Sclerae unicteric, pupils equal, round and reactive to light Ear-nose-throat: Oropharynx clear, dentition fair Lymphatic: No cervical or supraclavicular adenopathy Lungs no rales or rhonchi, good excursion bilaterally Heart regular rate and rhythm, no murmur appreciated Abd soft, nontender, positive bowel sounds MSK no focal spinal tenderness, no joint  edema Neuro: non-focal, well-oriented, appropriate affect Breasts: Deferred   Lab Results  Component Value Date   WBC 5.2 04/13/2022   HGB 12.6 04/13/2022   HCT 39.7 04/13/2022   MCV 89.8 04/13/2022   PLT 373 04/13/2022   Lab Results  Component Value Date   FERRITIN 31 12/27/2021   IRON 111 12/27/2021   TIBC 445 12/27/2021   UIBC 334 12/27/2021   IRONPCTSAT 25 12/27/2021   Lab Results  Component Value Date   RETICCTPCT 1.2 04/13/2022   RBC 4.42 04/13/2022   No results found for: "KPAFRELGTCHN", "LAMBDASER", "KAPLAMBRATIO" No results found for: "IGGSERUM", "IGA", "IGMSERUM" No results found for:  "TOTALPROTELP", "ALBUMINELP", "A1GS", "A2GS", "BETS", "BETA2SER", "GAMS", "MSPIKE", "SPEI"   Chemistry      Component Value Date/Time   NA 140 10/31/2021 0844   K 4.7 10/31/2021 0844   CL 106 10/31/2021 0844   CO2 30 10/31/2021 0844   BUN 9 10/31/2021 0844   CREATININE 0.80 10/31/2021 0844      Component Value Date/Time   CALCIUM 8.6 (L) 10/31/2021 0844   ALKPHOS 57 10/31/2021 0844   AST 23 10/31/2021 0844   ALT 22 10/31/2021 0844   BILITOT 0.3 10/31/2021 0844       Impression and Plan: Catherine Washington is a very pleasant 46 yo caucasian female with iron deficiency anemia secondary to malabsorption. Iron studies pending.  Follow-up in 4 months.   Lottie Dawson, NP 7/28/20239:16 AM

## 2022-04-26 ENCOUNTER — Encounter: Payer: Self-pay | Admitting: Family

## 2022-04-26 ENCOUNTER — Encounter: Payer: Self-pay | Admitting: Family Medicine

## 2022-04-27 ENCOUNTER — Other Ambulatory Visit: Payer: Self-pay

## 2022-04-27 DIAGNOSIS — F339 Major depressive disorder, recurrent, unspecified: Secondary | ICD-10-CM

## 2022-04-27 MED ORDER — BUPROPION HCL 100 MG PO TABS: 100.0000 mg | ORAL_TABLET | Freq: Three times a day (TID) | ORAL | 0 refills | Status: DC

## 2022-05-08 ENCOUNTER — Encounter: Payer: Self-pay | Admitting: Family Medicine

## 2022-05-08 ENCOUNTER — Ambulatory Visit (INDEPENDENT_AMBULATORY_CARE_PROVIDER_SITE_OTHER): Payer: BC Managed Care – PPO | Admitting: Family Medicine

## 2022-05-08 VITALS — BP 136/80 | HR 85 | Temp 98.5°F | Ht 64.57 in | Wt 182.4 lb

## 2022-05-08 DIAGNOSIS — F339 Major depressive disorder, recurrent, unspecified: Secondary | ICD-10-CM | POA: Diagnosis not present

## 2022-05-08 DIAGNOSIS — Z0000 Encounter for general adult medical examination without abnormal findings: Secondary | ICD-10-CM | POA: Diagnosis not present

## 2022-05-08 LAB — CBC WITH DIFFERENTIAL/PLATELET
Basophils Absolute: 0.1 10*3/uL (ref 0.0–0.1)
Basophils Relative: 1.2 % (ref 0.0–3.0)
Eosinophils Absolute: 0.6 10*3/uL (ref 0.0–0.7)
Eosinophils Relative: 11.5 % — ABNORMAL HIGH (ref 0.0–5.0)
HCT: 37 % (ref 36.0–46.0)
Hemoglobin: 12.3 g/dL (ref 12.0–15.0)
Lymphocytes Relative: 29.9 % (ref 12.0–46.0)
Lymphs Abs: 1.6 10*3/uL (ref 0.7–4.0)
MCHC: 33.4 g/dL (ref 30.0–36.0)
MCV: 88.4 fl (ref 78.0–100.0)
Monocytes Absolute: 0.4 10*3/uL (ref 0.1–1.0)
Monocytes Relative: 7 % (ref 3.0–12.0)
Neutro Abs: 2.7 10*3/uL (ref 1.4–7.7)
Neutrophils Relative %: 50.4 % (ref 43.0–77.0)
Platelets: 395 10*3/uL (ref 150.0–400.0)
RBC: 4.18 Mil/uL (ref 3.87–5.11)
RDW: 13.9 % (ref 11.5–15.5)
WBC: 5.4 10*3/uL (ref 4.0–10.5)

## 2022-05-08 LAB — BASIC METABOLIC PANEL
BUN: 15 mg/dL (ref 6–23)
CO2: 29 mEq/L (ref 19–32)
Calcium: 9.3 mg/dL (ref 8.4–10.5)
Chloride: 104 mEq/L (ref 96–112)
Creatinine, Ser: 0.73 mg/dL (ref 0.40–1.20)
GFR: 99.14 mL/min (ref >60.00)
Glucose, Bld: 93 mg/dL (ref 70–99)
Potassium: 4.4 mEq/L (ref 3.5–5.1)
Sodium: 139 mEq/L (ref 135–145)

## 2022-05-08 LAB — HEPATIC FUNCTION PANEL
ALT: 15 U/L (ref 0–35)
AST: 20 U/L (ref 0–37)
Albumin: 4.2 g/dL (ref 3.5–5.2)
Alkaline Phosphatase: 54 U/L (ref 39–117)
Bilirubin, Direct: 0.1 mg/dL (ref 0.0–0.3)
Total Bilirubin: 0.4 mg/dL (ref 0.2–1.2)
Total Protein: 6.7 g/dL (ref 6.0–8.3)

## 2022-05-08 LAB — LIPID PANEL
Cholesterol: 162 mg/dL (ref 0–200)
HDL: 74.9 mg/dL (ref >39.00)
LDL Cholesterol: 73 mg/dL (ref 0–99)
NonHDL: 87.29
Total CHOL/HDL Ratio: 2
Triglycerides: 72 mg/dL (ref 0.0–149.0)
VLDL: 14.4 mg/dL (ref 0.0–40.0)

## 2022-05-08 LAB — TSH: TSH: 0.92 u[IU]/mL (ref 0.35–5.50)

## 2022-05-08 MED ORDER — BUPROPION HCL 100 MG PO TABS: 100.0000 mg | ORAL_TABLET | Freq: Three times a day (TID) | ORAL | 3 refills | Status: DC

## 2022-05-08 MED ORDER — TRAZODONE HCL 50 MG PO TABS: ORAL_TABLET | ORAL | 3 refills | Status: DC

## 2022-05-08 MED ORDER — WEGOVY 0.25 MG/0.5ML ~~LOC~~ SOAJ
0.2500 mg | SUBCUTANEOUS | 1 refills | Status: DC
Start: 2022-05-08 — End: 2022-07-11

## 2022-05-08 MED ORDER — VENLAFAXINE HCL ER 37.5 MG PO CP24: 37.5000 mg | ORAL_CAPSULE | Freq: Every day | ORAL | 3 refills | Status: DC

## 2022-05-08 NOTE — Progress Notes (Signed)
Established Patient Office Visit  Subjective   Patient ID: Catherine Washington, female    DOB: Jan 31, 1976  Age: 46 y.o. MRN: [id number]  Chief Complaint  Patient presents with   Annual Exam    HPI   Tristy is seen for physical exam.  She has history of obesity, recurrent depression, chronic insomnia.  Her depression is stable currently on Effexor XR and Wellbutrin.  She has been on this regimen for some time.  She is struggled with some weight gain during the past year.  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  She had gastric bypass several years ago.  History of some stress eating.  Trying to reduce snacking.  She had coronary calcium score last year of 17 which is 97 percentile.  Family history of CAD as below  Health maintenance reviewed  -Due for tetanus but declines at this time -Pap smears and mammograms through GYN and these are up-to-date -Declines hepatitis C screening.  She is low risk. -Colonoscopy due 11/32  Family history-both parents have had type 2 diabetes.  Both parents have hyperlipidemia history.  Father had hypertension history.  Father died of MI at age 42.  Mother is alive.  She has had paternal grandmother with ovarian cancer and an aunt with breast cancer recurrence x2  Social history-she is married and has 2 children.  Her youngest is attends [employer] and is age 38.  38 year old who is a English as a second language teacher.  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  No history of smoking.  No alcohol.  Past Medical History:  Diagnosis Date   Allergy    Anxiety    Chicken pox    Depression    Family history of adverse reaction to anesthesia    mother gets very nausaus   Family history of breast cancer    Family history of breast cancer    Family history of kidney cancer    Family history of melanoma    Family history of ovarian cancer    Family history of thyroid cancer    Headache    occasionally   Hyperlipidemia    Ulcerative  colitis ([place])    UTI (urinary tract infection)    Past Surgical History:  Procedure Laterality Date   ABDOMINAL HYSTERECTOMY  2006   dysfunctional bleeding   BREATH TEK H PYLORI N/A 12/06/2014   Procedure: BREATH TEK H PYLORI;  Surgeon: Greer Pickerel, MD;  Location: [facility];  Service: General;  Laterality: N/A;   childbirth  1995, 2003   CHOLECYSTECTOMY     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   Procedure: LAPAROSCOPIC ROUX-EN-Y GASTRIC BYPASS WITH UPPER ENDOSCOPY;  Surgeon: Greer Pickerel, MD;  Location: [facility];  Service: General;  Laterality: N/A;    reports that she has never smoked. She has never used smokeless tobacco. She reports that she does not drink alcohol and does not use drugs. family history includes Breast cancer in her maternal aunt; Cancer (age of onset: 79) in her maternal aunt; Diabetes in her father, maternal grandmother, mother, and paternal grandfather; Heart disease (age of onset: 39) in her father; Hyperlipidemia in her father and mother; Hypertension in her father; Kidney cancer in her maternal grandmother; Lung cancer in her paternal grandmother; Melanoma in her father and maternal grandmother; Mental illness in her mother; Ovarian cancer in her paternal grandmother; Stroke in her paternal grandmother; Thyroid cancer (age of onset: 5) in her father. Allergies  Allergen Reactions   Adhesive [Tape] Other (See Comments)    Blisters   Nitrofurantoin Monohyd Macro Nausea And Vomiting    Review of Systems  Constitutional:  Negative for chills, fever, malaise/fatigue and weight loss.  HENT:  Negative for hearing loss.   Eyes:  Negative for blurred vision and double vision.  Respiratory:  Negative for cough and shortness of breath.   Cardiovascular:  Negative for chest pain, palpitations and leg swelling.  Gastrointestinal:  Negative for abdominal pain, blood in stool, constipation and diarrhea.  Genitourinary:  Negative for dysuria.  Skin:  Negative for rash.   Neurological:  Negative for dizziness, speech change, seizures, loss of consciousness and headaches.  Endo/Heme/Allergies:  Negative for polydipsia.  Psychiatric/Behavioral:  Negative for depression.       Objective:     BP 136/80 (BP Location: Left Arm, Patient Position: Sitting, Cuff Size: Normal)   Pulse 85   Temp 98.5 F (36.9 C) (Oral)   Ht 5' 4.57" (1.64 m)   Wt 182 lb 6.4 oz (82.7 kg)   SpO2 98%   BMI 30.76 kg/m    Physical Exam Vitals reviewed.  Constitutional:      General: She is not in acute distress.    Appearance: Normal appearance. She is well-developed. She is not ill-appearing.  HENT:     Head: Normocephalic and atraumatic.     Right Ear: Tympanic membrane normal.     Left Ear: Tympanic membrane normal.     Mouth/Throat:     Mouth: Mucous membranes are moist.     Pharynx: Oropharynx is clear. No oropharyngeal exudate or posterior oropharyngeal erythema.  Eyes:     Pupils: Pupils are equal, round, and reactive to light.  Neck:     Thyroid: No thyromegaly.     Vascular: No JVD.  Cardiovascular:     Rate and Rhythm: Normal rate and regular rhythm.     Heart sounds:     No gallop.  Pulmonary:     Effort: Pulmonary effort is normal. No respiratory distress.     Breath sounds: Normal breath sounds. No wheezing or rales.  Musculoskeletal:     Cervical back: Neck supple.     Right lower leg: No edema.     Left lower leg: No edema.  Neurological:     Mental Status: She is alert.      No results found for any visits on 05/08/22.    The 10-year ASCVD risk score (Arnett DK, et al., 2019) is: 0.4%    Assessment & Plan:   Problem List Items Addressed This Visit       Unprioritized   Recurrent depression ([place])   Relevant Medications   buPROPion (WELLBUTRIN) 100 MG tablet   traZODone (DESYREL) 50 MG tablet   venlafaxine XR (EFFEXOR-XR) 37.5 MG 24 hr capsule   Other Visit Diagnoses     Physical exam    -  Primary   Relevant Orders   Basic  metabolic panel   Lipid panel   CBC with Differential/Platelet   TSH   Hepatic function panel     -Refilled regular medications for 1 year -Offered hepatitis C screening but she declines -Offered tetanus booster but she declines -She will continue GYN follow-up for Pap smears -Obtain screening labs as above  -We had a long discussion regarding potential options for weight loss.  We did discuss [facility] but she is not interested.  We also discussed GLP-1 medications such as  Mancel Parsons and she is interested if she can get this covered by insurance.  She does not have any contraindications.  Would start Wegovy 0.25 mg once weekly and if tolerating 1 month then titrate to 0.5 mg.  She is also aware that availability of medication might be an issue. If able to get started on Wegovy would plan follow-up in a few months to reassess  No follow-ups on file.    Carolann Littler, MD

## 2022-05-08 NOTE — Patient Instructions (Signed)
IF able to get the [facility] filled, give me some feedback in one month regarding tolerance.

## 2022-05-14 ENCOUNTER — Encounter: Payer: Self-pay | Admitting: Family Medicine

## 2022-05-30 ENCOUNTER — Other Ambulatory Visit: Payer: Self-pay | Admitting: Family Medicine

## 2022-05-30 DIAGNOSIS — Z09 Encounter for follow-up examination after completed treatment for conditions other than malignant neoplasm: Secondary | ICD-10-CM

## 2022-06-14 ENCOUNTER — Encounter: Payer: Self-pay | Admitting: Family Medicine

## 2022-07-06 ENCOUNTER — Ambulatory Visit
Admission: RE | Admit: 2022-07-06 | Discharge: 2022-07-06 | Disposition: A | Discharge status: Home / Self Care | Payer: BC Managed Care – PPO | Source: Ambulatory Visit | Attending: Family Medicine | Admitting: Family Medicine

## 2022-07-06 ENCOUNTER — Telehealth: Payer: BC Managed Care – PPO | Admitting: Family Medicine

## 2022-07-06 DIAGNOSIS — Z09 Encounter for follow-up examination after completed treatment for conditions other than malignant neoplasm: Secondary | ICD-10-CM

## 2022-07-06 DIAGNOSIS — R197 Diarrhea, unspecified: Secondary | ICD-10-CM

## 2022-07-06 NOTE — Progress Notes (Signed)
Because Ms. Catherine Washington, I feel your condition warrants further evaluation and I recommend that you be seen in a face to face visit. You will need to have stool testing to find out the cause of diarrhea.    NOTE: There will be NO CHARGE for this eVisit   If you are having a true medical emergency please call 911.      For an urgent face to face visit, [place] has seven urgent care centers for your convenience:     [facility] [place] Get Driving Directions [phone] [address] [place], [place] [address]    [facility]) Get Driving Directions [phone] [place], [place] [address]  [facility] [place] ([place]) Get Driving Directions [phone] [address] [place],  [place]  [address]  [facility] at [address] [place] Get Driving Directions  [phone] [address] [place],  [place] [address]   [facility] Get Driving Directions [phone] [address] [place], [place], [place] [address]   [facility] Get Driving Directions  [phone] [address], [place] [address]   [facility] [place] Get Driving Directions [phone] [address] [place], [place] [address]  Your MyChart E-visit questionnaire answers were reviewed by a board certified advanced clinical practitioner to complete your personal care plan based on your specific symptoms.  Thank you for using e-Visits.  I have provided 5 minutes of non face to face time during this encounter for chart review and documentation.

## 2022-07-08 ENCOUNTER — Encounter (HOSPITAL_COMMUNITY): Payer: Self-pay

## 2022-07-08 ENCOUNTER — Inpatient Hospital Stay (HOSPITAL_BASED_OUTPATIENT_CLINIC_OR_DEPARTMENT_OTHER)
Admission: EM | Admit: 2022-07-08 | Discharge: 2022-07-11 | DRG: 372 | Disposition: A | Discharge status: Home / Self Care | Payer: BC Managed Care – PPO | Attending: Internal Medicine | Admitting: Internal Medicine

## 2022-07-08 ENCOUNTER — Other Ambulatory Visit: Payer: Self-pay

## 2022-07-08 ENCOUNTER — Encounter (HOSPITAL_BASED_OUTPATIENT_CLINIC_OR_DEPARTMENT_OTHER): Payer: Self-pay | Admitting: Emergency Medicine

## 2022-07-08 ENCOUNTER — Emergency Department (HOSPITAL_BASED_OUTPATIENT_CLINIC_OR_DEPARTMENT_OTHER): Payer: BC Managed Care – PPO

## 2022-07-08 DIAGNOSIS — Z9049 Acquired absence of other specified parts of digestive tract: Secondary | ICD-10-CM

## 2022-07-08 DIAGNOSIS — D75839 Thrombocytosis, unspecified: Secondary | ICD-10-CM | POA: Diagnosis present

## 2022-07-08 DIAGNOSIS — Z8051 Family history of malignant neoplasm of kidney: Secondary | ICD-10-CM

## 2022-07-08 DIAGNOSIS — K6289 Other specified diseases of anus and rectum: Secondary | ICD-10-CM | POA: Diagnosis present

## 2022-07-08 DIAGNOSIS — F419 Anxiety disorder, unspecified: Secondary | ICD-10-CM | POA: Diagnosis present

## 2022-07-08 DIAGNOSIS — Z79899 Other long term (current) drug therapy: Secondary | ICD-10-CM

## 2022-07-08 DIAGNOSIS — K561 Intussusception: Secondary | ICD-10-CM | POA: Diagnosis not present

## 2022-07-08 DIAGNOSIS — E876 Hypokalemia: Secondary | ICD-10-CM | POA: Diagnosis present

## 2022-07-08 DIAGNOSIS — D649 Anemia, unspecified: Secondary | ICD-10-CM | POA: Diagnosis present

## 2022-07-08 DIAGNOSIS — E162 Hypoglycemia, unspecified: Secondary | ICD-10-CM | POA: Diagnosis present

## 2022-07-08 DIAGNOSIS — Z808 Family history of malignant neoplasm of other organs or systems: Secondary | ICD-10-CM

## 2022-07-08 DIAGNOSIS — Z90710 Acquired absence of both cervix and uterus: Secondary | ICD-10-CM

## 2022-07-08 DIAGNOSIS — Z6830 Body mass index (BMI) 30.0-30.9, adult: Secondary | ICD-10-CM

## 2022-07-08 DIAGNOSIS — Z7985 Long-term (current) use of injectable non-insulin antidiabetic drugs: Secondary | ICD-10-CM

## 2022-07-08 DIAGNOSIS — R197 Diarrhea, unspecified: Secondary | ICD-10-CM

## 2022-07-08 DIAGNOSIS — E669 Obesity, unspecified: Secondary | ICD-10-CM | POA: Diagnosis present

## 2022-07-08 DIAGNOSIS — A045 Campylobacter enteritis: Secondary | ICD-10-CM | POA: Diagnosis not present

## 2022-07-08 DIAGNOSIS — Z8041 Family history of malignant neoplasm of ovary: Secondary | ICD-10-CM

## 2022-07-08 DIAGNOSIS — Z83438 Family history of other disorder of lipoprotein metabolism and other lipidemia: Secondary | ICD-10-CM

## 2022-07-08 DIAGNOSIS — E8809 Other disorders of plasma-protein metabolism, not elsewhere classified: Secondary | ICD-10-CM | POA: Diagnosis not present

## 2022-07-08 DIAGNOSIS — K5289 Other specified noninfective gastroenteritis and colitis: Secondary | ICD-10-CM | POA: Diagnosis present

## 2022-07-08 DIAGNOSIS — Z823 Family history of stroke: Secondary | ICD-10-CM

## 2022-07-08 DIAGNOSIS — F32A Depression, unspecified: Secondary | ICD-10-CM | POA: Diagnosis present

## 2022-07-08 DIAGNOSIS — Z801 Family history of malignant neoplasm of trachea, bronchus and lung: Secondary | ICD-10-CM

## 2022-07-08 DIAGNOSIS — E872 Acidosis, unspecified: Secondary | ICD-10-CM | POA: Diagnosis present

## 2022-07-08 DIAGNOSIS — K5190 Ulcerative colitis, unspecified, without complications: Secondary | ICD-10-CM | POA: Diagnosis present

## 2022-07-08 DIAGNOSIS — Z8249 Family history of ischemic heart disease and other diseases of the circulatory system: Secondary | ICD-10-CM

## 2022-07-08 DIAGNOSIS — R112 Nausea with vomiting, unspecified: Secondary | ICD-10-CM

## 2022-07-08 DIAGNOSIS — E785 Hyperlipidemia, unspecified: Secondary | ICD-10-CM | POA: Diagnosis present

## 2022-07-08 DIAGNOSIS — Z9884 Bariatric surgery status: Secondary | ICD-10-CM

## 2022-07-08 DIAGNOSIS — M4316 Spondylolisthesis, lumbar region: Secondary | ICD-10-CM | POA: Diagnosis present

## 2022-07-08 DIAGNOSIS — Z803 Family history of malignant neoplasm of breast: Secondary | ICD-10-CM

## 2022-07-08 DIAGNOSIS — K529 Noninfective gastroenteritis and colitis, unspecified: Secondary | ICD-10-CM

## 2022-07-08 LAB — TROPONIN I (HIGH SENSITIVITY): Troponin I (High Sensitivity): 5 ng/L (ref <18)

## 2022-07-08 LAB — CBC
HCT: 40.3 % (ref 36.0–46.0)
Hemoglobin: 13.4 g/dL (ref 12.0–15.0)
MCH: 28.8 pg (ref 26.0–34.0)
MCHC: 33.3 g/dL (ref 30.0–36.0)
MCV: 86.5 fL (ref 80.0–100.0)
Platelets: 455 10*3/uL — ABNORMAL HIGH (ref 150–400)
RBC: 4.66 MIL/uL (ref 3.87–5.11)
RDW: 12.9 % (ref 11.5–15.5)
WBC: 6.6 10*3/uL (ref 4.0–10.5)
nRBC: 0 % (ref 0.0–0.2)

## 2022-07-08 LAB — COMPREHENSIVE METABOLIC PANEL
ALT: 16 U/L (ref 0–44)
AST: 20 U/L (ref 15–41)
Albumin: 3.7 g/dL (ref 3.5–5.0)
Alkaline Phosphatase: 66 U/L (ref 38–126)
Anion gap: 7 (ref 5–15)
BUN: 8 mg/dL (ref 6–20)
CO2: 26 mmol/L (ref 22–32)
Calcium: 9 mg/dL (ref 8.9–10.3)
Chloride: 103 mmol/L (ref 98–111)
Creatinine, Ser: 0.69 mg/dL (ref 0.44–1.00)
GFR, Estimated: >60 mL/min (ref >60)
Glucose, Bld: 92 mg/dL (ref 70–99)
Potassium: 3.4 mmol/L — ABNORMAL LOW (ref 3.5–5.1)
Sodium: 136 mmol/L (ref 135–145)
Total Bilirubin: 0.4 mg/dL (ref 0.3–1.2)
Total Protein: 7.4 g/dL (ref 6.5–8.1)

## 2022-07-08 LAB — LIPASE, BLOOD: Lipase: 31 U/L (ref 11–51)

## 2022-07-08 LAB — MAGNESIUM: Magnesium: 1.8 mg/dL (ref 1.7–2.4)

## 2022-07-08 MED ORDER — SODIUM CHLORIDE 0.9 % IV BOLUS
1000.0000 mL | Freq: Once | INTRAVENOUS | Status: AC
  Administered 2022-07-08: 1000 mL via INTRAVENOUS

## 2022-07-08 MED ORDER — ONDANSETRON HCL 4 MG/2ML IJ SOLN
4.0000 mg | Freq: Once | INTRAMUSCULAR | Status: AC
  Administered 2022-07-08: 4 mg via INTRAVENOUS
  Filled 2022-07-08: qty 2

## 2022-07-08 MED ORDER — FENTANYL CITRATE PF 50 MCG/ML IJ SOSY
50.0000 ug | PREFILLED_SYRINGE | Freq: Once | INTRAMUSCULAR | Status: AC
  Administered 2022-07-08: 50 ug via INTRAVENOUS
  Filled 2022-07-08: qty 1

## 2022-07-08 MED ORDER — IOHEXOL 300 MG/ML  SOLN
100.0000 mL | Freq: Once | INTRAMUSCULAR | Status: AC | PRN
  Administered 2022-07-08: 100 mL via INTRAVENOUS

## 2022-07-08 MED ORDER — FAMOTIDINE IN NACL 20-0.9 MG/50ML-% IV SOLN
20.0000 mg | Freq: Once | INTRAVENOUS | Status: AC
  Administered 2022-07-08: 20 mg via INTRAVENOUS
  Filled 2022-07-08: qty 50

## 2022-07-08 MED ORDER — PIPERACILLIN-TAZOBACTAM 3.375 G IVPB 30 MIN
3.3750 g | Freq: Once | INTRAVENOUS | Status: AC
  Administered 2022-07-09: 3.375 g via INTRAVENOUS
  Filled 2022-07-08: qty 50

## 2022-07-08 NOTE — ED Triage Notes (Signed)
Pt reports diarrhea x 5d; nausea and heartburn/reflux

## 2022-07-08 NOTE — ED Provider Notes (Signed)
[facility] Provider Note   CSN: [id number] Arrival date & time: 07/08/22  1947     History {Add pertinent medical, surgical, social history, OB history to HPI:1} Chief Complaint  Patient presents with   Diarrhea    Catherine Washington is a 46 y.o. female who presents emergency department with chief complaint of 5 days of severe abdominal pain nausea vomiting and diarrhea.  She has never had anything like this before.  She has watery diarrhea and burning epigastric abdominal pain.  She has been taking up to 4 Imodium daily without any improvement.  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 difficile colitis, no recent foreign travel, no ingestion of suspicious food or contacts with similar symptoms.  She denies fever or chills.  She rates her pain as severe, constant, achy, burning.  Pain is unrelieved with any medications.  Status post Roux-en-Y gastric bypass several years ago   Diarrhea      Home Medications Prior to Admission medications   Medication Sig Start Date End Date Taking? Authorizing Provider  buPROPion (WELLBUTRIN) 100 MG tablet Take 1 tablet (100 mg total) by mouth 3 (three) times daily. 05/08/22   Burchette, Alinda Sierras, MD  Ferrous Sulfate (IRON) 325 (65 Fe) MG TABS Take by mouth.    [provider]  fexofenadine (ALLEGRA) 180 MG tablet Take 180 mg by mouth daily.    [provider]  Multiple Vitamin (MULTIVITAMIN) tablet Take 1 tablet by mouth daily.    [provider]  Polyethylene Glycol 3350 (MIRALAX PO) Take by mouth. Taking everyday    [provider]  Semaglutide-Weight Management (WEGOVY) 0.25 MG/0.5ML SOAJ Inject 0.25 mg into the skin once a week. 05/08/22   Burchette, Alinda Sierras, MD  traZODone (DESYREL) 50 MG tablet Take one tablet by mouth at night as needed for insomnia 05/08/22   Eulas Post, MD  venlafaxine XR (EFFEXOR-XR) 37.5 MG 24 hr capsule Take 1 capsule  (37.5 mg total) by mouth daily with breakfast. 05/08/22   Burchette, Alinda Sierras, MD  vitamin B-12 (CYANOCOBALAMIN) 500 MCG tablet Take 500 mcg by mouth daily.    [provider]  vitamin C (ASCORBIC ACID) 500 MG tablet Take 500 mg by mouth daily.    [provider]      Allergies    Adhesive [tape] and Nitrofurantoin monohyd macro    Review of Systems   Review of Systems  Gastrointestinal:  Positive for diarrhea.    Physical Exam Updated Vital Signs BP (!) 141/71   Pulse 98   Temp 98.4 F (36.9 C) (Oral)   Resp 15   Ht '5\' 4"'$  (1.626 m)   Wt 79.4 kg   SpO2 100%   BMI 30.04 kg/m  Physical Exam Vitals and nursing note reviewed.  Constitutional:      General: She is not in acute distress.    Appearance: She is well-developed. She is ill-appearing. She is not diaphoretic.  HENT:     Head: Normocephalic and atraumatic.     Right Ear: External ear normal.     Left Ear: External ear normal.     Nose: Nose normal.     Mouth/Throat:     Mouth: Mucous membranes are moist.  Eyes:     General: No scleral icterus.    Conjunctiva/sclera: Conjunctivae normal.  Cardiovascular:     Rate and Rhythm: Normal rate and regular rhythm.  Heart sounds: Normal heart sounds. No murmur heard.    No friction rub. No gallop.  Pulmonary:     Effort: Pulmonary effort is normal. No respiratory distress.     Breath sounds: Normal breath sounds.  Abdominal:     General: Bowel sounds are normal. There is no distension.     Palpations: Abdomen is soft. There is no mass.     Tenderness: There is generalized abdominal tenderness and tenderness in the epigastric area. There is guarding.  Musculoskeletal:     Cervical back: Normal range of motion.  Skin:    General: Skin is warm and dry.  Neurological:     Mental Status: She is alert and oriented to person, place, and time.  Psychiatric:        Behavior: Behavior normal.     ED Results / Procedures / Treatments   Labs (all labs  ordered are listed, but only abnormal results are displayed) Labs Reviewed  COMPREHENSIVE METABOLIC PANEL - Abnormal; Notable for the following components:      Result Value   Potassium 3.4 (*)    All other components within normal limits  CBC - Abnormal; Notable for the following components:   Platelets 455 (*)    All other components within normal limits  GASTROINTESTINAL PANEL BY PCR, STOOL (REPLACES STOOL CULTURE)  C DIFFICILE QUICK SCREEN W PCR REFLEX    LIPASE, BLOOD  MAGNESIUM  URINALYSIS, ROUTINE W REFLEX MICROSCOPIC  TROPONIN I (HIGH SENSITIVITY)    EKG EKG Interpretation  Date/Time:  Sunday July 08 2022 20:11:05 EDT Ventricular Rate:  102 PR Interval:  137 QRS Duration: 103 QT Interval:  362 QTC Calculation: 472 R Axis:   -14 Text Interpretation: Sinus tachycardia LAE, consider biatrial enlargement Minimal ST depression, inferior leads when cmpared to prior before today, faster rate. no STEMI Confirmed by Antony Blackbird [phone]) on 07/08/2022 8:15:27 PM  Radiology No results found.  Procedures Procedures  {Document cardiac monitor, telemetry assessment procedure when appropriate:1}  Medications Ordered in ED Medications  famotidine (PEPCID) IVPB 20 mg premix (20 mg Intravenous New Bag/Given 07/08/22 2142)  iohexol (OMNIPAQUE) 300 MG/ML solution 100 mL (has no administration in time range)  sodium chloride 0.9 % bolus 1,000 mL (1,000 mLs Intravenous New Bag/Given 07/08/22 2141)  fentaNYL (SUBLIMAZE) injection 50 mcg (50 mcg Intravenous Given 07/08/22 2140)    ED Course/ Medical Decision Making/ A&P                           Medical Decision Making Amount and/or Complexity of Data Reviewed Labs: ordered. Radiology: ordered.  Risk Prescription drug management.   ***  {Document critical care time when appropriate:1} {Document review of labs and clinical decision tools ie heart score, Chads2Vasc2 etc:1}  {Document your independent review of radiology  images, and any outside records:1} {Document your discussion with family members, caretakers, and with consultants:1} {Document social determinants of health affecting pt's care:1} {Document your decision making why or why not admission, treatments were needed:1} Final Clinical Impression(s) / ED Diagnoses Final diagnoses:  None    Rx / DC Orders ED Discharge Orders     None

## 2022-07-08 NOTE — Plan of Care (Signed)
[facility] will assume care on arrival to accepting facility. Until arrival, care as per EDP. However, [facility] available 24/7 for questions and assistance.  Nursing staff, please page [facility] Admits and Consults ([phone]) as soon as the patient arrives to the hospital.   

## 2022-07-08 NOTE — ED Notes (Signed)
Patient transported to CT 

## 2022-07-09 DIAGNOSIS — Z6830 Body mass index (BMI) 30.0-30.9, adult: Secondary | ICD-10-CM | POA: Diagnosis not present

## 2022-07-09 DIAGNOSIS — K529 Noninfective gastroenteritis and colitis, unspecified: Secondary | ICD-10-CM | POA: Diagnosis not present

## 2022-07-09 DIAGNOSIS — E872 Acidosis, unspecified: Secondary | ICD-10-CM | POA: Diagnosis present

## 2022-07-09 DIAGNOSIS — Z9884 Bariatric surgery status: Secondary | ICD-10-CM | POA: Diagnosis not present

## 2022-07-09 DIAGNOSIS — Z79899 Other long term (current) drug therapy: Secondary | ICD-10-CM | POA: Diagnosis not present

## 2022-07-09 DIAGNOSIS — E162 Hypoglycemia, unspecified: Secondary | ICD-10-CM | POA: Diagnosis present

## 2022-07-09 DIAGNOSIS — M4316 Spondylolisthesis, lumbar region: Secondary | ICD-10-CM | POA: Diagnosis present

## 2022-07-09 DIAGNOSIS — Z8041 Family history of malignant neoplasm of ovary: Secondary | ICD-10-CM | POA: Diagnosis not present

## 2022-07-09 DIAGNOSIS — F419 Anxiety disorder, unspecified: Secondary | ICD-10-CM

## 2022-07-09 DIAGNOSIS — D75839 Thrombocytosis, unspecified: Secondary | ICD-10-CM | POA: Diagnosis present

## 2022-07-09 DIAGNOSIS — Z801 Family history of malignant neoplasm of trachea, bronchus and lung: Secondary | ICD-10-CM | POA: Diagnosis not present

## 2022-07-09 DIAGNOSIS — E876 Hypokalemia: Secondary | ICD-10-CM

## 2022-07-09 DIAGNOSIS — Z9049 Acquired absence of other specified parts of digestive tract: Secondary | ICD-10-CM | POA: Diagnosis not present

## 2022-07-09 DIAGNOSIS — E669 Obesity, unspecified: Secondary | ICD-10-CM

## 2022-07-09 DIAGNOSIS — Z803 Family history of malignant neoplasm of breast: Secondary | ICD-10-CM | POA: Diagnosis not present

## 2022-07-09 DIAGNOSIS — F32A Depression, unspecified: Secondary | ICD-10-CM

## 2022-07-09 DIAGNOSIS — E8809 Other disorders of plasma-protein metabolism, not elsewhere classified: Secondary | ICD-10-CM | POA: Diagnosis not present

## 2022-07-09 DIAGNOSIS — E785 Hyperlipidemia, unspecified: Secondary | ICD-10-CM | POA: Diagnosis present

## 2022-07-09 DIAGNOSIS — K561 Intussusception: Secondary | ICD-10-CM | POA: Diagnosis present

## 2022-07-09 DIAGNOSIS — K5289 Other specified noninfective gastroenteritis and colitis: Secondary | ICD-10-CM | POA: Diagnosis present

## 2022-07-09 DIAGNOSIS — Z7985 Long-term (current) use of injectable non-insulin antidiabetic drugs: Secondary | ICD-10-CM | POA: Diagnosis not present

## 2022-07-09 DIAGNOSIS — R197 Diarrhea, unspecified: Secondary | ICD-10-CM | POA: Diagnosis not present

## 2022-07-09 DIAGNOSIS — K5190 Ulcerative colitis, unspecified, without complications: Secondary | ICD-10-CM | POA: Diagnosis present

## 2022-07-09 DIAGNOSIS — R1013 Epigastric pain: Secondary | ICD-10-CM | POA: Diagnosis not present

## 2022-07-09 DIAGNOSIS — K6289 Other specified diseases of anus and rectum: Secondary | ICD-10-CM | POA: Diagnosis present

## 2022-07-09 DIAGNOSIS — D649 Anemia, unspecified: Secondary | ICD-10-CM | POA: Diagnosis present

## 2022-07-09 DIAGNOSIS — A045 Campylobacter enteritis: Secondary | ICD-10-CM | POA: Diagnosis present

## 2022-07-09 LAB — COMPREHENSIVE METABOLIC PANEL
ALT: 12 U/L (ref 0–44)
AST: 18 U/L (ref 15–41)
Albumin: 3 g/dL — ABNORMAL LOW (ref 3.5–5.0)
Alkaline Phosphatase: 52 U/L (ref 38–126)
Anion gap: 11 (ref 5–15)
BUN: 8 mg/dL (ref 6–20)
CO2: 22 mmol/L (ref 22–32)
Calcium: 8.4 mg/dL — ABNORMAL LOW (ref 8.9–10.3)
Chloride: 105 mmol/L (ref 98–111)
Creatinine, Ser: 0.61 mg/dL (ref 0.44–1.00)
GFR, Estimated: >60 mL/min (ref >60)
Glucose, Bld: 70 mg/dL (ref 70–99)
Potassium: 3.7 mmol/L (ref 3.5–5.1)
Sodium: 138 mmol/L (ref 135–145)
Total Bilirubin: 0.8 mg/dL (ref 0.3–1.2)
Total Protein: 6.2 g/dL — ABNORMAL LOW (ref 6.5–8.1)

## 2022-07-09 LAB — CBC WITH DIFFERENTIAL/PLATELET
Abs Immature Granulocytes: 0.07 10*3/uL (ref 0.00–0.07)
Basophils Absolute: 0.1 10*3/uL (ref 0.0–0.1)
Basophils Relative: 1 %
Eosinophils Absolute: 0.3 10*3/uL (ref 0.0–0.5)
Eosinophils Relative: 4 %
HCT: 37 % (ref 36.0–46.0)
Hemoglobin: 12 g/dL (ref 12.0–15.0)
Immature Granulocytes: 1 %
Lymphocytes Relative: 29 %
Lymphs Abs: 2 10*3/uL (ref 0.7–4.0)
MCH: 28.6 pg (ref 26.0–34.0)
MCHC: 32.4 g/dL (ref 30.0–36.0)
MCV: 88.1 fL (ref 80.0–100.0)
Monocytes Absolute: 1.1 10*3/uL — ABNORMAL HIGH (ref 0.1–1.0)
Monocytes Relative: 16 %
Neutro Abs: 3.5 10*3/uL (ref 1.7–7.7)
Neutrophils Relative %: 49 %
Platelets: 426 10*3/uL — ABNORMAL HIGH (ref 150–400)
RBC: 4.2 MIL/uL (ref 3.87–5.11)
RDW: 13 % (ref 11.5–15.5)
WBC: 6.9 10*3/uL (ref 4.0–10.5)
nRBC: 0 % (ref 0.0–0.2)

## 2022-07-09 LAB — URINALYSIS, MICROSCOPIC (REFLEX)

## 2022-07-09 LAB — URINALYSIS, ROUTINE W REFLEX MICROSCOPIC
Glucose, UA: NEGATIVE mg/dL
Hgb urine dipstick: NEGATIVE
Ketones, ur: >=80 mg/dL — AB
Leukocytes,Ua: NEGATIVE
Nitrite: NEGATIVE
Protein, ur: 30 mg/dL — AB
Specific Gravity, Urine: >=1.03 (ref 1.005–1.030)
pH: 6 (ref 5.0–8.0)

## 2022-07-09 LAB — PHOSPHORUS: Phosphorus: 2.9 mg/dL (ref 2.5–4.6)

## 2022-07-09 LAB — HIV ANTIBODY (ROUTINE TESTING W REFLEX): HIV Screen 4th Generation wRfx: NONREACTIVE

## 2022-07-09 LAB — C DIFFICILE QUICK SCREEN W PCR REFLEX
C Diff antigen: NEGATIVE
C Diff interpretation: NOT DETECTED
C Diff toxin: NEGATIVE

## 2022-07-09 LAB — MAGNESIUM: Magnesium: 1.7 mg/dL (ref 1.7–2.4)

## 2022-07-09 MED ORDER — HYDRALAZINE HCL 20 MG/ML IJ SOLN: 5.0000 mg | Freq: Four times a day (QID) | INTRAMUSCULAR | Status: DC | PRN

## 2022-07-09 MED ORDER — ONDANSETRON HCL 4 MG PO TABS: 4.0000 mg | ORAL_TABLET | Freq: Four times a day (QID) | ORAL | Status: DC | PRN

## 2022-07-09 MED ORDER — BISACODYL 10 MG RE SUPP: 10.0000 mg | Freq: Every day | RECTAL | Status: DC | PRN

## 2022-07-09 MED ORDER — FENTANYL CITRATE PF 50 MCG/ML IJ SOSY
50.0000 ug | PREFILLED_SYRINGE | INTRAMUSCULAR | Status: DC | PRN
  Administered 2022-07-09 – 2022-07-10 (×10): 50 ug via INTRAVENOUS
  Filled 2022-07-09 (×10): qty 1

## 2022-07-09 MED ORDER — ONDANSETRON HCL 4 MG/2ML IJ SOLN
4.0000 mg | Freq: Four times a day (QID) | INTRAMUSCULAR | Status: DC | PRN
  Administered 2022-07-11: 4 mg via INTRAVENOUS
  Filled 2022-07-09: qty 2

## 2022-07-09 MED ORDER — ENOXAPARIN SODIUM 40 MG/0.4ML IJ SOSY
40.0000 mg | PREFILLED_SYRINGE | INTRAMUSCULAR | Status: DC
  Administered 2022-07-09 – 2022-07-10 (×2): 40 mg via SUBCUTANEOUS
  Filled 2022-07-09 (×2): qty 0.4

## 2022-07-09 MED ORDER — SODIUM CHLORIDE 0.9 % IV SOLN: INTRAVENOUS | Status: DC

## 2022-07-09 MED ORDER — ACETAMINOPHEN 650 MG RE SUPP: 650.0000 mg | Freq: Four times a day (QID) | RECTAL | Status: DC | PRN

## 2022-07-09 MED ORDER — LACTATED RINGERS IV SOLN: INTRAVENOUS | Status: DC

## 2022-07-09 MED ORDER — ACETAMINOPHEN 325 MG PO TABS
650.0000 mg | ORAL_TABLET | Freq: Four times a day (QID) | ORAL | Status: DC | PRN
  Administered 2022-07-11: 650 mg via ORAL
  Filled 2022-07-09: qty 2

## 2022-07-09 MED ORDER — PIPERACILLIN-TAZOBACTAM 3.375 G IVPB
3.3750 g | Freq: Three times a day (TID) | INTRAVENOUS | Status: DC
  Administered 2022-07-09 – 2022-07-11 (×6): 3.375 g via INTRAVENOUS
  Filled 2022-07-09 (×6): qty 50

## 2022-07-09 NOTE — ED Notes (Signed)
Phone Handoff Report given to rec RN at [facility]

## 2022-07-09 NOTE — ED Notes (Signed)
Safety measures remain in place, cont to be NPO until further orders rec from admitting MD.

## 2022-07-09 NOTE — H&P (Signed)
History and Physical    Patient: Catherine Washington KVQ:[id number] DOB: 07-25-1976 DOA: 07/08/2022 DOS: the patient was seen and examined on 07/09/2022 PCP: Eulas Post, MD  Patient coming from: Home  Chief Complaint:  Chief Complaint  Patient presents with   Diarrhea   HPI: Catherine Washington is a 46 y.o. female with medical history significant for but not limited to hyperlipidemia, history of ulcerative colitis, history of depression and anxiety as well as history of Roux-en-Y gastric bypass surgery several years ago along with history of laparoscopic cholecystectomy and abdominal hysterectomy who presents with a 5-day history of worsening diarrhea and 2-day history of abdominal pain which is severe.  Patient states that the pain feels like a burning and gnawing dull sensation but when palpating it feels a "sharp pain and when it comes it is 10 out of 10 in severity.  She is taking some Imodium and the diarrhea was refractory to Imodium.  She continues to have some nausea but no real emesis.  She denies chest pain lightheadedness or dizziness and just felt generally unwell the last 5 days.  Given her symptoms she presented to the ED for further evaluation and work-up was done and had a CT scan of the abdomen and pelvis and showed "Long segment left abdomen enteritis with associated small small bowel intussusception. Ascending and transverse colitis as well as proctitis. No bowel obstruction or perforation. 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 Associated severe degenerative changes of the L4-L5 level"  Because of her symptoms [facility]  hospitalist was asked to admit this patient for further evaluation and medical management and surgery consulted for intussusception as well as an enteritis/colitis.  Surgery feels that this intussusception is transient and recommends bowel rest and antibiotics  Review of Systems: As mentioned in the history of present  illness. All other systems reviewed and are negative.  Past Medical History:  Diagnosis Date   Allergy    Anxiety    Chicken pox    Depression    Family history of adverse reaction to anesthesia    mother gets very nausaus   Family history of breast cancer    Family history of breast cancer    Family history of kidney cancer    Family history of melanoma    Family history of ovarian cancer    Family history of thyroid cancer    Headache    occasionally   Hyperlipidemia    Ulcerative colitis ([place])    UTI (urinary tract infection)    Past Surgical History:  Procedure Laterality Date   ABDOMINAL HYSTERECTOMY  2006   dysfunctional bleeding   BREATH TEK H PYLORI N/A 12/06/2014   Procedure: BREATH TEK H PYLORI;  Surgeon: Greer Pickerel, MD;  Location: [facility];  Service: General;  Laterality: N/A;   childbirth  1995, 2003   CHOLECYSTECTOMY     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   Procedure: LAPAROSCOPIC ROUX-EN-Y GASTRIC BYPASS WITH UPPER ENDOSCOPY;  Surgeon: Greer Pickerel, MD;  Location: [facility];  Service: General;  Laterality: N/A;   Social History:  reports that she has never smoked. She has never used smokeless tobacco. She reports that she does not drink alcohol and does not use drugs.  Allergies  Allergen Reactions   Nitrofurantoin Monohyd Macro Nausea And Vomiting   Tape Other (See Comments)    Blisters Other reaction(s): Not available    Family History  Problem Relation Age of Onset  Hyperlipidemia Mother    Mental illness Mother    Diabetes Mother        type 2 diabetes   Hyperlipidemia Father    Heart disease Father 53       MI   Hypertension Father    Diabetes Father    Thyroid cancer Father 71   Melanoma Father    Diabetes Maternal Grandmother    Kidney cancer Maternal Grandmother    Melanoma Maternal Grandmother    Ovarian cancer Paternal Grandmother        dx late 14s   Stroke Paternal Grandmother    Lung cancer Paternal Grandmother        dx 64s, hx  smoking   Diabetes Paternal Grandfather    Breast cancer Maternal Aunt        diagonsed x2, first in 68s   Cancer Maternal Aunt 64       metastatic; lung, liver, panc, spleen   Colon cancer Neg Hx    Rectal cancer Neg Hx    Esophageal cancer Neg Hx     Prior to Admission medications   Medication Sig Start Date End Date Taking? Authorizing Provider  buPROPion (WELLBUTRIN) 100 MG tablet Take 1 tablet (100 mg total) by mouth 3 (three) times daily. 05/08/22   Burchette, Alinda Sierras, MD  Ferrous Sulfate (IRON) 325 (65 Fe) MG TABS Take by mouth.    [provider]  fexofenadine (ALLEGRA) 180 MG tablet Take 180 mg by mouth daily.    [provider]  Multiple Vitamin (MULTIVITAMIN) tablet Take 1 tablet by mouth daily.    [provider]  Polyethylene Glycol 3350 (MIRALAX PO) Take by mouth. Taking everyday    [provider]  Semaglutide-Weight Management (WEGOVY) 0.25 MG/0.5ML SOAJ Inject 0.25 mg into the skin once a week. 05/08/22   Burchette, Alinda Sierras, MD  traZODone (DESYREL) 50 MG tablet Take one tablet by mouth at night as needed for insomnia 05/08/22   Eulas Post, MD  venlafaxine XR (EFFEXOR-XR) 37.5 MG 24 hr capsule Take 1 capsule (37.5 mg total) by mouth daily with breakfast. 05/08/22   Burchette, Alinda Sierras, MD  vitamin B-12 (CYANOCOBALAMIN) 500 MCG tablet Take 500 mcg by mouth daily.    [provider]  vitamin C (ASCORBIC ACID) 500 MG tablet Take 500 mg by mouth daily.    [provider]    Physical Exam: Vitals:   07/09/22 1000 07/09/22 1200 07/09/22 1232 07/09/22 1515  BP: 134/69  126/63 (!) 147/74  Pulse: 87  88 95  Resp: '19  12 18  '$ Temp:  98 F (36.7 C)  98.9 F (37.2 C)  TempSrc:    Oral  SpO2: 95%  98% 98%  Weight:      Height:       Examination: Physical Exam:  Constitutional: WN/WD obese Caucasian female currently no acute distress appears calm but slightly uncomfortable Respiratory: Diminished to auscultation  bilaterally, no wheezing, rales, rhonchi or crackles. Normal respiratory effort and patient is not tachypenic. No accessory muscle use.  Unlabored breathing Cardiovascular: RRR, no murmurs / rubs / gallops. S1 and S2 auscultated. No extremity edema.  Abdomen: Soft, tender to palpate, distended secondary body habitus.  Bowel sounds positive.  GU: Deferred. Musculoskeletal: No clubbing / cyanosis of digits/nails. No joint deformity upper and lower extremities.  Skin: No rashes, lesions, ulcers. No induration; Warm and dry.  Neurologic: CN 2-12 grossly intact with no focal deficits. Sensation intact in all 4 Extremities,  DTR normal. Strength 5/5 in all 4. Romberg sign cerebellar reflexes not assessed.  Psychiatric: Normal judgment and insight. Alert and oriented x 3. Normal mood and appropriate affect.   Data Reviewed: Recent Results (from the past 2160 hour(s))  Reticulocytes     Status: None   Collection Time: 04/13/22  8:18 AM  Result Value Ref Range   Retic Ct Pct 1.2 0.4 - 3.1 %   RBC. 4.39 3.87 - 5.11 MIL/uL   Retic Count, Absolute 50.5 19.0 - 186.0 K/uL   Immature Retic Fract 5.7 2.3 - 15.9 %    Comment: Performed at [facility] at [facility], [address] [place], [place] [address]  Iron and Iron Binding Capacity ([facility] only)     Status: None   Collection Time: 04/13/22  8:19 AM  Result Value Ref Range   Iron 101 28 - 170 ug/dL   TIBC 438 250 - 450 ug/dL   Saturation Ratios 23 10.4 - 31.8 %   UIBC 337 148 - 442 ug/dL    Comment: Performed at [facility], [place] [address] [place], [place] [address]  Ferritin     Status: None   Collection Time: 04/13/22  8:19 AM  Result Value Ref Range   Ferritin 16 11 - 307 ng/mL    Comment: Performed at [facility], [address], [place], [place] [address]  CBC with Differential ([place] Only)     Status: Abnormal   Collection Time: 04/13/22  8:19 AM   Result Value Ref Range   WBC Count 5.2 4.0 - 10.5 K/uL   RBC 4.42 3.87 - 5.11 MIL/uL   Hemoglobin 12.6 12.0 - 15.0 g/dL   HCT 39.7 36.0 - 46.0 %   MCV 89.8 80.0 - 100.0 fL   MCH 28.5 26.0 - 34.0 pg   MCHC 31.7 30.0 - 36.0 g/dL   RDW 13.1 11.5 - 15.5 %   Platelet Count 373 150 - 400 K/uL   nRBC 0.0 0.0 - 0.2 %   Neutrophils Relative % 38 %   Neutro Abs 2.0 1.7 - 7.7 K/uL   Lymphocytes Relative 40 %   Lymphs Abs 2.1 0.7 - 4.0 K/uL   Monocytes Relative 9 %   Monocytes Absolute 0.5 0.1 - 1.0 K/uL   Eosinophils Relative 11 %   Eosinophils Absolute 0.6 (H) 0.0 - 0.5 K/uL   Basophils Relative 1 %   Basophils Absolute 0.1 0.0 - 0.1 K/uL   Immature Granulocytes 1 %   Abs Immature Granulocytes 0.05 0.00 - 0.07 K/uL    Comment: Performed at [facility] at [facility], [address] [place], [place] [address]  Basic metabolic panel     Status: None   Collection Time: 05/08/22  8:39 AM  Result Value Ref Range   Sodium 139 135 - 145 mEq/L   Potassium 4.4 3.5 - 5.1 mEq/L   Chloride 104 96 - 112 mEq/L   CO2 29 19 - 32 mEq/L   Glucose, Bld 93 70 - 99 mg/dL   BUN 15 6 - 23 mg/dL   Creatinine, Ser 0.73 0.40 - 1.20 mg/dL   GFR 99.14 >60.00 mL/min    Comment: Calculated using the CKD-EPI Creatinine Equation (2021)   Calcium 9.3 8.4 - 10.5 mg/dL  Lipid panel     Status: None   Collection Time: 05/08/22  8:39 AM  Result Value Ref Range  Cholesterol 162 0 - 200 mg/dL    Comment: ATP III Classification       Desirable:  < 200 mg/dL               Borderline High:  200 - 239 mg/dL          High:  > = 240 mg/dL   Triglycerides 72.0 0.0 - 149.0 mg/dL    Comment: Normal:  <150 mg/dLBorderline High:  150 - 199 mg/dL   HDL 74.90 >39.00 mg/dL   VLDL 14.4 0.0 - 40.0 mg/dL   LDL Cholesterol 73 0 - 99 mg/dL   Total CHOL/HDL Ratio 2     Comment:                Men          Women1/2 Average Risk     3.4          3.3Average Risk          5.0          4.42X Average Risk           9.6          7.13X Average Risk          15.0          11.0                       NonHDL 87.29     Comment: NOTE:  Non-HDL goal should be 30 mg/dL higher than patient's LDL goal (i.e. LDL goal of < 70 mg/dL, would have non-HDL goal of < 100 mg/dL)  CBC with Differential/Platelet     Status: Abnormal   Collection Time: 05/08/22  8:39 AM  Result Value Ref Range   WBC 5.4 4.0 - 10.5 K/uL   RBC 4.18 3.87 - 5.11 Mil/uL   Hemoglobin 12.3 12.0 - 15.0 g/dL   HCT 37.0 36.0 - 46.0 %   MCV 88.4 78.0 - 100.0 fl   MCHC 33.4 30.0 - 36.0 g/dL   RDW 13.9 11.5 - 15.5 %   Platelets 395.0 150.0 - 400.0 K/uL   Neutrophils Relative % 50.4 43.0 - 77.0 %   Lymphocytes Relative 29.9 12.0 - 46.0 %   Monocytes Relative 7.0 3.0 - 12.0 %   Eosinophils Relative 11.5 (H) 0.0 - 5.0 %   Basophils Relative 1.2 0.0 - 3.0 %   Neutro Abs 2.7 1.4 - 7.7 K/uL   Lymphs Abs 1.6 0.7 - 4.0 K/uL   Monocytes Absolute 0.4 0.1 - 1.0 K/uL   Eosinophils Absolute 0.6 0.0 - 0.7 K/uL   Basophils Absolute 0.1 0.0 - 0.1 K/uL  TSH     Status: None   Collection Time: 05/08/22  8:39 AM  Result Value Ref Range   TSH 0.92 0.35 - 5.50 uIU/mL  Hepatic function panel     Status: None   Collection Time: 05/08/22  8:39 AM  Result Value Ref Range   Total Bilirubin 0.4 0.2 - 1.2 mg/dL   Bilirubin, Direct 0.1 0.0 - 0.3 mg/dL   Alkaline Phosphatase 54 39 - 117 U/L   AST 20 0 - 37 U/L   ALT 15 0 - 35 U/L   Total Protein 6.7 6.0 - 8.3 g/dL   Albumin 4.2 3.5 - 5.2 g/dL  Lipase, blood     Status: None   Collection Time: 07/08/22  8:12 PM  Result Value Ref Range   Lipase 31 11 -  51 U/L    Comment: Performed at [facility], [address] [place], [place] [address]  Comprehensive metabolic panel     Status: Abnormal   Collection Time: 07/08/22  8:12 PM  Result Value Ref Range   Sodium 136 135 - 145 mmol/L   Potassium 3.4 (L) 3.5 - 5.1 mmol/L   Chloride 103 98 - 111 mmol/L   CO2 26 22 - 32 mmol/L   Glucose, Bld 92 70 - 99  mg/dL    Comment: Glucose reference range applies only to samples taken after fasting for at least 8 hours.   BUN 8 6 - 20 mg/dL   Creatinine, Ser 0.69 0.44 - 1.00 mg/dL   Calcium 9.0 8.9 - 10.3 mg/dL   Total Protein 7.4 6.5 - 8.1 g/dL   Albumin 3.7 3.5 - 5.0 g/dL   AST 20 15 - 41 U/L   ALT 16 0 - 44 U/L   Alkaline Phosphatase 66 38 - 126 U/L   Total Bilirubin 0.4 0.3 - 1.2 mg/dL   GFR, Estimated >60 >60 mL/min    Comment: (NOTE) Calculated using the CKD-EPI Creatinine Equation (2021)    Anion gap 7 5 - 15    Comment: Performed at [facility], [address] [place], [place] [address]  CBC     Status: Abnormal   Collection Time: 07/08/22  8:12 PM  Result Value Ref Range   WBC 6.6 4.0 - 10.5 K/uL   RBC 4.66 3.87 - 5.11 MIL/uL   Hemoglobin 13.4 12.0 - 15.0 g/dL   HCT 40.3 36.0 - 46.0 %   MCV 86.5 80.0 - 100.0 fL   MCH 28.8 26.0 - 34.0 pg   MCHC 33.3 30.0 - 36.0 g/dL   RDW 12.9 11.5 - 15.5 %   Platelets 455 (H) 150 - 400 K/uL   nRBC 0.0 0.0 - 0.2 %    Comment: Performed at [facility], [address] [place], [place] [address]  Magnesium     Status: None   Collection Time: 07/08/22  8:12 PM  Result Value Ref Range   Magnesium 1.8 1.7 - 2.4 mg/dL    Comment: Performed at [facility], [address] [place], [place] [address]  Troponin I (High Sensitivity)     Status: None   Collection Time: 07/08/22  8:12 PM  Result Value Ref Range   Troponin I (High Sensitivity) 5 <18 ng/L    Comment: (NOTE) Elevated high sensitivity troponin I (hsTnI) values and significant  changes across serial measurements may suggest ACS but many other  chronic and acute conditions are known to elevate hsTnI results.  Refer to the "Links" section for chest pain algorithms and additional  guidance. Performed at [facility], [address] [place], [place] [address]   C Difficile Quick Screen w PCR reflex     Status: None   Collection Time: 07/08/22   9:39 PM   Specimen: Stool  Result Value Ref Range   C Diff antigen NEGATIVE NEGATIVE   C Diff toxin NEGATIVE NEGATIVE   C Diff interpretation No C. difficile detected.     Comment: Performed at [facility], [place] [address], [place], [place] [address]  Urinalysis, Routine w reflex microscopic Urine, Clean Catch     Status: Abnormal   Collection Time: 07/09/22  7:39 AM  Result Value Ref Range   Color, Urine YELLOW YELLOW  APPearance HAZY (A) CLEAR   Specific Gravity, Urine >=1.030 1.005 - 1.030   pH 6.0 5.0 - 8.0   Glucose, UA NEGATIVE NEGATIVE mg/dL   Hgb urine dipstick NEGATIVE NEGATIVE   Bilirubin Urine MODERATE (A) NEGATIVE   Ketones, ur >=80 (A) NEGATIVE mg/dL   Protein, ur 30 (A) NEGATIVE mg/dL   Nitrite NEGATIVE NEGATIVE   Leukocytes,Ua NEGATIVE NEGATIVE    Comment: Performed at [facility], [address] [place], [place] [address]  Urinalysis, Microscopic (reflex)     Status: Abnormal   Collection Time: 07/09/22  7:39 AM  Result Value Ref Range   RBC / HPF 0-5 0 - 5 RBC/hpf   WBC, UA 0-5 0 - 5 WBC/hpf   Bacteria, UA RARE (A) NONE SEEN   Squamous Epithelial / LPF 0-5 0 - 5   Mucus PRESENT    Hyaline Casts, UA PRESENT     Comment: Performed at [facility], [address] [place], [place] [address]   EKG: Showed Sinus Tachycardia with a Qtc of 470.   Assessment and Plan: No notes have been filed under this hospital service. Service: Hospitalist  Nausea, abdominal pain and diarrhea in the setting of colitis and long segment enteritis with intussusception in the setting of her history of prior Roux-en-Y gastric bypass -Admitted to inpatient telemetry -Continue with supportive care and made n.p.o. except ice chips -Continue antiemetics with ondansetron -CT of the abdomen pelvis done and showed "IMPRESSION: 1. Long segment left abdomen enteritis with associated small small bowel intussusception. Ascending and transverse colitis as well  as proctitis. No bowel obstruction or perforation. 2. 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 Associated severe degenerative changes of the L4-L5 level." -We will obtain blood cultures x2 -C. difficile and GI pathogen panel ordered and negative -Given a 1 L bolus in the ED and placed on maintenance IV fluid hydration with lactated Ringer's at 125 MLS per hour we will change IV fluid hydration with normal saline at 100 MLS per hour -Check labs as she did not have any labs today but labs yesterday showed no WBC -Appreciate surgery evaluation and they are recommending antibiotics and supportive care and n.p.o. and bowel rest; they feel that the intussusception is likely transient and they be discussed with the case with Dr. Kieth Brightly and at this point they are recommending continuing serial abdominal exams and if the patient fails to improve that she may need a repeat CT scan versus possible diagnostic laparoscopic -Hold her [facility] weight management medications  Hypokalemia -Was hypokalemic on yesterday's labs -Repeat labs are pending now -Check mag level -Continue monitor and replete as necessary -Repeat CMP in a.m.  Thrombocytosis -Patient's Platelet Count is now 455 -Continue to Monitor and Trend and Repeat CMP in the AM   Depression and anxiety -Hold her home antidepressants including her bupropion 100 mg p.o. 3 times daily as well as venlafaxine XR 37.5 mg p.o. with breakfast given that she is n.p.o.  Obesity -Complicates overall prognosis and care -Estimated body mass index is 30.04 kg/m as calculated from the following:   Height as of this encounter: '5\' 4"'$  (1.626 m).   Weight as of this encounter: 79.4 kg.  -Weight Loss and Dietary Counseling given    Advance Care Planning:   Code Status: Full Code   Consults: General Surgery  Family Communication: Discussed with Husband at bedside  Severity of Illness: The appropriate patient  status  for this patient is INPATIENT. Inpatient status is judged to be reasonable and necessary in order to provide the required intensity of service to ensure the patient's safety. The patient's presenting symptoms, physical exam findings, and initial radiographic and laboratory data in the context of their chronic comorbidities is felt to place them at high risk for further clinical deterioration. Furthermore, it is not anticipated that the patient will be medically stable for discharge from the hospital within 2 midnights of admission.   * I certify that at the point of admission it is my clinical judgment that the patient will require inpatient hospital care spanning beyond 2 midnights from the point of admission due to high intensity of service, high risk for further deterioration and high frequency of surveillance required.*  Author: Raiford Noble, DO 07/09/2022 5:11 PM  For on call review [email or web address].

## 2022-07-09 NOTE — ED Notes (Signed)
Pt husband called, update given

## 2022-07-09 NOTE — ED Notes (Signed)
Pt request Call Pt spouse with room/admin info

## 2022-07-09 NOTE — ED Notes (Signed)
RASS 0 at this time, GCS 15

## 2022-07-09 NOTE — Consult Note (Signed)
Reason for Consult:abdominal pain Referring Physician: Dr. Justice Deeds is an 45 y.o. female.  HPI: Pt is a 46 year old female comes in secondary to abdominal pain.  Patient states that she had 5 days history of diarrhea that was not responding to Imodium.  She states that she had some nausea and emesis.  Does state that she had some blood in her stool.  This last occurred at the outside ER.  Patient did have a laparoscopic bypass and lap chole.  Lap bypass was in 2016.  Patient undergo CT scan.  Patient was found to have intussusception as well as colitis.  This essentially upon looking appears to be at the [place] anastomosis.  Patient was transferred for further management and consultation.  Past Medical History:  Diagnosis Date   Allergy    Anxiety    Chicken pox    Depression    Family history of adverse reaction to anesthesia    mother gets very nausaus   Family history of breast cancer    Family history of breast cancer    Family history of kidney cancer    Family history of melanoma    Family history of ovarian cancer    Family history of thyroid cancer    Headache    occasionally   Hyperlipidemia    Ulcerative colitis ([place])    UTI (urinary tract infection)     Past Surgical History:  Procedure Laterality Date   ABDOMINAL HYSTERECTOMY  2006   dysfunctional bleeding   BREATH TEK H PYLORI N/A 12/06/2014   Procedure: BREATH TEK H PYLORI;  Surgeon: Greer Pickerel, MD;  Location: [facility];  Service: General;  Laterality: N/A;   childbirth  1995, 2003   CHOLECYSTECTOMY     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   Procedure: LAPAROSCOPIC ROUX-EN-Y GASTRIC BYPASS WITH UPPER ENDOSCOPY;  Surgeon: Greer Pickerel, MD;  Location: [facility];  Service: General;  Laterality: N/A;    Family History  Problem Relation Age of Onset   Hyperlipidemia Mother    Mental illness Mother    Diabetes Mother        type 2 diabetes   Hyperlipidemia Father    Heart disease Father 60       MI    Hypertension Father    Diabetes Father    Thyroid cancer Father 23   Melanoma Father    Diabetes Maternal Grandmother    Kidney cancer Maternal Grandmother    Melanoma Maternal Grandmother    Ovarian cancer Paternal Grandmother        dx late 66s   Stroke Paternal Grandmother    Lung cancer Paternal Grandmother        dx 20s, hx smoking   Diabetes Paternal Grandfather    Breast cancer Maternal Aunt        diagonsed x2, first in 16s   Cancer Maternal Aunt 64       metastatic; lung, liver, panc, spleen   Colon cancer Neg Hx    Rectal cancer Neg Hx    Esophageal cancer Neg Hx     Social History:  reports that she has never smoked. She has never used smokeless tobacco. She reports that she does not drink alcohol and does not use drugs.  Allergies:  Allergies  Allergen Reactions   Adhesive [Tape] Other (See Comments)    Blisters   Nitrofurantoin Monohyd Macro Nausea And Vomiting    Medications: I have reviewed the patient's current medications.  Results for orders  placed or performed during the hospital encounter of 07/08/22 (from the past 48 hour(s))  Lipase, blood     Status: None   Collection Time: 07/08/22  8:12 PM  Result Value Ref Range   Lipase 31 11 - 51 U/L    Comment: Performed at [facility], [address] [place], [place] [address]  Comprehensive metabolic panel     Status: Abnormal   Collection Time: 07/08/22  8:12 PM  Result Value Ref Range   Sodium 136 135 - 145 mmol/L   Potassium 3.4 (L) 3.5 - 5.1 mmol/L   Chloride 103 98 - 111 mmol/L   CO2 26 22 - 32 mmol/L   Glucose, Bld 92 70 - 99 mg/dL    Comment: Glucose reference range applies only to samples taken after fasting for at least 8 hours.   BUN 8 6 - 20 mg/dL   Creatinine, Ser 0.69 0.44 - 1.00 mg/dL   Calcium 9.0 8.9 - 10.3 mg/dL   Total Protein 7.4 6.5 - 8.1 g/dL   Albumin 3.7 3.5 - 5.0 g/dL   AST 20 15 - 41 U/L   ALT 16 0 - 44 U/L   Alkaline Phosphatase 66 38 - 126 U/L   Total  Bilirubin 0.4 0.3 - 1.2 mg/dL   GFR, Estimated >60 >60 mL/min    Comment: (NOTE) Calculated using the CKD-EPI Creatinine Equation (2021)    Anion gap 7 5 - 15    Comment: Performed at [facility], [address] [place], [place] [address]  CBC     Status: Abnormal   Collection Time: 07/08/22  8:12 PM  Result Value Ref Range   WBC 6.6 4.0 - 10.5 K/uL   RBC 4.66 3.87 - 5.11 MIL/uL   Hemoglobin 13.4 12.0 - 15.0 g/dL   HCT 40.3 36.0 - 46.0 %   MCV 86.5 80.0 - 100.0 fL   MCH 28.8 26.0 - 34.0 pg   MCHC 33.3 30.0 - 36.0 g/dL   RDW 12.9 11.5 - 15.5 %   Platelets 455 (H) 150 - 400 K/uL   nRBC 0.0 0.0 - 0.2 %    Comment: Performed at [facility], [address] [place], [place] [address]  Magnesium     Status: None   Collection Time: 07/08/22  8:12 PM  Result Value Ref Range   Magnesium 1.8 1.7 - 2.4 mg/dL    Comment: Performed at [facility], [address] [place], [place] [address]  Troponin I (High Sensitivity)     Status: None   Collection Time: 07/08/22  8:12 PM  Result Value Ref Range   Troponin I (High Sensitivity) 5 <18 ng/L    Comment: (NOTE) Elevated high sensitivity troponin I (hsTnI) values and significant  changes across serial measurements may suggest ACS but many other  chronic and acute conditions are known to elevate hsTnI results.  Refer to the "Links" section for chest pain algorithms and additional  guidance. Performed at [facility], [address] [place], [place] [address]   C Difficile Quick Screen w PCR reflex     Status: None   Collection Time: 07/08/22  9:39 PM   Specimen: Stool  Result Value Ref Range   C Diff antigen NEGATIVE NEGATIVE   C Diff toxin NEGATIVE NEGATIVE   C Diff interpretation No C. difficile detected.     Comment: Performed at [facility], [address] [place], [place] [address]  Urinalysis, Routine w reflex microscopic Urine, Clean Catch     Status: Abnormal   Collection  Time: 07/09/22  7:39 AM  Result Value Ref Range   Color, Urine YELLOW YELLOW   APPearance HAZY (A) CLEAR   Specific Gravity, Urine >=1.030 1.005 - 1.030   pH 6.0 5.0 - 8.0   Glucose, UA NEGATIVE NEGATIVE mg/dL   Hgb urine dipstick NEGATIVE NEGATIVE   Bilirubin Urine MODERATE (A) NEGATIVE   Ketones, ur >=80 (A) NEGATIVE mg/dL   Protein, ur 30 (A) NEGATIVE mg/dL   Nitrite NEGATIVE NEGATIVE   Leukocytes,Ua NEGATIVE NEGATIVE    Comment: Performed at [facility], [address] [place], [place] [address]  Urinalysis, Microscopic (reflex)     Status: Abnormal   Collection Time: 07/09/22  7:39 AM  Result Value Ref Range   RBC / HPF 0-5 0 - 5 RBC/hpf   WBC, UA 0-5 0 - 5 WBC/hpf   Bacteria, UA RARE (A) NONE SEEN   Squamous Epithelial / LPF 0-5 0 - 5   Mucus PRESENT    Hyaline Casts, UA PRESENT     Comment: Performed at [facility], [address] [place], [place] [address]    CT ABDOMEN PELVIS W CONTRAST  Result Date: 07/08/2022 CLINICAL DATA:  Abdominal pain, acute, nonlocalized. Pt reports diarrhea x 5d; nausea and heartburn/reflux EXAM: CT ABDOMEN AND PELVIS WITH CONTRAST TECHNIQUE: Multidetector CT imaging of the abdomen and pelvis was performed using the standard protocol following bolus administration of intravenous contrast. RADIATION DOSE REDUCTION: This exam was performed according to the departmental dose-optimization program which includes automated exposure control, adjustment of the mA and/or kV according to patient size and/or use of iterative reconstruction technique. CONTRAST:  1108m OMNIPAQUE IOHEXOL 300 MG/ML  SOLN COMPARISON:  None Available. FINDINGS: Lower chest: No acute abnormality. Hepatobiliary: No focal liver abnormality. Status post cholecystectomy. No biliary dilatation. Pancreas: No focal lesion. Normal pancreatic contour. No surrounding inflammatory changes. No main pancreatic ductal dilatation. Spleen: Normal in size without focal  abnormality. Adrenals/Urinary Tract: No adrenal nodule bilaterally. Bilateral kidneys enhance symmetrically. Subcentimeter hypodensities are too small to characterize. No hydronephrosis. No hydroureter. The urinary bladder is unremarkable. Stomach/Bowel: Roux-en-Y gastric bypass. Stomach is within normal limits. No bowel dilatation. Long segment of small bowel within the left abdomen with bowel wall thickening. Associated small-small bowel intussusception (5:55, 2:59). Colonic submucosal hyperemia and mild bowel wall thickening and haziness of the bowel wall of the ascending colon, transverse colon, and rectum. Appendix appears normal. Vascular/Lymphatic: No abdominal aorta or iliac aneurysm. No abdominal, pelvic, or inguinal lymphadenopathy. Reproductive: Status post hysterectomy. No adnexal masses. Other: No intraperitoneal free fluid. No intraperitoneal free gas. No organized fluid collection. Musculoskeletal: No abdominal wall hernia or abnormality. No suspicious lytic or blastic osseous lesions. No acute displaced fracture. 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 Associated severe degenerative changes of the L4-L5 level. IMPRESSION: 1. Long segment left abdomen enteritis with associated small small bowel intussusception. Ascending and transverse colitis as well as proctitis. No bowel obstruction or perforation. 2. 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 Associated severe degenerative changes of the L4-L5 level. Electronically Signed   By: MIven FinnM.D.   On: 07/08/2022 22:26    Review of Systems  Constitutional:  Negative for chills and fever.  HENT:  Negative for ear discharge, hearing loss  and sore throat.   Eyes:  Negative for discharge.  Respiratory:  Negative for cough and shortness of breath.   Cardiovascular:  Negative for chest pain and leg swelling.  Gastrointestinal:  Positive for abdominal pain,  blood in stool and nausea. Negative for constipation, diarrhea and vomiting.  Musculoskeletal:  Negative for myalgias and neck pain.  Skin:  Negative for rash.  Allergic/Immunologic: Negative for environmental allergies.  Neurological:  Negative for dizziness and seizures.  Hematological:  Does not bruise/bleed easily.  Psychiatric/Behavioral:  Negative for suicidal ideas.   All other systems reviewed and are negative.  Blood pressure (!) 147/74, pulse 95, temperature 98.9 F (37.2 C), temperature source Oral, resp. rate 18, height '5\' 4"'$  (1.626 m), weight 79.4 kg, SpO2 98 %. Physical Exam Constitutional:      Appearance: She is well-developed.     Comments: Conversant No acute distress  HENT:     Head: Normocephalic and atraumatic.  Eyes:     General: Lids are normal. No scleral icterus.    Pupils: Pupils are equal, round, and reactive to light.     Comments: Pupils are equal round and reactive No lid lag Moist conjunctiva  Neck:     Thyroid: No thyromegaly.     Trachea: No tracheal tenderness.     Comments: No cervical lymphadenopathy Cardiovascular:     Rate and Rhythm: Normal rate and regular rhythm.     Heart sounds: No murmur heard. Pulmonary:     Effort: Pulmonary effort is normal.     Breath sounds: Normal breath sounds. No wheezing or rales.  Abdominal:     Tenderness: There is abdominal tenderness (centrally).     Hernia: No hernia is present.  Musculoskeletal:     Cervical back: Normal range of motion and neck supple.  Skin:    General: Skin is warm.     Findings: No rash.     Nails: There is no clubbing.     Comments: Normal skin turgor  Neurological:     Mental Status: She is alert and oriented to person, place, and time.     Comments: Normal gait and station  Psychiatric:        Mood and Affect: Mood normal.        Thought Content: Thought content normal.        Judgment: Judgment normal.     Comments: Appropriate affect     Assessment/Plan: 38  female with abdominal pain, colitis and likely a transient intussusception.  This appears to be at the patient's jejunojejunostomy.  Patient at this point likely has colitis.  This would benefit from antibiotics and bowel rest.  Patient with intussusception.  This is likely transient.  I did discuss with Dr. Kieth Brightly.  At this point we will continue with serial abdominal exams.  If patient fails to improve she may require CT scan versus possible diagnostic laparoscopy.  We will continue to follow along.  Ralene Ok 07/09/2022, 4:27 PM

## 2022-07-09 NOTE — ED Notes (Signed)
Asked Dr. Tyrone Nine if patient is able to drink, states to keep pt NPO at this time. Requested maintenance fluids for hydration, LR infusion ordered. Pt in no acute distress at this time, denies needs. Pain meds provided.

## 2022-07-09 NOTE — ED Notes (Signed)
Pt ambulatory to restroom

## 2022-07-09 NOTE — ED Notes (Signed)
Report given to [facility] 

## 2022-07-09 NOTE — ED Notes (Signed)
[facility] at bedside 

## 2022-07-09 NOTE — Progress Notes (Signed)
Pharmacy Antibiotic Note  Catherine Washington is a 46 y.o. female admitted on 07/08/2022 with intra-abdominal infection, intussusception with colitis.  Pharmacy has been consulted for Zosyn dosing.  Plan: Zosyn 3.375g IV Q8H infused over 4hrs. Dosage remains stable at need for further dosage adjustment appears unlikely at present.  Pharmacy will sign off at this time.    Height: '5\' 4"'$  (162.6 cm) Weight: 79.4 kg (175 lb) IBW/kg (Calculated) : 54.7  Temp (24hrs), Avg:98.4 F (36.9 C), Min:98 F (36.7 C), Max:98.9 F (37.2 C)  Recent Labs  Lab 07/08/22 2012  WBC 6.6  CREATININE 0.69    Estimated Creatinine Clearance: 90.6 mL/min (by C-G formula based on SCr of 0.69 mg/dL).    Allergies  Allergen Reactions   Adhesive [Tape] Other (See Comments)    Blisters   Nitrofurantoin Monohyd Macro Nausea And Vomiting    Antimicrobials this admission: 10/23 Zosyn >>   Microbiology results: 10/22 Cdiff: ag negative, toxin negative 10/22 GI panel:   Thank you for allowing pharmacy to be a part of this patient's care.  Gretta Arab PharmD, BCPS Clinical Pharmacist [facility] main pharmacy [phone] 07/09/2022 4:52 PM

## 2022-07-09 NOTE — ED Notes (Signed)
Patient denies pain and is resting comfortably.  

## 2022-07-09 NOTE — ED Notes (Signed)
Updated provided to patient regarding plan of care and room assignment

## 2022-07-10 ENCOUNTER — Inpatient Hospital Stay (HOSPITAL_COMMUNITY): Payer: BC Managed Care – PPO

## 2022-07-10 DIAGNOSIS — K561 Intussusception: Secondary | ICD-10-CM | POA: Diagnosis not present

## 2022-07-10 DIAGNOSIS — K529 Noninfective gastroenteritis and colitis, unspecified: Secondary | ICD-10-CM | POA: Diagnosis not present

## 2022-07-10 DIAGNOSIS — R1013 Epigastric pain: Secondary | ICD-10-CM | POA: Diagnosis not present

## 2022-07-10 DIAGNOSIS — A045 Campylobacter enteritis: Secondary | ICD-10-CM | POA: Diagnosis not present

## 2022-07-10 LAB — COMPREHENSIVE METABOLIC PANEL
ALT: 11 U/L (ref 0–44)
AST: 14 U/L — ABNORMAL LOW (ref 15–41)
Albumin: 2.8 g/dL — ABNORMAL LOW (ref 3.5–5.0)
Alkaline Phosphatase: 47 U/L (ref 38–126)
Anion gap: 10 (ref 5–15)
BUN: 7 mg/dL (ref 6–20)
CO2: 19 mmol/L — ABNORMAL LOW (ref 22–32)
Calcium: 8 mg/dL — ABNORMAL LOW (ref 8.9–10.3)
Chloride: 108 mmol/L (ref 98–111)
Creatinine, Ser: 0.59 mg/dL (ref 0.44–1.00)
GFR, Estimated: >60 mL/min (ref >60)
Glucose, Bld: 62 mg/dL — ABNORMAL LOW (ref 70–99)
Potassium: 3.1 mmol/L — ABNORMAL LOW (ref 3.5–5.1)
Sodium: 137 mmol/L (ref 135–145)
Total Bilirubin: 0.8 mg/dL (ref 0.3–1.2)
Total Protein: 5.5 g/dL — ABNORMAL LOW (ref 6.5–8.1)

## 2022-07-10 LAB — CBC WITH DIFFERENTIAL/PLATELET
Abs Immature Granulocytes: 0.1 10*3/uL — ABNORMAL HIGH (ref 0.00–0.07)
Basophils Absolute: 0.1 10*3/uL (ref 0.0–0.1)
Basophils Relative: 1 %
Eosinophils Absolute: 0.3 10*3/uL (ref 0.0–0.5)
Eosinophils Relative: 4 %
HCT: 34.7 % — ABNORMAL LOW (ref 36.0–46.0)
Hemoglobin: 11.2 g/dL — ABNORMAL LOW (ref 12.0–15.0)
Immature Granulocytes: 2 %
Lymphocytes Relative: 33 %
Lymphs Abs: 2.2 10*3/uL (ref 0.7–4.0)
MCH: 28.6 pg (ref 26.0–34.0)
MCHC: 32.3 g/dL (ref 30.0–36.0)
MCV: 88.5 fL (ref 80.0–100.0)
Monocytes Absolute: 0.9 10*3/uL (ref 0.1–1.0)
Monocytes Relative: 14 %
Neutro Abs: 3.1 10*3/uL (ref 1.7–7.7)
Neutrophils Relative %: 46 %
Platelets: 387 10*3/uL (ref 150–400)
RBC: 3.92 MIL/uL (ref 3.87–5.11)
RDW: 12.8 % (ref 11.5–15.5)
WBC: 6.6 10*3/uL (ref 4.0–10.5)
nRBC: 0 % (ref 0.0–0.2)

## 2022-07-10 LAB — GASTROINTESTINAL PANEL BY PCR, STOOL (REPLACES STOOL CULTURE)

## 2022-07-10 LAB — GLUCOSE, CAPILLARY
Glucose-Capillary: 56 mg/dL — ABNORMAL LOW (ref 70–99)
Glucose-Capillary: 59 mg/dL — ABNORMAL LOW (ref 70–99)
Glucose-Capillary: 75 mg/dL (ref 70–99)

## 2022-07-10 LAB — PHOSPHORUS: Phosphorus: 2.8 mg/dL (ref 2.5–4.6)

## 2022-07-10 LAB — MAGNESIUM: Magnesium: 1.8 mg/dL (ref 1.7–2.4)

## 2022-07-10 MED ORDER — IOHEXOL 9 MG/ML PO SOLN
500.0000 mL | ORAL | Status: AC
  Administered 2022-07-10 (×2): 500 mL via ORAL

## 2022-07-10 MED ORDER — IOHEXOL 9 MG/ML PO SOLN
ORAL | Status: AC
  Filled 2022-07-10: qty 1000

## 2022-07-10 MED ORDER — IOHEXOL 300 MG/ML  SOLN
100.0000 mL | Freq: Once | INTRAMUSCULAR | Status: AC | PRN
  Administered 2022-07-10: 100 mL via INTRAVENOUS

## 2022-07-10 MED ORDER — HYDROMORPHONE HCL 1 MG/ML IJ SOLN
1.0000 mg | INTRAMUSCULAR | Status: DC | PRN
  Administered 2022-07-10 – 2022-07-11 (×8): 1 mg via INTRAVENOUS
  Filled 2022-07-10 (×8): qty 1

## 2022-07-10 MED ORDER — KCL IN DEXTROSE-NACL 40-5-0.45 MEQ/L-%-% IV SOLN
INTRAVENOUS | Status: DC
  Filled 2022-07-10 (×6): qty 1000

## 2022-07-10 MED ORDER — AZITHROMYCIN 250 MG PO TABS
500.0000 mg | ORAL_TABLET | Freq: Every day | ORAL | Status: DC
  Administered 2022-07-10 – 2022-07-11 (×2): 500 mg via ORAL
  Filled 2022-07-10 (×2): qty 2

## 2022-07-10 MED ORDER — POTASSIUM CHLORIDE 10 MEQ/100ML IV SOLN
10.0000 meq | INTRAVENOUS | Status: DC
  Administered 2022-07-10: 10 meq via INTRAVENOUS
  Filled 2022-07-10: qty 100

## 2022-07-10 NOTE — Progress Notes (Signed)
PROGRESS NOTE    Catherine Washington  GDJ:[id number] DOB: 01/27/1976 DOA: 07/08/2022 PCP: Eulas Post, MD   Brief Narrative:  Catherine Washington is a 46 y.o. female with medical history significant for but not limited to hyperlipidemia, history of ulcerative colitis, history of depression and anxiety as well as history of Roux-en-Y gastric bypass surgery several years ago along with history of laparoscopic cholecystectomy and abdominal hysterectomy who presents with a 5-day history of worsening diarrhea and 2-day history of abdominal pain which is severe.  Patient states that the pain feels like a burning and gnawing dull sensation but when palpating it feels a "sharp pain and when it comes it is 10 out of 10 in severity.  She is taking some Imodium and the diarrhea was refractory to Imodium.  She continues to have some nausea but no real emesis.  She denies chest pain lightheadedness or dizziness and just felt generally unwell the last 5 days.  Given her symptoms she presented to the ED for further evaluation and work-up was done and had a CT scan of the abdomen and pelvis and showed "Long segment left abdomen enteritis with associated small small bowel intussusception. Ascending and transverse colitis as well as proctitis. No bowel obstruction or perforation. 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 Associated severe degenerative changes of the L4-L5 level"   Because of her symptoms [facility]  hospitalist was asked to admit this patient for further evaluation and medical management and surgery consulted for intussusception as well as an enteritis/colitis.  Surgery feels that this intussusception is transient and recommends bowel rest and antibiotics   **Interim History Patient's abdominal pain continued to be persistent and worsening.  She was changed from IV fentanyl to IV hydromorphone 1 mg every 3 hours.  She had 3 bowel movements since admission and had some  bright red blood that had some dark stools.  Given her worsening abdominal pain surgery is planning to repeat a CT scan with contrast to see if the intussusception is still present and if so then she may need surgical intervention if not then they are recommending conservative measures with antibiotic therapy for colitis at this time.  Assessment and Plan: No notes have been filed under this hospital service. Service: Hospitalist  Nausea, abdominal pain and diarrhea in the setting of Campylobacter infection and Colitis and long segment enteritis with intussusception in the setting of her history of prior Roux-en-Y gastric bypass -Admitted to inpatient telemetry -Continue with supportive care and made n.p.o. except ice chips -Continue antiemetics with ondansetron -CT of the abdomen pelvis done and showed "IMPRESSION: 1. Long segment left abdomen enteritis with associated small small bowel intussusception. Ascending and transverse colitis as well as proctitis. No bowel obstruction or perforation. 2. 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 Associated severe degenerative changes of the L4-L5 level." -We will obtain blood cultures x2 -C. difficile and GI pathogen panel ordered and negative C. difficile but GI pathogen panel is positive for Campylobacter species; will give 3 days of azithromycin 500 mg daily for the Campylobacter and continue IV Zosyn for the colitis -Given a 1 L bolus in the ED and placed on maintenance IV fluid hydration with lactated Ringer's at 125 MLS per hour we will change IV fluid hydration with normal saline at 100 MLS per hour -Check labs as she did not have any labs today but labs yesterday showed no WBC -Appreciate surgery  evaluation and they are recommending antibiotics and supportive care and n.p.o. and bowel rest; they feel that the intussusception is likely transient and they be discussed with the case with Dr. Kieth Brightly and  at this point they are recommending continuing serial abdominal exams and if the patient fails to improve that she may need a repeat CT scan versus possible diagnostic laparoscopic -Hold her [facility] weight management medications  Metabolic Acidosis -Patient is a slight acidosis with a CO2 of 19, anion gap of 10, chloride level 108 -Continue IV fluid hydration as above and continue monitor and trend and repeat CMP in the a.m.   Hypokalemia -Was hypokalemic on yesterday's labs and again this AM at 3.1 -Replete with IV Kcl 40 mEQ x1 and now with D5 1/2 NS + 40 mEQ x 100 mL/hr; however patient was unable to tolerate IV KCl given burning -Check mag level and was 1.8 and replete with IV mag sulfate 2 g -Continue monitor and replete as necessary -Repeat CMP in a.m.  Hypoglycemia -Patient's Blood Sugar dropped to 56 and stated on IVF as above -Continue to Monitor and Trend -Repeat CBC in the AM   Hypoalbuminemia -Patient's albumin level is now gone from 3.7 -> 3.0 -> 2.8 -Continue monitor and trend and repeat CMP in a.m.   Thrombocytosis -Patient's Platelet Count was 455 and now trending down and went from 426 -> 387 -Continue to Monitor and Trend and Repeat CMP in the AM   Normocytic Anemia -The patient's hemoglobin/hematocrit went from 13.4/40.3 -> 12.0/37.0 -> 11.2/34.7 -Check anemia panel in the a.m. -Continue monitor for signs and symptoms of bleeding; no overt bleeding noted -Repeat CBC in a.m.   Depression and Anxiety -Hold her home antidepressants including her Bupropion 100 mg p.o. 3 times daily as well as Venlafaxine XR 37.5 mg p.o. with breakfast given that she is n.p.o.   Obesity -Complicates overall prognosis and care -Estimated body mass index is 30.04 kg/m as calculated from the following:   Height as of this encounter: '5\' 4"'$  (1.626 m).   Weight as of this encounter: 79.4 kg.  -Weight Loss and Dietary Counseling given  DVT prophylaxis: enoxaparin (LOVENOX) injection  40 mg Start: 07/09/22 2200 SCDs Start: 07/09/22 1704    Code Status: Full Code Family Communication: No family currently at bedside  Disposition Plan:  Level of care: Telemetry Status is: Inpatient Remains inpatient appropriate because: Needs further clinical improvement and clearance by general surgery for safe discharge disposition   Consultants:  General surgery  Procedures:  As above   Antimicrobials:  Anti-infectives (From admission, onward)    Start     Dose/Rate Route Frequency Ordered Stop   07/09/22 1800  piperacillin-tazobactam (ZOSYN) IVPB 3.375 g        3.375 g 12.5 mL/hr over 240 Minutes Intravenous Every 8 hours 07/09/22 1654     07/09/22 0000  piperacillin-tazobactam (ZOSYN) IVPB 3.375 g        3.375 g 100 mL/hr over 30 Minutes Intravenous  Once 07/08/22 2358 07/09/22 0042       Subjective: Seen and examined at bedside and examined at bedside and she is still having some abdominal discomfort and pain and states is worse than yesterday.  States the fentanyl is not really working more so we have changed her to IV Dilaudid.  She is little bit hypoglycemic so we have added D5 into her IV fluids today.  She denies any other concerns or complaints at this time and states that she is  having diarrhea still and 3 episodes overnight and first 2 were bright red and last 1 was dark.  States that her pain is a 4 out of 10 currently.  No other concerns or questions this time.  Objective: Vitals:   07/09/22 2201 07/10/22 0022 07/10/22 0504 07/10/22 0930  BP: (!) 161/64 (!) 142/66 (!) 157/66 138/65  Pulse: 93 86 81 89  Resp: '17 16 18 18  '$ Temp: 98.7 F (37.1 C) 98.6 F (37 C) 98.7 F (37.1 C) 98.3 F (36.8 C)  TempSrc: Oral  Oral Oral  SpO2: 97% 98% 97% 98%  Weight:      Height:        Intake/Output Summary (Last 24 hours) at 07/10/2022 1311 Last data filed at 07/10/2022 0600 Gross per 24 hour  Intake 1272.78 ml  Output --  Net 1272.78 ml   Filed Weights    07/08/22 1956  Weight: 79.4 kg   Examination: Physical Exam:  Constitutional: WN/WD obese Caucasian female currently no acute distress Respiratory: Diminished to auscultation bilaterally with coarse breath sounds, no wheezing, rales, rhonchi or crackles. Normal respiratory effort and patient is not tachypenic. No accessory muscle use.  Unlabored breathing Cardiovascular: RRR, no murmurs / rubs / gallops. S1 and S2 auscultated. No extremity edema. Abdomen: Soft, tender, slightly distended secondary body habitus. Bowel sounds positive.  GU: Deferred. Musculoskeletal: No clubbing / cyanosis of digits/nails. No joint deformity upper and lower extremities.  Skin: No rashes, lesions, ulcers limited skin evaluation. No induration; Warm and dry.  Neurologic: CN 2-12 grossly intact with no focal deficits. Romberg sign and cerebellar reflexes not assessed.  Psychiatric: Normal judgment and insight. Alert and oriented x 3. Normal mood and appropriate affect.   Data Reviewed: I have personally reviewed following labs and imaging studies  CBC: Recent Labs  Lab 07/08/22 2012 07/09/22 1742 07/10/22 0407  WBC 6.6 6.9 6.6  NEUTROABS  --  3.5 3.1  HGB 13.4 12.0 11.2*  HCT 40.3 37.0 34.7*  MCV 86.5 88.1 88.5  PLT 455* 426* 892   Basic Metabolic Panel: Recent Labs  Lab 07/08/22 2012 07/09/22 1742 07/10/22 0407  NA 136 138 137  K 3.4* 3.7 3.1*  CL 103 105 108  CO2 26 22 19*  GLUCOSE 92 70 62*  BUN '8 8 7  '$ CREATININE 0.69 0.61 0.59  CALCIUM 9.0 8.4* 8.0*  MG 1.8 1.7 1.8  PHOS  --  2.9 2.8   GFR: Estimated Creatinine Clearance: 90.6 mL/min (by C-G formula based on SCr of 0.59 mg/dL). Liver Function Tests: Recent Labs  Lab 07/08/22 2012 07/09/22 1742 07/10/22 0407  AST 20 18 14*  ALT '16 12 11  '$ ALKPHOS 66 52 47  BILITOT 0.4 0.8 0.8  PROT 7.4 6.2* 5.5*  ALBUMIN 3.7 3.0* 2.8*   Recent Labs  Lab 07/08/22 2012  LIPASE 31   No results for input(s): "AMMONIA" in the last 168  hours. Coagulation Profile: No results for input(s): "INR", "PROTIME" in the last 168 hours. Cardiac Enzymes: No results for input(s): "CKTOTAL", "CKMB", "CKMBINDEX", "TROPONINI" in the last 168 hours. BNP (last 3 results) No results for input(s): "PROBNP" in the last 8760 hours. HbA1C: No results for input(s): "HGBA1C" in the last 72 hours. CBG: Recent Labs  Lab 07/10/22 0806 07/10/22 1012 07/10/22 1136  GLUCAP 56* 59* 75   Lipid Profile: No results for input(s): "CHOL", "HDL", "LDLCALC", "TRIG", "CHOLHDL", "LDLDIRECT" in the last 72 hours. Thyroid Function Tests: No results for input(s): "TSH", "T4TOTAL", "FREET4", "  T3FREE", "THYROIDAB" in the last 72 hours. Anemia Panel: No results for input(s): "VITAMINB12", "FOLATE", "FERRITIN", "TIBC", "IRON", "RETICCTPCT" in the last 72 hours. Sepsis Labs: No results for input(s): "PROCALCITON", "LATICACIDVEN" in the last 168 hours.  Recent Results (from the past 240 hour(s))  C Difficile Quick Screen w PCR reflex     Status: None   Collection Time: 07/08/22  9:39 PM   Specimen: Stool  Result Value Ref Range Status   C Diff antigen NEGATIVE NEGATIVE Final   C Diff toxin NEGATIVE NEGATIVE Final   C Diff interpretation No C. difficile detected.  Final    Comment: Performed at [facility], [place] [address] [place], [place] [address]  Culture, blood (Routine X 2) w Reflex to ID Panel     Status: None (Preliminary result)   Collection Time: 07/09/22  5:57 PM   Specimen: BLOOD RIGHT ARM  Result Value Ref Range Status   Specimen Description   Final    BLOOD RIGHT ARM Performed at [facility] [address], [place], [place] [address]    Special Requests   Final    BOTTLES DRAWN AEROBIC ONLY Blood Culture results may not be optimal due to an inadequate volume of blood received in culture bottles   Culture   Final    NO GROWTH < 24 HOURS Performed at [facility] [address] [place], [place]  [address]    Report Status PENDING  Incomplete  Culture, blood (Routine X 2) w Reflex to ID Panel     Status: None (Preliminary result)   Collection Time: 07/09/22  5:57 PM   Specimen: BLOOD RIGHT HAND  Result Value Ref Range Status   Specimen Description   Final    BLOOD RIGHT HAND Performed at [facility] [address] [place], [place] [address]    Special Requests   Final    BOTTLES DRAWN AEROBIC ONLY Blood Culture results may not be optimal due to an inadequate volume of blood received in culture bottles   Culture   Final    NO GROWTH < 24 HOURS Performed at [facility] [address], [place],  [address]    Report Status PENDING  Incomplete    Radiology Studies: CT ABDOMEN PELVIS W CONTRAST  Result Date: 07/08/2022 CLINICAL DATA:  Abdominal pain, acute, nonlocalized. Pt reports diarrhea x 5d; nausea and heartburn/reflux EXAM: CT ABDOMEN AND PELVIS WITH CONTRAST TECHNIQUE: Multidetector CT imaging of the abdomen and pelvis was performed using the standard protocol following bolus administration of intravenous contrast. RADIATION DOSE REDUCTION: This exam was performed according to the departmental dose-optimization program which includes automated exposure control, adjustment of the mA and/or kV according to patient size and/or use of iterative reconstruction technique. CONTRAST:  1102m OMNIPAQUE IOHEXOL 300 MG/ML  SOLN COMPARISON:  None Available. FINDINGS: Lower chest: No acute abnormality. Hepatobiliary: No focal liver abnormality. Status post cholecystectomy. No biliary dilatation. Pancreas: No focal lesion. Normal pancreatic contour. No surrounding inflammatory changes. No main pancreatic ductal dilatation. Spleen: Normal in size without focal abnormality. Adrenals/Urinary Tract: No adrenal nodule bilaterally. Bilateral kidneys enhance symmetrically. Subcentimeter hypodensities are too small to characterize. No hydronephrosis. No hydroureter. The  urinary bladder is unremarkable. Stomach/Bowel: Roux-en-Y gastric bypass. Stomach is within normal limits. No bowel dilatation. Long segment of small bowel within the left abdomen with bowel wall thickening. Associated small-small bowel intussusception (5:55, 2:59). Colonic submucosal hyperemia and mild bowel wall thickening and haziness  of the bowel wall of the ascending colon, transverse colon, and rectum. Appendix appears normal. Vascular/Lymphatic: No abdominal aorta or iliac aneurysm. No abdominal, pelvic, or inguinal lymphadenopathy. Reproductive: Status post hysterectomy. No adnexal masses. Other: No intraperitoneal free fluid. No intraperitoneal free gas. No organized fluid collection. Musculoskeletal: No abdominal wall hernia or abnormality. No suspicious lytic or blastic osseous lesions. No acute displaced fracture. 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 Associated severe degenerative changes of the L4-L5 level. IMPRESSION: 1. Long segment left abdomen enteritis with associated small small bowel intussusception. Ascending and transverse colitis as well as proctitis. No bowel obstruction or perforation. 2. 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 Associated severe degenerative changes of the L4-L5 level. Electronically Signed   By: Iven Finn M.D.   On: 07/08/2022 22:26    Scheduled Meds:  enoxaparin (LOVENOX) injection  40 mg Subcutaneous Q24H   iohexol       Continuous Infusions:  dextrose 5 % and 0.45 % NaCl with KCl 40 mEq/L 100 mL/hr at 07/10/22 0908   piperacillin-tazobactam (ZOSYN)  IV 3.375 g (07/10/22 0918)    LOS: 1 day   Raiford Noble, DO [facility] Available via Epic secure chat 7am-7pm After these hours, please refer to coverage provider listed on [email or web address] 07/10/2022, 1:11 PM

## 2022-07-10 NOTE — Progress Notes (Signed)
Subjective: Patient still having pain today and is actually slightly worse than yesterday.  3 more BMs since admission with 2 having a teaspoon of brighter blood and then 3rd being very dark.  No nausea or vomiting   Objective: Vital signs in last 24 hours: Temp:  [98 F (36.7 C)-98.9 F (37.2 C)] 98.7 F (37.1 C) (10/24 0504) Pulse Rate:  [81-95] 81 (10/24 0504) Resp:  [12-19] 18 (10/24 0504) BP: (126-161)/(63-74) 157/66 (10/24 0504) SpO2:  [95 %-98 %] 97 % (10/24 0504) Last BM Date : 07/10/22  Intake/Output from previous day: 10/23 0701 - 10/24 0700 In: 1272.8 [I.V.:1175.4; IV Piggyback:97.4] Out: -  Intake/Output this shift: No intake/output data recorded.  PE: Gen: NAD Heart: regular Lungs: CTAB Abd: soft, central abdominal tenderness with wincing to palpation, but no overt peritonitis or rebound.  ND  Lab Results:  Recent Labs    07/09/22 1742 07/10/22 0407  WBC 6.9 6.6  HGB 12.0 11.2*  HCT 37.0 34.7*  PLT 426* 387   BMET Recent Labs    07/09/22 1742 07/10/22 0407  NA 138 137  K 3.7 3.1*  CL 105 108  CO2 22 19*  GLUCOSE 70 62*  BUN 8 7  CREATININE 0.61 0.59  CALCIUM 8.4* 8.0*   PT/INR No results for input(s): "LABPROT", "INR" in the last 72 hours. CMP     Component Value Date/Time   NA 137 07/10/2022 0407   K 3.1 (L) 07/10/2022 0407   CL 108 07/10/2022 0407   CO2 19 (L) 07/10/2022 0407   GLUCOSE 62 (L) 07/10/2022 0407   BUN 7 07/10/2022 0407   CREATININE 0.59 07/10/2022 0407   CREATININE 0.80 10/31/2021 0844   CALCIUM 8.0 (L) 07/10/2022 0407   PROT 5.5 (L) 07/10/2022 0407   ALBUMIN 2.8 (L) 07/10/2022 0407   AST 14 (L) 07/10/2022 0407   AST 23 10/31/2021 0844   ALT 11 07/10/2022 0407   ALT 22 10/31/2021 0844   ALKPHOS 47 07/10/2022 0407   BILITOT 0.8 07/10/2022 0407   BILITOT 0.3 10/31/2021 0844   GFRNONAA >60 07/10/2022 0407   GFRNONAA >60 10/31/2021 0844   GFRAA >60 03/08/2015 0448   Lipase     Component Value Date/Time    LIPASE 31 07/08/2022 2012       Studies/Results: CT ABDOMEN PELVIS W CONTRAST  Result Date: 07/08/2022 CLINICAL DATA:  Abdominal pain, acute, nonlocalized. Pt reports diarrhea x 5d; nausea and heartburn/reflux EXAM: CT ABDOMEN AND PELVIS WITH CONTRAST TECHNIQUE: Multidetector CT imaging of the abdomen and pelvis was performed using the standard protocol following bolus administration of intravenous contrast. RADIATION DOSE REDUCTION: This exam was performed according to the departmental dose-optimization program which includes automated exposure control, adjustment of the mA and/or kV according to patient size and/or use of iterative reconstruction technique. CONTRAST:  136m OMNIPAQUE IOHEXOL 300 MG/ML  SOLN COMPARISON:  None Available. FINDINGS: Lower chest: No acute abnormality. Hepatobiliary: No focal liver abnormality. Status post cholecystectomy. No biliary dilatation. Pancreas: No focal lesion. Normal pancreatic contour. No surrounding inflammatory changes. No main pancreatic ductal dilatation. Spleen: Normal in size without focal abnormality. Adrenals/Urinary Tract: No adrenal nodule bilaterally. Bilateral kidneys enhance symmetrically. Subcentimeter hypodensities are too small to characterize. No hydronephrosis. No hydroureter. The urinary bladder is unremarkable. Stomach/Bowel: Roux-en-Y gastric bypass. Stomach is within normal limits. No bowel dilatation. Long segment of small bowel within the left abdomen with bowel wall thickening. Associated small-small bowel intussusception (5:55, 2:59). Colonic submucosal hyperemia  and mild bowel wall thickening and haziness of the bowel wall of the ascending colon, transverse colon, and rectum. Appendix appears normal. Vascular/Lymphatic: No abdominal aorta or iliac aneurysm. No abdominal, pelvic, or inguinal lymphadenopathy. Reproductive: Status post hysterectomy. No adnexal masses. Other: No intraperitoneal free fluid. No intraperitoneal free gas. No  organized fluid collection. Musculoskeletal: No abdominal wall hernia or abnormality. No suspicious lytic or blastic osseous lesions. No acute displaced fracture. 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 Associated severe degenerative changes of the L4-L5 level. IMPRESSION: 1. Long segment left abdomen enteritis with associated small small bowel intussusception. Ascending and transverse colitis as well as proctitis. No bowel obstruction or perforation. 2. 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 Associated severe degenerative changes of the L4-L5 level. Electronically Signed   By: Iven Finn M.D.   On: 07/08/2022 22:26    Anti-infectives: Anti-infectives (From admission, onward)    Start     Dose/Rate Route Frequency Ordered Stop   07/09/22 1800  piperacillin-tazobactam (ZOSYN) IVPB 3.375 g        3.375 g 12.5 mL/hr over 240 Minutes Intravenous Every 8 hours 07/09/22 1654     07/09/22 0000  piperacillin-tazobactam (ZOSYN) IVPB 3.375 g        3.375 g 100 mL/hr over 30 Minutes Intravenous  Once 07/08/22 2358 07/09/22 0042        Assessment/Plan Abdominal pain, colitis, likely transient intussusception at [place] from prior RNYGB -patient continues to have pain of unclear etiology.  Could be related to her colitis, but WBC normal, AF, not tachycardic.  Although she is having some blood in her stool.   -it is possible that intussusception remains and could be causing pain.   -will plan to repeat a CT Scan to see if her intussusception is still present.  If so, she may require surgical intervention.  If not, then would continue conservative management with abx therapy for colitis likely at this time. -discussed with primary service.   FEN - NPO/IVFs VTE - Lovenox ID - [email or web address]  I reviewed hospitalist notes, last 24 h vitals and pain scores, last 48 h intake and output, last 24 h labs and trends, and last 24 h  imaging results.   LOS: 1 day    Catherine Washington , [facility] Surgery 07/10/2022, 9:35 AM Please see Amion for pager number during day hours 7:00am-4:30pm or 7:00am -11:30am on weekends

## 2022-07-11 ENCOUNTER — Other Ambulatory Visit: Payer: Self-pay

## 2022-07-11 DIAGNOSIS — K561 Intussusception: Secondary | ICD-10-CM | POA: Diagnosis not present

## 2022-07-11 LAB — CBC WITH DIFFERENTIAL/PLATELET
Abs Immature Granulocytes: 0.2 10*3/uL — ABNORMAL HIGH (ref 0.00–0.07)
Basophils Absolute: 0.1 10*3/uL (ref 0.0–0.1)
Basophils Relative: 1 %
Eosinophils Absolute: 0.4 10*3/uL (ref 0.0–0.5)
Eosinophils Relative: 6 %
HCT: 33.9 % — ABNORMAL LOW (ref 36.0–46.0)
Hemoglobin: 11.2 g/dL — ABNORMAL LOW (ref 12.0–15.0)
Immature Granulocytes: 3 %
Lymphocytes Relative: 35 %
Lymphs Abs: 2.3 10*3/uL (ref 0.7–4.0)
MCH: 28.9 pg (ref 26.0–34.0)
MCHC: 33 g/dL (ref 30.0–36.0)
MCV: 87.6 fL (ref 80.0–100.0)
Monocytes Absolute: 0.8 10*3/uL (ref 0.1–1.0)
Monocytes Relative: 12 %
Neutro Abs: 2.9 10*3/uL (ref 1.7–7.7)
Neutrophils Relative %: 43 %
Platelets: 426 10*3/uL — ABNORMAL HIGH (ref 150–400)
RBC: 3.87 MIL/uL (ref 3.87–5.11)
RDW: 12.8 % (ref 11.5–15.5)
WBC: 6.6 10*3/uL (ref 4.0–10.5)
nRBC: 0 % (ref 0.0–0.2)

## 2022-07-11 LAB — COMPREHENSIVE METABOLIC PANEL
ALT: 12 U/L (ref 0–44)
AST: 14 U/L — ABNORMAL LOW (ref 15–41)
Albumin: 2.9 g/dL — ABNORMAL LOW (ref 3.5–5.0)
Alkaline Phosphatase: 49 U/L (ref 38–126)
Anion gap: 7 (ref 5–15)
BUN: <5 mg/dL — ABNORMAL LOW (ref 6–20)
CO2: 23 mmol/L (ref 22–32)
Calcium: 8.4 mg/dL — ABNORMAL LOW (ref 8.9–10.3)
Chloride: 106 mmol/L (ref 98–111)
Creatinine, Ser: 0.61 mg/dL (ref 0.44–1.00)
GFR, Estimated: >60 mL/min (ref >60)
Glucose, Bld: 109 mg/dL — ABNORMAL HIGH (ref 70–99)
Potassium: 3.9 mmol/L (ref 3.5–5.1)
Sodium: 136 mmol/L (ref 135–145)
Total Bilirubin: 0.4 mg/dL (ref 0.3–1.2)
Total Protein: 5.6 g/dL — ABNORMAL LOW (ref 6.5–8.1)

## 2022-07-11 LAB — MAGNESIUM: Magnesium: 1.8 mg/dL (ref 1.7–2.4)

## 2022-07-11 LAB — GLUCOSE, CAPILLARY: Glucose-Capillary: 118 mg/dL — ABNORMAL HIGH (ref 70–99)

## 2022-07-11 LAB — PHOSPHORUS: Phosphorus: 1.9 mg/dL — ABNORMAL LOW (ref 2.5–4.6)

## 2022-07-11 MED ORDER — ONDANSETRON HCL 4 MG PO TABS: 4.0000 mg | ORAL_TABLET | Freq: Three times a day (TID) | ORAL | 0 refills | Status: AC | PRN

## 2022-07-11 MED ORDER — AZITHROMYCIN 500 MG PO TABS: 500.0000 mg | ORAL_TABLET | Freq: Every day | ORAL | 0 refills | Status: AC

## 2022-07-11 NOTE — Progress Notes (Signed)
  Transition of Care [facility]) Screening Note   Patient Details  Name: Catherine Washington Date of Birth: 1975-11-09   Transition of Care [facility]) CM/SW Contact:    Roseanne Kaufman, RN Phone Number: 07/11/2022, 11:53 AM    Transition of Care Department [facility]) has reviewed patient and no TOC needs have been identified at this time. We will continue to monitor patient advancement through interdisciplinary progression rounds. If new patient transition needs arise, please place a TOC consult.

## 2022-07-11 NOTE — Plan of Care (Signed)

## 2022-07-11 NOTE — Plan of Care (Signed)
  Problem: Education: Goal: Knowledge of General Education information will improve Description: Including pain rating scale, medication(s)/side effects and non-pharmacologic comfort measures 07/11/2022 1539 by Annie Sable, RN Outcome: Completed/Met 07/11/2022 1207 by Annie Sable, RN Outcome: Adequate for Discharge   Problem: Health Behavior/Discharge Planning: Goal: Ability to manage health-related needs will improve 07/11/2022 1539 by Annie Sable, RN Outcome: Completed/Met 07/11/2022 1207 by Annie Sable, RN Outcome: Adequate for Discharge   Problem: Clinical Measurements: Goal: Ability to maintain clinical measurements within normal limits will improve 07/11/2022 1539 by Annie Sable, RN Outcome: Completed/Met 07/11/2022 1207 by Annie Sable, RN Outcome: Adequate for Discharge Goal: Will remain free from infection 07/11/2022 1539 by Annie Sable, RN Outcome: Completed/Met 07/11/2022 1207 by Annie Sable, RN Outcome: Adequate for Discharge Goal: Diagnostic test results will improve 07/11/2022 1539 by Annie Sable, RN Outcome: Completed/Met 07/11/2022 1207 by Annie Sable, RN Outcome: Adequate for Discharge Goal: Respiratory complications will improve 07/11/2022 1539 by Annie Sable, RN Outcome: Completed/Met 07/11/2022 1207 by Annie Sable, RN Outcome: Adequate for Discharge Goal: Cardiovascular complication will be avoided 07/11/2022 1539 by Annie Sable, RN Outcome: Completed/Met 07/11/2022 1207 by Annie Sable, RN Outcome: Adequate for Discharge   Problem: Activity: Goal: Risk for activity intolerance will decrease 07/11/2022 1539 by Annie Sable, RN Outcome: Completed/Met 07/11/2022 1207 by Annie Sable, RN Outcome: Adequate for Discharge   Problem: Nutrition: Goal: Adequate nutrition will be maintained 07/11/2022 1539 by Annie Sable, RN Outcome: Completed/Met 07/11/2022 1207 by Annie Sable,  RN Outcome: Adequate for Discharge   Problem: Coping: Goal: Level of anxiety will decrease 07/11/2022 1539 by Annie Sable, RN Outcome: Completed/Met 07/11/2022 1207 by Annie Sable, RN Outcome: Adequate for Discharge   Problem: Elimination: Goal: Will not experience complications related to bowel motility 07/11/2022 1539 by Annie Sable, RN Outcome: Completed/Met 07/11/2022 1207 by Annie Sable, RN Outcome: Adequate for Discharge Goal: Will not experience complications related to urinary retention 07/11/2022 1539 by Annie Sable, RN Outcome: Completed/Met 07/11/2022 1207 by Annie Sable, RN Outcome: Adequate for Discharge   Problem: Pain Managment: Goal: General experience of comfort will improve 07/11/2022 1539 by Annie Sable, RN Outcome: Completed/Met 07/11/2022 1207 by Annie Sable, RN Outcome: Adequate for Discharge   Problem: Safety: Goal: Ability to remain free from injury will improve 07/11/2022 1539 by Annie Sable, RN Outcome: Completed/Met 07/11/2022 1207 by Annie Sable, RN Outcome: Adequate for Discharge   Problem: Skin Integrity: Goal: Risk for impaired skin integrity will decrease 07/11/2022 1539 by Annie Sable, RN Outcome: Completed/Met 07/11/2022 1207 by Annie Sable, RN Outcome: Adequate for Discharge

## 2022-07-11 NOTE — Progress Notes (Signed)
Pt discharged home today per Dr. [place] Pt's IV site D/C'd and WDL. Pt's VSS. Pt provided with home medication list, discharge instructions and prescriptions. Verbalized understanding. Pt left floor via WC in stable condition accompanied by NT.

## 2022-07-11 NOTE — Discharge Summary (Signed)
Physician Discharge Summary  MAVEN ROSANDER HWE:[id number] DOB: 07-31-1976 DOA: 07/08/2022  PCP: Eulas Post, MD  Admit date: 07/08/2022 Discharge date: 07/11/2022  Admitted From: Home Disposition: Home  Recommendations for Outpatient Follow-up:  Follow up with PCP in 1-2 weeks Continue azithromycin to complete 3-day course for Campylobacter gastroenteritis  Home Health: No Equipment/Devices: None  Discharge Condition: Stable  CODE STATUS: Full code Diet recommendation: Regular diet  History of present illness:  Catherine Washington is a 46 year old female with past medical history significant for history of ulcerative colitis, depression/anxiety, hyperlipidemia, obesity s/p Roux-en-Y gastric bypass, history of laparoscopic cholecystectomy/abdominal hysterectomy who presented with 5-day history of progressive diarrhea and abdominal pain.  Patient reports tried Imodium without success.  Denied chest pain, no dizziness, no fever/chills/night sweats.  Work-up in the ED notable for long segment left abdomen enteritis with associated small bowel intussusception, ascending and transverse colitis as well as proctitis, no bowel obstruction or perforation noted on imaging.  General surgery was consulted.  Hospital service consulted for further evaluation and management.  Hospital course:  Campylobacter gastroenteritis Patient reports 5-day history of diarrhea prior to admission.  C. difficile PCR panel negative.  GI PCR are panel positive for Campylobacter; etiology likely secondary to food/water contaminant.  Given her persistent abdominal pain and diarrhea, patient was started on azithromycin.  Continue azithromycin to complete 3-day course.  Recommend supportive care, hydration and discouraged use of antidiarrheal medication.  Intussusception Imaging on admission notable for intussusception.  Patient was started on empiric antibiotics with Zosyn.  General surgery was consulted and  followed during hospital course.  Repeat CT abdomen with notable resolution of intussusception.  Likely transient intussusception at the [place] from prior Roux-en-Y gastric bypass.  Tolerating diet, no further recommendations per general surgery.  Hypokalemia Etiology likely secondary to GI loss from gastroenteritis.  Repleted during hospitalization.  Potassium 3.9 at time of discharge.  Hypoglycemia Etiology likely secondary to poor oral intake in the setting of gastroenteritis.  Supported with IV fluids.  Now resolved.  Thrombocytosis Etiology likely reactive in the setting of active infection as above.  Improved.  Anxiety/depression Continue Effexor 37.5 mg p.o. daily.  Obesity Body mass index is 30.8 kg/m.  Discussed with patient needs for aggressive lifestyle changes/weight loss as this complicates all facets of care.  Outpatient follow-up with PCP.    Discharge Diagnoses:  Principal Problem:   Intussusception [facility])    Discharge Instructions  Discharge Instructions     Call MD for:  difficulty breathing, headache or visual disturbances   Complete by: As directed    Call MD for:  extreme fatigue   Complete by: As directed    Call MD for:  persistant dizziness or light-headedness   Complete by: As directed    Call MD for:  persistant nausea and vomiting   Complete by: As directed    Call MD for:  severe uncontrolled pain   Complete by: As directed    Call MD for:  temperature >100.4   Complete by: As directed    Diet - low sodium heart healthy   Complete by: As directed    Increase activity slowly   Complete by: As directed       Allergies as of 07/11/2022       Reactions   Nitrofurantoin Monohyd Macro Nausea And Vomiting, Other (See Comments)   VIOLENT VOMITING   Tape Other (See Comments)   Blisters        Medication List  STOP taking these medications    MIRALAX PO   Wegovy 0.25 MG/0.5ML Soaj Generic drug: Semaglutide-Weight Management        TAKE these medications    azithromycin 500 MG tablet Commonly known as: Zithromax Take 1 tablet (500 mg total) by mouth daily for 1 day. Take 1 tablet daily for 3 days. Start taking on: July 12, 2022   buPROPion 100 MG tablet Commonly known as: WELLBUTRIN Take 1 tablet (100 mg total) by mouth 3 (three) times daily.   ferrous sulfate 325 (65 FE) MG tablet Take 325 mg by mouth daily with breakfast.   fexofenadine 180 MG tablet Commonly known as: ALLEGRA Take 180 mg by mouth daily.   fluticasone 50 MCG/ACT nasal spray Commonly known as: FLONASE Place 1-2 sprays into both nostrils daily as needed (for seasonal allergies).   multivitamin tablet Take 1 tablet by mouth daily with breakfast.   traZODone 50 MG tablet Commonly known as: DESYREL Take one tablet by mouth at night as needed for insomnia What changed:  how much to take how to take this when to take this additional instructions   venlafaxine XR 37.5 MG 24 hr capsule Commonly known as: EFFEXOR-XR Take 1 capsule (37.5 mg total) by mouth daily with breakfast.   vitamin B-12 500 MCG tablet Commonly known as: CYANOCOBALAMIN Take 500 mcg by mouth daily.        Follow-up Information     Burchette, Alinda Sierras, MD. Schedule an appointment as soon as possible for a visit in 1 week(s).   Specialty: Family Medicine Contact information: [place] [address] [phone]                Allergies  Allergen Reactions   Nitrofurantoin Monohyd Macro Nausea And Vomiting and Other (See Comments)    VIOLENT VOMITING   Tape Other (See Comments)    Blisters    Consultations: General Surgery   Procedures/Studies: CT ABDOMEN PELVIS W CONTRAST  Result Date: 07/10/2022 CLINICAL DATA:  Persistent abdominal pain. 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 EXAM: CT ABDOMEN AND PELVIS WITH CONTRAST TECHNIQUE: Multidetector CT imaging of the abdomen and pelvis  was performed using the standard protocol following bolus administration of intravenous contrast. RADIATION DOSE REDUCTION: This exam was performed according to the departmental dose-optimization program which includes automated exposure control, adjustment of the mA and/or kV according to patient size and/or use of iterative reconstruction technique. CONTRAST:  121m OMNIPAQUE IOHEXOL 300 MG/ML  SOLN COMPARISON:  Abdominopelvic CT 07/08/2022 FINDINGS: Despite efforts by the technologist and patient, mild motion artifact is present on today's exam and could not be eliminated. This reduces exam sensitivity and specificity. Repeat images were obtained. Intravenous contrast bolus suboptimal. Lower chest: Clear lung bases. No significant pleural or pericardial effusion. Hepatobiliary: Hepatic evaluation limited by suboptimal contrast bolus. No focal hepatic abnormality or abnormal enhancement identified. Stable mild intrahepatic biliary dilatation post cholecystectomy, likely physiologic. The extrahepatic biliary system but does not appear significantly dilated. Pancreas: Unremarkable. No pancreatic ductal dilatation or surrounding inflammatory changes. Spleen: Normal in size without focal abnormality. Adrenals/Urinary Tract: Both adrenal glands appear normal. No evidence of urinary tract calculus, suspicious renal lesion or hydronephrosis. The bladder appears unremarkable for its degree of distention. Stomach/Bowel: Positive enteric contrast is present in the distal small bowel and colon. Stable postsurgical changes from Roux-en-Y gastric bypass. The proximal small bowel is normal in caliber, without wall thickening or surrounding distension. No residual intussusception identified at the  jejunal-jejunal anastomosis. The distal small bowel appears normal. The appendix appears normal. Previously questioned colonic wall thickening appears improved, although there may be mild residual wall thickening in the descending  colon. No colonic distension or focal inflammation identified. Vascular/Lymphatic: There are no enlarged abdominal or pelvic lymph nodes. No significant vascular findings. Reproductive: Hysterectomy.  No suspicious adnexal findings. Other: No ascites or free air. No focal extraluminal fluid collection or abdominal wall hernia identified. Musculoskeletal: No acute or significant osseous findings. 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 IMPRESSION: 1. The previously demonstrated jejunal intussusception has resolved and was likely an incidental transient finding. No acute findings or explanation for the patient's symptoms. 2. Previous Roux-en-Y gastric bypass without evidence of acute inflammation or obstruction. Previously questioned mild colonic wall thickening appears improved, although there may be mild residual wall thickening in the descending colon. 3. 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 Electronically Signed   By: Richardean Sale M.D.   On: 07/10/2022 14:44   CT ABDOMEN PELVIS W CONTRAST  Result Date: 07/08/2022 CLINICAL DATA:  Abdominal pain, acute, nonlocalized. Pt reports diarrhea x 5d; nausea and heartburn/reflux EXAM: CT ABDOMEN AND PELVIS WITH CONTRAST TECHNIQUE: Multidetector CT imaging of the abdomen and pelvis was performed using the standard protocol following bolus administration of intravenous contrast. RADIATION DOSE REDUCTION: This exam was performed according to the departmental dose-optimization program which includes automated exposure control, adjustment of the mA and/or kV according to patient size and/or use of iterative reconstruction technique. CONTRAST:  170m OMNIPAQUE IOHEXOL 300 MG/ML  SOLN COMPARISON:  None Available. FINDINGS: Lower chest: No acute  abnormality. Hepatobiliary: No focal liver abnormality. Status post cholecystectomy. No biliary dilatation. Pancreas: No focal lesion. Normal pancreatic contour. No surrounding inflammatory changes. No main pancreatic ductal dilatation. Spleen: Normal in size without focal abnormality. Adrenals/Urinary Tract: No adrenal nodule bilaterally. Bilateral kidneys enhance symmetrically. Subcentimeter hypodensities are too small to characterize. No hydronephrosis. No hydroureter. The urinary bladder is unremarkable. Stomach/Bowel: Roux-en-Y gastric bypass. Stomach is within normal limits. No bowel dilatation. Long segment of small bowel within the left abdomen with bowel wall thickening. Associated small-small bowel intussusception (5:55, 2:59). Colonic submucosal hyperemia and mild bowel wall thickening and haziness of the bowel wall of the ascending colon, transverse colon, and rectum. Appendix appears normal. Vascular/Lymphatic: No abdominal aorta or iliac aneurysm. No abdominal, pelvic, or inguinal lymphadenopathy. Reproductive: Status post hysterectomy. No adnexal masses. Other: No intraperitoneal free fluid. No intraperitoneal free gas. No organized fluid collection. Musculoskeletal: No abdominal wall hernia or abnormality. No suspicious lytic or blastic osseous lesions. No acute displaced fracture. 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 Associated severe degenerative changes of the L4-L5 level. IMPRESSION: 1. Long segment left abdomen enteritis with associated small small bowel intussusception. Ascending and transverse colitis as well as proctitis. No bowel obstruction or perforation. 2. 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 Associated severe degenerative changes of the L4-L5 level. Electronically Signed   By: MIven FinnM.D.   On: 07/08/2022 22:26   MM DIAG BREAST TOMO BILATERAL  Result Date: 07/06/2022 CLINICAL  DATA:  Two year follow-up of a probable benign left breast mass, initially evaluated on 06/27/2020. EXAM: DIGITAL DIAGNOSTIC BILATERAL MAMMOGRAM WITH TOMOSYNTHESIS; ULTRASOUND LEFT BREAST LIMITED TECHNIQUE: Bilateral digital diagnostic mammography  and breast tomosynthesis was performed.; Targeted ultrasound examination of the left breast was performed. COMPARISON:  Previous exam(s). ACR Breast Density Category c: The breast tissue is heterogeneously dense, which may obscure small masses. FINDINGS: No suspicious mass, malignant type microcalcifications or distortion detected in either breast. Targeted ultrasound is performed, showing hypoechoic mass in the left breast at 3 o'clock 4 cm from the nipple measuring 0 9 x 10 x 4 mm. On the ultrasound dated 07/03/2021 it measured 9 x 7 x 3 mm. IMPRESSION: Stable benign-appearing mass in the 3 o'clock region of the left breast 4 cm from the nipple. No evidence of malignancy in either breast. RECOMMENDATION: Bilateral screening mammogram in 1 year is recommended. I have discussed the findings and recommendations with the patient. If applicable, a reminder letter will be sent to the patient regarding the next appointment. BI-RADS CATEGORY  2: Benign. Electronically Signed   By: Lillia Mountain M.D.   On: 07/06/2022 08:34  US BREAST LTD UNI LEFT INC AXILLA  Result Date: 07/06/2022 CLINICAL DATA:  Two year follow-up of a probable benign left breast mass, initially evaluated on 06/27/2020. EXAM: DIGITAL DIAGNOSTIC BILATERAL MAMMOGRAM WITH TOMOSYNTHESIS; ULTRASOUND LEFT BREAST LIMITED TECHNIQUE: Bilateral digital diagnostic mammography and breast tomosynthesis was performed.; Targeted ultrasound examination of the left breast was performed. COMPARISON:  Previous exam(s). ACR Breast Density Category c: The breast tissue is heterogeneously dense, which may obscure small masses. FINDINGS: No suspicious mass, malignant type microcalcifications or distortion detected in either  breast. Targeted ultrasound is performed, showing hypoechoic mass in the left breast at 3 o'clock 4 cm from the nipple measuring 0 9 x 10 x 4 mm. On the ultrasound dated 07/03/2021 it measured 9 x 7 x 3 mm. IMPRESSION: Stable benign-appearing mass in the 3 o'clock region of the left breast 4 cm from the nipple. No evidence of malignancy in either breast. RECOMMENDATION: Bilateral screening mammogram in 1 year is recommended. I have discussed the findings and recommendations with the patient. If applicable, a reminder letter will be sent to the patient regarding the next appointment. BI-RADS CATEGORY  2: Benign. Electronically Signed   By: Lillia Mountain M.D.   On: 07/06/2022 08:34    Subjective: Patient seen examined bedside, resting calmly.  Diarrhea improving.  Seen by general surgery this morning okay for discharge home now that intussusception has resolved on repeat CT performed yesterday.  Tolerating diet.  No other questions or concerns at this time.  Denies headache, no fever/chills/night sweats, no nauseous or vomiting, no dizziness, no chest pain, no palpitations, no shortness of breath, no current abdominal pain, no focal weakness, no fatigue, no paresthesias.  No acute events overnight per nursing staff.  Discharge Exam: Vitals:   07/10/22 2247 07/11/22 0551  BP: 137/72 127/69  Pulse: 70 67  Resp: 19 18  Temp: 98.2 F (36.8 C) 98.2 F (36.8 C)  SpO2: 100% 100%   Vitals:   07/10/22 0930 07/10/22 1447 07/10/22 2247 07/11/22 0551  BP: 138/65 139/77 137/72 127/69  Pulse: 89 86 70 67  Resp: '18 18 19 18  '$ Temp: 98.3 F (36.8 C) 99 F (37.2 C) 98.2 F (36.8 C) 98.2 F (36.8 C)  TempSrc: Oral Oral Oral Oral  SpO2: 98% 99% 100% 100%  Weight:    81.4 kg  Height:        Physical Exam: GEN: NAD, alert and oriented x 3, obese HEENT: NCAT, PERRL, EOMI, sclera clear, MMM PULM: CTAB w/o wheezes/crackles, normal respiratory effort, on  room air CV: RRR w/o M/G/R GI: abd soft, NTND, NABS,  no R/G/M MSK: no peripheral edema, muscle strength globally intact 5/5 bilateral upper/lower extremities NEURO: CN II-XII intact, no focal deficits, sensation to light touch intact PSYCH: normal mood/affect Integumentary: dry/intact, no rashes or wounds    The results of significant diagnostics from this hospitalization (including imaging, microbiology, ancillary and laboratory) are listed below for reference.     Microbiology: Recent Results (from the past 240 hour(s))  Gastrointestinal Panel by PCR , Stool     Status: Abnormal   Collection Time: 07/08/22  9:39 PM   Specimen: Stool  Result Value Ref Range Status   Campylobacter species DETECTED (A) NOT DETECTED Final    Comment: RESULT CALLED TO, READ BACK BY AND VERIFIED WITH: Mahalia Longest 07/10/22 1348 KLW    Plesimonas shigelloides NOT DETECTED NOT DETECTED Final   Salmonella species NOT DETECTED NOT DETECTED Final   Yersinia enterocolitica NOT DETECTED NOT DETECTED Final   Vibrio species NOT DETECTED NOT DETECTED Final   Vibrio cholerae NOT DETECTED NOT DETECTED Final   Enteroaggregative E coli (EAEC) NOT DETECTED NOT DETECTED Final   Enteropathogenic E coli (EPEC) NOT DETECTED NOT DETECTED Final   Enterotoxigenic E coli (ETEC) NOT DETECTED NOT DETECTED Final   Shiga like toxin producing E coli (STEC) NOT DETECTED NOT DETECTED Final   Shigella/Enteroinvasive E coli (EIEC) NOT DETECTED NOT DETECTED Final   Cryptosporidium NOT DETECTED NOT DETECTED Final   Cyclospora cayetanensis NOT DETECTED NOT DETECTED Final   Entamoeba histolytica NOT DETECTED NOT DETECTED Final   Giardia lamblia NOT DETECTED NOT DETECTED Final   Adenovirus F40/41 NOT DETECTED NOT DETECTED Final   Astrovirus NOT DETECTED NOT DETECTED Final   Norovirus GI/GII NOT DETECTED NOT DETECTED Final   Rotavirus A NOT DETECTED NOT DETECTED Final   Sapovirus (I, II, IV, and V) NOT DETECTED NOT DETECTED Final    Comment: Performed at [facility], [address] [place], [place] [address]  C Difficile Quick Screen w PCR reflex     Status: None   Collection Time: 07/08/22  9:39 PM   Specimen: Stool  Result Value Ref Range Status   C Diff antigen NEGATIVE NEGATIVE Final   C Diff toxin NEGATIVE NEGATIVE Final   C Diff interpretation No C. difficile detected.  Final    Comment: Performed at [facility], [place] [address] [place], [place] [address]  Culture, blood (Routine X 2) w Reflex to ID Panel     Status: None (Preliminary result)   Collection Time: 07/09/22  5:57 PM   Specimen: BLOOD RIGHT ARM  Result Value Ref Range Status   Specimen Description   Final    BLOOD RIGHT ARM Performed at [place] [address] [place], [place] [address]    Special Requests   Final    BOTTLES DRAWN AEROBIC ONLY Blood Culture results may not be optimal due to an inadequate volume of blood received in culture bottles   Culture   Final    NO GROWTH 2 DAYS Performed at [facility], [place] [address], [place], [place] [address]    Report Status PENDING  Incomplete  Culture, blood (Routine X 2) w Reflex to ID Panel     Status: None (Preliminary result)   Collection Time: 07/09/22  5:57 PM   Specimen: BLOOD RIGHT HAND  Result Value Ref Range Status   Specimen Description   Final  BLOOD RIGHT HAND Performed at [facility], [place] [address], [place], [place] [address]    Special Requests   Final    BOTTLES DRAWN AEROBIC ONLY Blood Culture results may not be optimal due to an inadequate volume of blood received in culture bottles   Culture   Final    NO GROWTH 2 DAYS Performed at [facility], [place] [address] [place], [place] [address]    Report Status PENDING  Incomplete     Labs: BNP (last 3 results) No results for input(s): "BNP" in the last 8760 hours. Basic Metabolic Panel: Recent Labs  Lab 07/08/22 2012 07/09/22 1742 07/10/22 0407 07/11/22 0401  NA 136 138 137 136  K 3.4*  3.7 3.1* 3.9  CL 103 105 108 106  CO2 26 22 19* 23  GLUCOSE 92 70 62* 109*  BUN '8 8 7 '$ <5*  CREATININE 0.69 0.61 0.59 0.61  CALCIUM 9.0 8.4* 8.0* 8.4*  MG 1.8 1.7 1.8 1.8  PHOS  --  2.9 2.8 1.9*   Liver Function Tests: Recent Labs  Lab 07/08/22 2012 07/09/22 1742 07/10/22 0407 07/11/22 0401  AST 20 18 14* 14*  ALT '16 12 11 12  '$ ALKPHOS 66 52 47 49  BILITOT 0.4 0.8 0.8 0.4  PROT 7.4 6.2* 5.5* 5.6*  ALBUMIN 3.7 3.0* 2.8* 2.9*   Recent Labs  Lab 07/08/22 2012  LIPASE 31   No results for input(s): "AMMONIA" in the last 168 hours. CBC: Recent Labs  Lab 07/08/22 2012 07/09/22 1742 07/10/22 0407 07/11/22 0401  WBC 6.6 6.9 6.6 6.6  NEUTROABS  --  3.5 3.1 2.9  HGB 13.4 12.0 11.2* 11.2*  HCT 40.3 37.0 34.7* 33.9*  MCV 86.5 88.1 88.5 87.6  PLT 455* 426* 387 426*   Cardiac Enzymes: No results for input(s): "CKTOTAL", "CKMB", "CKMBINDEX", "TROPONINI" in the last 168 hours. BNP: Invalid input(s): "POCBNP" CBG: Recent Labs  Lab 07/10/22 0806 07/10/22 1012 07/10/22 1136 07/11/22 0724  GLUCAP 56* 59* 75 118*   D-Dimer No results for input(s): "DDIMER" in the last 72 hours. Hgb A1c No results for input(s): "HGBA1C" in the last 72 hours. Lipid Profile No results for input(s): "CHOL", "HDL", "LDLCALC", "TRIG", "CHOLHDL", "LDLDIRECT" in the last 72 hours. Thyroid function studies No results for input(s): "TSH", "T4TOTAL", "T3FREE", "THYROIDAB" in the last 72 hours.  Invalid input(s): "FREET3" Anemia work up No results for input(s): "VITAMINB12", "FOLATE", "FERRITIN", "TIBC", "IRON", "RETICCTPCT" in the last 72 hours. Urinalysis    Component Value Date/Time   COLORURINE YELLOW 07/09/2022 0739   APPEARANCEUR HAZY (A) 07/09/2022 0739   LABSPEC >=1.030 07/09/2022 0739   PHURINE 6.0 07/09/2022 0739   GLUCOSEU NEGATIVE 07/09/2022 0739   HGBUR NEGATIVE 07/09/2022 0739   BILIRUBINUR MODERATE (A) 07/09/2022 0739   BILIRUBINUR n 10/05/2014 1015   KETONESUR >=80 (A)  07/09/2022 0739   PROTEINUR 30 (A) 07/09/2022 0739   UROBILINOGEN 0.2 10/05/2014 1015   NITRITE NEGATIVE 07/09/2022 0739   LEUKOCYTESUR NEGATIVE 07/09/2022 0739   Sepsis Labs Recent Labs  Lab 07/08/22 2012 07/09/22 1742 07/10/22 0407 07/11/22 0401  WBC 6.6 6.9 6.6 6.6   Microbiology Recent Results (from the past 240 hour(s))  Gastrointestinal Panel by PCR , Stool     Status: Abnormal   Collection Time: 07/08/22  9:39 PM   Specimen: Stool  Result Value Ref Range Status   Campylobacter species DETECTED (A) NOT DETECTED Final    Comment: RESULT CALLED TO, READ BACK BY AND  VERIFIED WITH: Mahalia Longest 07/10/22 1348 KLW    Plesimonas shigelloides NOT DETECTED NOT DETECTED Final   Salmonella species NOT DETECTED NOT DETECTED Final   Yersinia enterocolitica NOT DETECTED NOT DETECTED Final   Vibrio species NOT DETECTED NOT DETECTED Final   Vibrio cholerae NOT DETECTED NOT DETECTED Final   Enteroaggregative E coli (EAEC) NOT DETECTED NOT DETECTED Final   Enteropathogenic E coli (EPEC) NOT DETECTED NOT DETECTED Final   Enterotoxigenic E coli (ETEC) NOT DETECTED NOT DETECTED Final   Shiga like toxin producing E coli (STEC) NOT DETECTED NOT DETECTED Final   Shigella/Enteroinvasive E coli (EIEC) NOT DETECTED NOT DETECTED Final   Cryptosporidium NOT DETECTED NOT DETECTED Final   Cyclospora cayetanensis NOT DETECTED NOT DETECTED Final   Entamoeba histolytica NOT DETECTED NOT DETECTED Final   Giardia lamblia NOT DETECTED NOT DETECTED Final   Adenovirus F40/41 NOT DETECTED NOT DETECTED Final   Astrovirus NOT DETECTED NOT DETECTED Final   Norovirus GI/GII NOT DETECTED NOT DETECTED Final   Rotavirus A NOT DETECTED NOT DETECTED Final   Sapovirus (I, II, IV, and V) NOT DETECTED NOT DETECTED Final    Comment: Performed at [facility], [address] [place], [place] [address]  C Difficile Quick Screen w PCR reflex     Status: None   Collection Time: 07/08/22  9:39 PM   Specimen:  Stool  Result Value Ref Range Status   C Diff antigen NEGATIVE NEGATIVE Final   C Diff toxin NEGATIVE NEGATIVE Final   C Diff interpretation No C. difficile detected.  Final    Comment: Performed at [facility], [place] [address], [place], [place] [address]  Culture, blood (Routine X 2) w Reflex to ID Panel     Status: None (Preliminary result)   Collection Time: 07/09/22  5:57 PM   Specimen: BLOOD RIGHT ARM  Result Value Ref Range Status   Specimen Description   Final    BLOOD RIGHT ARM Performed at [place] [address], [place], [place] [address]    Special Requests   Final    BOTTLES DRAWN AEROBIC ONLY Blood Culture results may not be optimal due to an inadequate volume of blood received in culture bottles   Culture   Final    NO GROWTH 2 DAYS Performed at [facility], [place] [address] [place], [place] [address]    Report Status PENDING  Incomplete  Culture, blood (Routine X 2) w Reflex to ID Panel     Status: None (Preliminary result)   Collection Time: 07/09/22  5:57 PM   Specimen: BLOOD RIGHT HAND  Result Value Ref Range Status   Specimen Description   Final    BLOOD RIGHT HAND Performed at [facility] [address], [place], [place] [address]    Special Requests   Final    BOTTLES DRAWN AEROBIC ONLY Blood Culture results may not be optimal due to an inadequate volume of blood received in culture bottles   Culture   Final    NO GROWTH 2 DAYS Performed at [facility], [place] [address], [place], [place] [address]    Report Status PENDING  Incomplete     Time coordinating discharge: Over 30 minutes  SIGNED:   Sianni Cloninger J [place] DO  [facility] 07/11/2022, 11:39 AM

## 2022-07-11 NOTE — Plan of Care (Signed)

## 2022-07-11 NOTE — Progress Notes (Signed)
Subjective: GI pathogen + for campylobacter which is c/w diarrhea and some blood in her stool.  Her pain has improved.  She continues to have some diarrhea.  Tolerating CLD, wants to try some solid food   Objective: Vital signs in last 24 hours: Temp:  [98.2 F (36.8 C)-99 F (37.2 C)] 98.2 F (36.8 C) (10/25 0551) Pulse Rate:  [67-86] 67 (10/25 0551) Resp:  [18-19] 18 (10/25 0551) BP: (127-139)/(69-77) 127/69 (10/25 0551) SpO2:  [99 %-100 %] 100 % (10/25 0551) Weight:  [81.4 kg] 81.4 kg (10/25 0551) Last BM Date : 07/10/22  Intake/Output from previous day: 10/24 0701 - 10/25 0700 In: 2847.9 [P.O.:620; I.V.:2086.7; IV Piggyback:141.3] Out: -  Intake/Output this shift: Total I/O In: 240 [P.O.:240] Out: -   PE: Gen: NAD Abd: soft, central abdominal tenderness improved today, ND  Lab Results:  Recent Labs    07/10/22 0407 07/11/22 0401  WBC 6.6 6.6  HGB 11.2* 11.2*  HCT 34.7* 33.9*  PLT 387 426*   BMET Recent Labs    07/10/22 0407 07/11/22 0401  NA 137 136  K 3.1* 3.9  CL 108 106  CO2 19* 23  GLUCOSE 62* 109*  BUN 7 <5*  CREATININE 0.59 0.61  CALCIUM 8.0* 8.4*   PT/INR No results for input(s): "LABPROT", "INR" in the last 72 hours. CMP     Component Value Date/Time   NA 136 07/11/2022 0401   K 3.9 07/11/2022 0401   CL 106 07/11/2022 0401   CO2 23 07/11/2022 0401   GLUCOSE 109 (H) 07/11/2022 0401   BUN <5 (L) 07/11/2022 0401   CREATININE 0.61 07/11/2022 0401   CREATININE 0.80 10/31/2021 0844   CALCIUM 8.4 (L) 07/11/2022 0401   PROT 5.6 (L) 07/11/2022 0401   ALBUMIN 2.9 (L) 07/11/2022 0401   AST 14 (L) 07/11/2022 0401   AST 23 10/31/2021 0844   ALT 12 07/11/2022 0401   ALT 22 10/31/2021 0844   ALKPHOS 49 07/11/2022 0401   BILITOT 0.4 07/11/2022 0401   BILITOT 0.3 10/31/2021 0844   GFRNONAA >60 07/11/2022 0401   GFRNONAA >60 10/31/2021 0844   GFRAA >60 03/08/2015 0448   Lipase     Component Value Date/Time   LIPASE 31 07/08/2022  2012       Studies/Results: CT ABDOMEN PELVIS W CONTRAST  Result Date: 07/10/2022 CLINICAL DATA:  Persistent abdominal pain. 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 EXAM: CT ABDOMEN AND PELVIS WITH CONTRAST TECHNIQUE: Multidetector CT imaging of the abdomen and pelvis was performed using the standard protocol following bolus administration of intravenous contrast. RADIATION DOSE REDUCTION: This exam was performed according to the departmental dose-optimization program which includes automated exposure control, adjustment of the mA and/or kV according to patient size and/or use of iterative reconstruction technique. CONTRAST:  136m OMNIPAQUE IOHEXOL 300 MG/ML  SOLN COMPARISON:  Abdominopelvic CT 07/08/2022 FINDINGS: Despite efforts by the technologist and patient, mild motion artifact is present on today's exam and could not be eliminated. This reduces exam sensitivity and specificity. Repeat images were obtained. Intravenous contrast bolus suboptimal. Lower chest: Clear lung bases. No significant pleural or pericardial effusion. Hepatobiliary: Hepatic evaluation limited by suboptimal contrast bolus. No focal hepatic abnormality or abnormal enhancement identified. Stable mild intrahepatic biliary dilatation post cholecystectomy, likely physiologic. The extrahepatic biliary system but does not appear significantly dilated. Pancreas: Unremarkable. No pancreatic ductal dilatation or surrounding inflammatory changes. Spleen: Normal in size without focal abnormality. Adrenals/Urinary Tract: Both  adrenal glands appear normal. No evidence of urinary tract calculus, suspicious renal lesion or hydronephrosis. The bladder appears unremarkable for its degree of distention. Stomach/Bowel: Positive enteric contrast is present in the distal small bowel and colon. Stable postsurgical changes from Roux-en-Y gastric bypass. The proximal small bowel is normal in caliber, without wall  thickening or surrounding distension. No residual intussusception identified at the jejunal-jejunal anastomosis. The distal small bowel appears normal. The appendix appears normal. Previously questioned colonic wall thickening appears improved, although there may be mild residual wall thickening in the descending colon. No colonic distension or focal inflammation identified. Vascular/Lymphatic: There are no enlarged abdominal or pelvic lymph nodes. No significant vascular findings. Reproductive: Hysterectomy.  No suspicious adnexal findings. Other: No ascites or free air. No focal extraluminal fluid collection or abdominal wall hernia identified. Musculoskeletal: No acute or significant osseous findings. 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 IMPRESSION: 1. The previously demonstrated jejunal intussusception has resolved and was likely an incidental transient finding. No acute findings or explanation for the patient's symptoms. 2. Previous Roux-en-Y gastric bypass without evidence of acute inflammation or obstruction. Previously questioned mild colonic wall thickening appears improved, although there may be mild residual wall thickening in the descending colon. 3. 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 Electronically Signed   By: Richardean Sale M.D.   On: 07/10/2022 14:44    Anti-infectives: Anti-infectives (From admission, onward)    Start     Dose/Rate Route Frequency Ordered Stop   07/10/22 1630  azithromycin (ZITHROMAX) tablet 500 mg        500 mg Oral Daily 07/10/22 1543 07/13/22 0959   07/09/22 1800  piperacillin-tazobactam (ZOSYN) IVPB 3.375 g        3.375 g 12.5 mL/hr over 240 Minutes Intravenous Every 8 hours 07/09/22 1654     07/09/22 0000  piperacillin-tazobactam  (ZOSYN) IVPB 3.375 g        3.375 g 100 mL/hr over 30 Minutes Intravenous  Once 07/08/22 2358 07/09/22 0042        Assessment/Plan Abdominal pain, colitis, likely transient intussusception at [place] from prior RNYGB, with GE with campylobacter -CT scan with no acute findings yesterday -GI pathogen panel returned with campylobacter c/w food poisoning essentially.  Medicine is treating this due to her symptoms, but should be self-limiting -diet as she is able to tolerate -no surgical indications -home as per primary service.  FEN - regular VTE - Lovenox ID - zosyn, but can likely stop this, azithromycin  I reviewed hospitalist notes, last 24 h vitals and pain scores, last 48 h intake and output, last 24 h labs and trends, and last 24 h imaging results.   LOS: 2 days    Henreitta Cea , [facility] 07/11/2022, 10:09 AM Please see Amion for pager number during day hours 7:00am-4:30pm or 7:00am -11:30am on weekends

## 2022-07-12 ENCOUNTER — Telehealth: Payer: Self-pay

## 2022-07-12 NOTE — Telephone Encounter (Signed)
Transition Care Management Follow-up Telephone Call Date of discharge and from where: [facility] 07-11-22 Dx: Intussesception How have you been since you were released from the hospital? Doing ok  Any questions or concerns? No  Items Reviewed: Did the pt receive and understand the discharge instructions provided? Yes  Medications obtained and verified? Yes  Other? No  Any new allergies since your discharge? No  Dietary orders reviewed? Yes Do you have support at home? Yes   Home Care and Equipment/Supplies: Were home health services ordered? no If so, what is the name of the agency? na Has the agency set up a time to come to the patient's home? not applicable Were any new equipment or medical supplies ordered?  No What is the name of the medical supply agency? na Were you able to get the supplies/equipment? not applicable Do you have any questions related to the use of the equipment or supplies? No  Functional Questionnaire: (I = Independent and D = Dependent) ADLs: I  Bathing/Dressing- I  Meal Prep- I  Eating- I  Maintaining continence- I  Transferring/Ambulation- I  Managing Meds- I  Follow up appointments reviewed:  PCP Hospital f/u appt confirmed? Yes  Scheduled to see Dr Elease Hashimoto on 07-20-22 @ [facility] f/u appt confirmed? Yes  Scheduled to see Lottie Dawson NP on 08-14-22 @ 830am . Are transportation arrangements needed? No  If their condition worsens, is the pt aware to call PCP or go to the Emergency Dept.? Yes Was the patient provided with contact information for the PCP's office or ED? Yes Was to pt encouraged to call back with questions or concerns? Yes   Juanda Crumble LPN [facility] [phone]

## 2022-07-14 LAB — CULTURE, BLOOD (ROUTINE X 2)
Culture: NO GROWTH
Culture: NO GROWTH

## 2022-07-16 ENCOUNTER — Encounter: Payer: Self-pay | Admitting: Family Medicine

## 2022-07-16 ENCOUNTER — Encounter (HOSPITAL_BASED_OUTPATIENT_CLINIC_OR_DEPARTMENT_OTHER): Payer: Self-pay

## 2022-07-16 ENCOUNTER — Ambulatory Visit (HOSPITAL_BASED_OUTPATIENT_CLINIC_OR_DEPARTMENT_OTHER)
Admission: RE | Admit: 2022-07-16 | Discharge: 2022-07-16 | Disposition: A | Discharge status: Home / Self Care | Payer: BC Managed Care – PPO | Source: Ambulatory Visit | Attending: Family Medicine | Admitting: Family Medicine

## 2022-07-16 ENCOUNTER — Ambulatory Visit: Payer: BC Managed Care – PPO | Admitting: Family Medicine

## 2022-07-16 VITALS — BP 126/80 | HR 90 | Temp 98.2°F | Ht 64.0 in | Wt 176.9 lb

## 2022-07-16 DIAGNOSIS — K561 Intussusception: Secondary | ICD-10-CM | POA: Diagnosis not present

## 2022-07-16 DIAGNOSIS — R1013 Epigastric pain: Secondary | ICD-10-CM

## 2022-07-16 MED ORDER — IOHEXOL 300 MG/ML  SOLN
100.0000 mL | Freq: Once | INTRAMUSCULAR | Status: AC | PRN
  Administered 2022-07-16: 85 mL via INTRAVENOUS

## 2022-07-16 NOTE — Progress Notes (Signed)
Established Patient Office Visit  Subjective   Patient ID: Catherine Washington, female    DOB: 08/09/1976  Age: 46 y.o. MRN: [id number]  Chief Complaint  Patient presents with   Hospitalization Follow-up    HPI   Catherine Washington is seen for hospital follow-up.  She is having recurrent upper abdominal pain over the past couple days.  She was recently admitted on the 22nd through the 25th with intussusception and positive stool culture for Campylobacter. She was initially scheduled for hospital follow-up this Friday but she rescheduled for today because of worsening upper abdominal pain.  Recent hospital notes reviewed.  She does have history of Roux-en-Y gastric bypass several years ago and has also had previous laparoscopic cholecystectomy and abdominal hysterectomy.  She had presented with 5-day history of diarrhea and abdominal pain.  She had tried Imodium without relief.  No fever.  CT in the ER showed evidence for left abdominal enteritis with small bowel intussusception ascending and transverse colitis as well as proctitis.  No bowel obstruction or perforation noted.  General surgery consult noted and recommended conservative management with fluids and bowel rest.  Her culture grew out Campylobacter.  Blood cultures negative.  Stool negative for C. difficile.  No clear source for Campylobacter.  She was treated with Zosyn and then Zithromax at discharge.  She has had no recurrent diarrhea.  No fever.  Worsening abdominal pain especially past 24 hours and the exact same location she had with prior intussusception.  She has been eating very light the past couple days.  Past Medical History:  Diagnosis Date   Allergy    Anxiety    Chicken pox    Depression    Family history of adverse reaction to anesthesia    mother gets very nausaus   Family history of breast cancer    Family history of breast cancer    Family history of kidney cancer    Family history of melanoma    Family history of ovarian  cancer    Family history of thyroid cancer    Headache    occasionally   Hyperlipidemia    Ulcerative colitis ([place])    UTI (urinary tract infection)    Past Surgical History:  Procedure Laterality Date   ABDOMINAL HYSTERECTOMY  2006   dysfunctional bleeding   BREATH TEK H PYLORI N/A 12/06/2014   Procedure: BREATH TEK H PYLORI;  Surgeon: Greer Pickerel, MD;  Location: [facility];  Service: General;  Laterality: N/A;   childbirth  1995, 2003   CHOLECYSTECTOMY     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   Procedure: LAPAROSCOPIC ROUX-EN-Y GASTRIC BYPASS WITH UPPER ENDOSCOPY;  Surgeon: Greer Pickerel, MD;  Location: [facility];  Service: General;  Laterality: N/A;    reports that she has never smoked. She has never used smokeless tobacco. She reports that she does not drink alcohol and does not use drugs. family history includes Breast cancer in her maternal aunt; Cancer (age of onset: 74) in her maternal aunt; Diabetes in her father, maternal grandmother, mother, and paternal grandfather; Heart disease (age of onset: 37) in her father; Hyperlipidemia in her father and mother; Hypertension in her father; Kidney cancer in her maternal grandmother; Lung cancer in her paternal grandmother; Melanoma in her father and maternal grandmother; Mental illness in her mother; Ovarian cancer in her paternal grandmother; Stroke in her paternal grandmother; Thyroid cancer (age of onset: 72) in her father. Allergies  Allergen Reactions   Nitrofurantoin Monohyd Macro Nausea And Vomiting  and Other (See Comments)    VIOLENT VOMITING   Tape Other (See Comments)    Blisters    Review of Systems  Constitutional:  Negative for chills and fever.  Respiratory:  Negative for cough and shortness of breath.   Cardiovascular:  Negative for chest pain.  Gastrointestinal:  Positive for abdominal pain.      Objective:     BP 126/80 (BP Location: Left Arm, Patient Position: Sitting, Cuff Size: Large)   Pulse 90   Temp 98.2 F  (36.8 C) (Oral)   Ht '5\' 4"'$  (1.626 m)   Wt 176 lb 14.4 oz (80.2 kg)   SpO2 98%   BMI 30.36 kg/m  BP Readings from Last 3 Encounters:  07/16/22 126/80  07/11/22 132/62  05/08/22 136/80   Wt Readings from Last 3 Encounters:  07/16/22 176 lb 14.4 oz (80.2 kg)  07/11/22 179 lb 7.3 oz (81.4 kg)  05/08/22 182 lb 6.4 oz (82.7 kg)      Physical Exam Vitals reviewed.  Constitutional:      Appearance: Normal appearance.  Cardiovascular:     Rate and Rhythm: Normal rate and regular rhythm.  Pulmonary:     Effort: Pulmonary effort is normal.     Breath sounds: Normal breath sounds.  Abdominal:     General: There is no distension.     Palpations: Abdomen is soft.     Tenderness: There is abdominal tenderness.     Comments: Normal bowel sounds.  No distention.  She does have tenderness with some mild guarding epigastric region and left upper quadrant.  Neurological:     Mental Status: She is alert.      No results found for any visits on 07/16/22.    The 10-year ASCVD risk score (Arnett DK, et al., 2019) is: 0.3%    Assessment & Plan:   Problem List Items Addressed This Visit   None Visit Diagnoses     Abdominal pain, epigastric    -  Primary   Relevant Orders   CT Abdomen Pelvis W Contrast     Patient has history of recent intussusception of the small bowel in the setting of remote history of Roux-en-Y gastric surgery.  She also had positive culture for Campylobacter.  Was improving until couple days ago when she started having recurrent abdominal pain which is worsening in severity.  No fever.  No bloody stools.  -Set up repeat CT scan abdomen pelvis stat with contrast -Depending on CT results may need more urgent consultation with surgery  No follow-ups on file.    Carolann Littler, MD

## 2022-07-20 ENCOUNTER — Ambulatory Visit: Payer: BC Managed Care – PPO | Admitting: Family Medicine

## 2022-08-05 ENCOUNTER — Encounter: Payer: Self-pay | Admitting: Family Medicine

## 2022-08-05 DIAGNOSIS — G43909 Migraine, unspecified, not intractable, without status migrainosus: Secondary | ICD-10-CM

## 2022-08-14 ENCOUNTER — Inpatient Hospital Stay: Payer: BC Managed Care – PPO | Attending: Hematology & Oncology

## 2022-08-14 ENCOUNTER — Inpatient Hospital Stay: Payer: BC Managed Care – PPO | Admitting: Family

## 2022-09-03 ENCOUNTER — Telehealth: Payer: BC Managed Care – PPO | Admitting: Family Medicine

## 2022-09-03 DIAGNOSIS — T148XXA Other injury of unspecified body region, initial encounter: Secondary | ICD-10-CM

## 2022-09-03 NOTE — Addendum Note (Signed)
Addended by: Perlie Mayo on: 09/03/2022 08:49 AM   Modules accepted: Level of Service

## 2022-09-03 NOTE — Progress Notes (Signed)
Animal bite- needs tetanus updated in addition to treatment.

## 2022-09-04 ENCOUNTER — Encounter: Payer: Self-pay | Admitting: Family Medicine

## 2022-09-04 ENCOUNTER — Ambulatory Visit (INDEPENDENT_AMBULATORY_CARE_PROVIDER_SITE_OTHER): Payer: BC Managed Care – PPO | Admitting: Family Medicine

## 2022-09-04 VITALS — BP 136/86 | HR 85 | Temp 97.7°F | Ht 64.0 in

## 2022-09-04 DIAGNOSIS — S61052A Open bite of left thumb without damage to nail, initial encounter: Secondary | ICD-10-CM | POA: Diagnosis not present

## 2022-09-04 DIAGNOSIS — Z23 Encounter for immunization: Secondary | ICD-10-CM | POA: Diagnosis not present

## 2022-09-04 DIAGNOSIS — W540XXA Bitten by dog, initial encounter: Secondary | ICD-10-CM | POA: Diagnosis not present

## 2022-09-04 MED ORDER — AMOXICILLIN-POT CLAVULANATE 875-125 MG PO TABS: 1.0000 | ORAL_TABLET | Freq: Two times a day (BID) | ORAL | 0 refills | Status: DC

## 2022-09-04 NOTE — Addendum Note (Signed)
Addended by: Nilda Riggs on: 09/04/2022 09:43 AM   Modules accepted: Orders

## 2022-09-04 NOTE — Progress Notes (Signed)
Established Patient Office Visit  Subjective   Patient ID: Catherine Washington, female    DOB: [birth year]  Age: 46 y.o. MRN: [id number]  Chief Complaint  Patient presents with   Animal Bite    X3 days,     HPI   Catherine Washington is seen with dog bite left thumb which occurred Saturday.  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  The dog that bit her came up to her wagging tail but then bit her face and barely broke the skin on her left cheek but also punctured the left thumb.  She noticed increased erythema yesterday and swelling.  She shows a picture on her phone which showed substantial erythema yesterday but improved today.  No fevers or chills.  No drainage.  Last tetanus 2013.  Dog did receive vaccines including rabies vaccine earlier this month.  Dog is under 10 days of strict quarantine and observation  Past Medical History:  Diagnosis Date   Allergy    Anxiety    Chicken pox    Depression    Family history of adverse reaction to anesthesia    mother gets very nausaus   Family history of breast cancer    Family history of breast cancer    Family history of kidney cancer    Family history of melanoma    Family history of ovarian cancer    Family history of thyroid cancer    Headache    occasionally   Hyperlipidemia    Ulcerative colitis ([place])    UTI (urinary tract infection)    Past Surgical History:  Procedure Laterality Date   ABDOMINAL HYSTERECTOMY  2006   dysfunctional bleeding   BREATH TEK H PYLORI N/A 12/06/2014   Procedure: BREATH TEK H PYLORI;  Surgeon: Greer Pickerel, MD;  Location: [facility];  Service: General;  Laterality: N/A;   childbirth  1995, 2003   CHOLECYSTECTOMY     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   Procedure: LAPAROSCOPIC ROUX-EN-Y GASTRIC BYPASS WITH UPPER ENDOSCOPY;  Surgeon: Greer Pickerel, MD;  Location: [facility];  Service: General;  Laterality: N/A;    reports that she has never smoked. She has never used smokeless tobacco. She reports that  she does not drink alcohol and does not use drugs. family history includes Breast cancer in her maternal aunt; Cancer (age of onset: 63) in her maternal aunt; Diabetes in her father, maternal grandmother, mother, and paternal grandfather; Heart disease (age of onset: 8) in her father; Hyperlipidemia in her father and mother; Hypertension in her father; Kidney cancer in her maternal grandmother; Lung cancer in her paternal grandmother; Melanoma in her father and maternal grandmother; Mental illness in her mother; Ovarian cancer in her paternal grandmother; Stroke in her paternal grandmother; Thyroid cancer (age of onset: 86) in her father. Allergies  Allergen Reactions   Nitrofurantoin Monohyd Macro Nausea And Vomiting and Other (See Comments)    VIOLENT VOMITING   Tape Other (See Comments)    Blisters    Review of Systems  Constitutional:  Negative for chills and fever.      Objective:     BP 136/86 (BP Location: Left Arm, Patient Position: Sitting, Cuff Size: Large)   Pulse 85   Temp 97.7 F (36.5 C) (Oral)   Ht '5\' 4"'$  (1.626 m)   SpO2 99%   BMI 30.36 kg/m    Physical Exam Vitals reviewed.  Constitutional:      Appearance: Normal appearance.  Cardiovascular:  Rate and Rhythm: Normal rate and regular rhythm.  Skin:    Comments: Left thumb reveals couple superficial puncture wounds volar surface distal pulp.  No cellulitis changes.  No warmth.  No drainage.  Minimal swelling.  No fluctuance  Neurological:     Mental Status: She is alert.      No results found for any visits on 09/04/22.    The 10-year ASCVD risk score (Arnett DK, et al., 2019) is: 0.4%    Assessment & Plan:   Problem List Items Addressed This Visit   None Visit Diagnoses     Dog bite of left thumb, initial encounter    -  Primary     -Dog bite left thumb which occurred 3 days ago.  Dog has received vaccines.  Dog is under strict observation. -Picture from yesterday suggested possibly early  cellulitis changes but improved today -Low threshold to start Augmentin 875 mg twice daily for 10 days for any recurrent erythema or warmth or swelling -Tetanus booster given  No follow-ups on file.    Carolann Littler, MD

## 2022-09-19 ENCOUNTER — Telehealth: Payer: BC Managed Care – PPO | Admitting: Family

## 2022-09-19 DIAGNOSIS — B9689 Other specified bacterial agents as the cause of diseases classified elsewhere: Secondary | ICD-10-CM

## 2022-09-19 DIAGNOSIS — J208 Acute bronchitis due to other specified organisms: Secondary | ICD-10-CM | POA: Diagnosis not present

## 2022-09-19 MED ORDER — BENZONATATE 100 MG PO CAPS: 100.0000 mg | ORAL_CAPSULE | Freq: Three times a day (TID) | ORAL | 0 refills | Status: DC | PRN

## 2022-09-19 MED ORDER — AZITHROMYCIN 250 MG PO TABS: ORAL_TABLET | ORAL | 0 refills | Status: DC

## 2022-09-19 NOTE — Progress Notes (Signed)
We are sorry that you are not feeling well.  Here is how we plan to help!  Based on your presentation I believe you most likely have A cough due to bacteria.  When patients have a fever and a productive cough with a change in color or increased sputum production, we are concerned about bacterial bronchitis.  If left untreated it can progress to pneumonia.  If your symptoms do not improve with your treatment plan it is important that you contact your provider.   I have prescribed Azithromyin 250 mg: two tablets now and then one tablet daily for 4 additonal days    In addition you may use A non-prescription cough medication called Robitussin DAC. Take 2 teaspoons every 8 hours or Delsym: take 2 teaspoons every 12 hours., A non-prescription cough medication called Mucinex DM: take 2 tablets every 12 hours., and A prescription cough medication called Tessalon Perles 100mg. You may take 1-2 capsules every 8 hours as needed for your cough.    From your responses in the eVisit questionnaire you describe inflammation in the upper respiratory tract which is causing a significant cough.  This is commonly called Bronchitis and has four common causes:   Allergies Viral Infections Acid Reflux Bacterial Infection Allergies, viruses and acid reflux are treated by controlling symptoms or eliminating the cause. An example might be a cough caused by taking certain blood pressure medications. You stop the cough by changing the medication. Another example might be a cough caused by acid reflux. Controlling the reflux helps control the cough.  USE OF BRONCHODILATOR ("RESCUE") INHALERS: There is a risk from using your bronchodilator too frequently.  The risk is that over-reliance on a medication which only relaxes the muscles surrounding the breathing tubes can reduce the effectiveness of medications prescribed to reduce swelling and congestion of the tubes themselves.  Although you feel brief relief from the  bronchodilator inhaler, your asthma may actually be worsening with the tubes becoming more swollen and filled with mucus.  This can delay other crucial treatments, such as oral steroid medications. If you need to use a bronchodilator inhaler daily, several times per day, you should discuss this with your provider.  There are probably better treatments that could be used to keep your asthma under control.     HOME CARE Only take medications as instructed by your medical team. Complete the entire course of an antibiotic. Drink plenty of fluids and get plenty of rest. Avoid close contacts especially the very young and the elderly Cover your mouth if you cough or cough into your sleeve. Always remember to wash your hands A steam or ultrasonic humidifier can help congestion.   GET HELP RIGHT AWAY IF: You develop worsening fever. You become short of breath You cough up blood. Your symptoms persist after you have completed your treatment plan MAKE SURE YOU  Understand these instructions. Will watch your condition. Will get help right away if you are not doing well or get worse.    Thank you for choosing an e-visit.  Your e-visit answers were reviewed by a board certified advanced clinical practitioner to complete your personal care plan. Depending upon the condition, your plan could have included both over the counter or prescription medications.  Please review your pharmacy choice. Make sure the pharmacy is open so you can pick up prescription now. If there is a problem, you may contact your provider through MyChart messaging and have the prescription routed to another pharmacy.  Your safety is   important to us. If you have drug allergies check your prescription carefully.   For the next 24 hours you can use MyChart to ask questions about today's visit, request a non-urgent call back, or ask for a work or school excuse. You will get an email in the next two days asking about your experience. I  hope that your e-visit has been valuable and will speed your recovery.   Approximately 5 minutes was spent documenting and reviewing patient's chart.   

## 2022-10-02 NOTE — Progress Notes (Signed)
Referring:  Eulas Post, MD [place],  [place] [address]  PCP: Eulas Post, MD  Neurology was asked to evaluate Catherine Washington, a 47 year old female for a chief complaint of headaches.  Our recommendations of care will be communicated by shared medical record.    CC:  headaches  History provided from self  HPI:  Medical co-morbidities: ulcerative colitis, HLD, depression, s/p gastric bypass  The patient presents for evaluation of headaches which began several years ago. They have increased in frequency over the past year. She currently has 3 migraines per month. Migraines are associated with photophobia, phonophobia, and nausea. They can last up to 2 days at a time. She takes Tylenol or Aleve as needed which is minimally effective. Previously tried Imitrex, but this caused chest pain so she stopped it. She takes Effexor for her mood and has otherwise not taken a preventive medication for migraines.   Headache History: Onset: several years ago Triggers: oreos Aura: no Associated Symptoms:  Photophobia: yes  Phonophobia: yes  Nausea: yes Worse with activity?: yes Duration of headaches: 2 days  Migraine days per month: 6 Headache free days per month: 24  Current Treatment: Abortive Tylenol Aleve  Preventative none  Prior Therapies                                 Tylenol Ibuprofen Imitrex - chest pain Effexor 37.5 mg daily  LABS: CBC    Component Value Date/Time   WBC 6.6 07/11/2022 0401   RBC 3.87 07/11/2022 0401   HGB 11.2 (L) 07/11/2022 0401   HGB 12.6 04/13/2022 0819   HCT 33.9 (L) 07/11/2022 0401   PLT 426 (H) 07/11/2022 0401   PLT 373 04/13/2022 0819   MCV 87.6 07/11/2022 0401   MCH 28.9 07/11/2022 0401   MCHC 33.0 07/11/2022 0401   RDW 12.8 07/11/2022 0401   LYMPHSABS 2.3 07/11/2022 0401   MONOABS 0.8 07/11/2022 0401   EOSABS 0.4 07/11/2022 0401   BASOSABS 0.1 07/11/2022 0401      Latest Ref Rng & Units 07/11/2022     4:01 AM 07/10/2022    4:07 AM 07/09/2022    5:42 PM  CMP  Glucose 70 - 99 mg/dL 109  62  70   BUN 6 - 20 mg/dL '5  7  8   '$ Creatinine 0.44 - 1.00 mg/dL 0.61  0.59  0.61   Sodium 135 - 145 mmol/L 136  137  138   Potassium 3.5 - 5.1 mmol/L 3.9  3.1  3.7   Chloride 98 - 111 mmol/L 106  108  105   CO2 22 - 32 mmol/L '23  19  22   '$ Calcium 8.9 - 10.3 mg/dL 8.4  8.0  8.4   Total Protein 6.5 - 8.1 g/dL 5.6  5.5  6.2   Total Bilirubin 0.3 - 1.2 mg/dL 0.4  0.8  0.8   Alkaline Phos 38 - 126 U/L 49  47  52   AST 15 - 41 U/L '14  14  18   '$ ALT 0 - 44 U/L '12  11  12     '$ IMAGING:  none  Current Outpatient Medications on File Prior to Visit  Medication Sig Dispense Refill   Acetaminophen (TYLENOL PO) Take by mouth.     buPROPion (WELLBUTRIN) 100 MG tablet Take 1 tablet (100 mg total) by mouth 3 (three) times daily.  270 tablet 3   ferrous sulfate 325 (65 FE) MG tablet Take 325 mg by mouth daily with breakfast.     fexofenadine (ALLEGRA) 180 MG tablet Take 180 mg by mouth daily.     fluticasone (FLONASE) 50 MCG/ACT nasal spray Place 1-2 sprays into both nostrils daily as needed (for seasonal allergies).     Multiple Vitamin (MULTIVITAMIN) tablet Take 1 tablet by mouth daily with breakfast.     traZODone (DESYREL) 50 MG tablet Take one tablet by mouth at night as needed for insomnia (Patient taking differently: Take 50 mg by mouth at bedtime.) 90 tablet 3   venlafaxine XR (EFFEXOR-XR) 37.5 MG 24 hr capsule Take 1 capsule (37.5 mg total) by mouth daily with breakfast. 90 capsule 3   vitamin B-12 (CYANOCOBALAMIN) 500 MCG tablet Take 500 mcg by mouth daily.     amoxicillin-clavulanate (AUGMENTIN) 875-125 MG tablet Take 1 tablet by mouth 2 (two) times daily. (Patient not taking: Reported on 10/03/2022) 20 tablet 0   azithromycin (ZITHROMAX) 250 MG tablet Take 500 mg once, then 250 mg for four days (Patient not taking: Reported on 10/03/2022) 6 tablet 0   benzonatate (TESSALON PERLES) 100 MG capsule Take 1  capsule (100 mg total) by mouth 3 (three) times daily as needed. (Patient not taking: Reported on 10/03/2022) 20 capsule 0   No current facility-administered medications on file prior to visit.     Allergies: Allergies  Allergen Reactions   Nitrofurantoin Monohyd Macro Nausea And Vomiting and Other (See Comments)    VIOLENT VOMITING   Tape Other (See Comments)    Blisters    Family History: Migraine or other headaches in the family:  son has NDPH and chronic migraine Aneurysms in a first degree relative:  no Brain tumors in the family:  no Other neurological illness in the family:   no  Past Medical History: Past Medical History:  Diagnosis Date   Allergy    Anxiety    Chicken pox    Depression    Family history of adverse reaction to anesthesia    mother gets very nausaus   Family history of breast cancer    Family history of breast cancer    Family history of kidney cancer    Family history of melanoma    Family history of ovarian cancer    Family history of thyroid cancer    Headache    occasionally   Hyperlipidemia    Ulcerative colitis ([place])    UTI (urinary tract infection)     Past Surgical History Past Surgical History:  Procedure Laterality Date   ABDOMINAL HYSTERECTOMY  2006   dysfunctional bleeding   BREATH TEK H PYLORI N/A 12/06/2014   Procedure: BREATH TEK H PYLORI;  Surgeon: Greer Pickerel, MD;  Location: [facility];  Service: General;  Laterality: N/A;   childbirth  1995, 2003   CHOLECYSTECTOMY     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   Procedure: LAPAROSCOPIC ROUX-EN-Y GASTRIC BYPASS WITH UPPER ENDOSCOPY;  Surgeon: Greer Pickerel, MD;  Location: [facility];  Service: General;  Laterality: N/A;    Social History: Social History   Tobacco Use   Smoking status: Never   Smokeless tobacco: Never  Vaping Use   Vaping Use: Never used  Substance Use Topics   Alcohol use: No   Drug use: No    ROS: Negative for fevers, chills. Positive for headaches. All  other systems reviewed and negative unless stated otherwise in HPI.   Physical Exam:  Vital Signs: BP 124/74 (BP Location: Right Arm, Patient Position: Sitting, Cuff Size: Normal)   Pulse 88   Ht '5\' 4"'$  (1.626 m)   Wt 182 lb (82.6 kg)   BMI 31.24 kg/m  GENERAL: well appearing,in no acute distress,alert SKIN:  Color, texture, turgor normal. No rashes or lesions HEAD:  Normocephalic/atraumatic. CV:  RRR RESP: Normal respiratory effort MSK: no tenderness to palpation over occiput, neck, or shoulders  NEUROLOGICAL: Mental Status: Alert, oriented to person, place and time,Follows commands Cranial Nerves: PERRL, visual fields intact to confrontation, extraocular movements intact, facial sensation intact, no facial droop or ptosis, hearing grossly intact, no dysarthria Motor: muscle strength 5/5 both upper and lower extremities,no drift, normal tone Reflexes: 2+ throughout Sensation: intact to light touch all 4 extremities Coordination: Finger-to- nose-finger intact bilaterally Gait: normal-based   IMPRESSION: 47 year old female with a history of ulcerative colitis, HLD, depression, s/p gastric bypass who presents for evaluation of migraines. Neurological exam today is normal. She would like to start a preventive medication today. Discussed preventive options, and she would like to start Topamax for migraine prevention. Will start naratriptan for rescue as this may be better tolerated than Imitrex.  PLAN: -Prevention: Start Topamax 25 mg QHS, increase by 25 mg weekly up to 100 mg QHS -Rescue: Start naratriptan 2.5 mg PRN -Next steps: consider increasing Effexor or propranolol (patient would prefer to avoid this if possible as her son had side effects with propranolol) for prevention  I spent a total of 18 minutes chart reviewing and counseling the patient. Headache education was done. Discussed treatment options including preventive and acute medications. Discussed medication overuse  headache and to limit use of acute treatments to no more than 2 days/week or 10 days/month. Discussed medication side effects, adverse reactions and drug interactions. Written educational materials and patient instructions outlining all of the above were given.  Follow-up: 6 months   Genia Harold, MD 10/03/2022   9:52 AM

## 2022-10-03 ENCOUNTER — Encounter: Payer: Self-pay | Admitting: Psychiatry

## 2022-10-03 ENCOUNTER — Ambulatory Visit: Payer: BC Managed Care – PPO | Admitting: Psychiatry

## 2022-10-03 VITALS — BP 124/74 | HR 88 | Ht 64.0 in | Wt 182.0 lb

## 2022-10-03 DIAGNOSIS — G43009 Migraine without aura, not intractable, without status migrainosus: Secondary | ICD-10-CM

## 2022-10-03 MED ORDER — NARATRIPTAN HCL 2.5 MG PO TABS: 2.5000 mg | ORAL_TABLET | ORAL | 6 refills | Status: DC | PRN

## 2022-10-03 MED ORDER — TOPIRAMATE 100 MG PO TABS: 100.0000 mg | ORAL_TABLET | Freq: Every day | ORAL | 6 refills | Status: DC

## 2022-10-03 MED ORDER — TOPIRAMATE 25 MG PO TABS: ORAL_TABLET | ORAL | 0 refills | Status: DC

## 2022-12-31 ENCOUNTER — Encounter: Payer: Self-pay | Admitting: Family Medicine

## 2023-01-01 ENCOUNTER — Other Ambulatory Visit: Payer: Self-pay | Admitting: Family

## 2023-01-24 ENCOUNTER — Telehealth: Payer: BC Managed Care – PPO | Admitting: Physician Assistant

## 2023-01-24 DIAGNOSIS — B9689 Other specified bacterial agents as the cause of diseases classified elsewhere: Secondary | ICD-10-CM | POA: Diagnosis not present

## 2023-01-24 DIAGNOSIS — J019 Acute sinusitis, unspecified: Secondary | ICD-10-CM | POA: Diagnosis not present

## 2023-01-24 MED ORDER — AMOXICILLIN-POT CLAVULANATE 875-125 MG PO TABS: 1.0000 | ORAL_TABLET | Freq: Two times a day (BID) | ORAL | 0 refills | Status: DC

## 2023-01-24 NOTE — Progress Notes (Signed)

## 2023-01-24 NOTE — Progress Notes (Signed)
I have spent 5 minutes in review of e-visit questionnaire, review and updating patient chart, medical decision making and response to patient.   Tsuyako Jolley Cody Unity Luepke, PA-C    

## 2023-01-30 ENCOUNTER — Telehealth (INDEPENDENT_AMBULATORY_CARE_PROVIDER_SITE_OTHER): Payer: BC Managed Care – PPO | Admitting: Family Medicine

## 2023-01-30 ENCOUNTER — Encounter: Payer: Self-pay | Admitting: Family Medicine

## 2023-01-30 ENCOUNTER — Other Ambulatory Visit (HOSPITAL_BASED_OUTPATIENT_CLINIC_OR_DEPARTMENT_OTHER): Payer: Self-pay

## 2023-01-30 VITALS — HR 88 | Ht 64.0 in

## 2023-01-30 DIAGNOSIS — R509 Fever, unspecified: Secondary | ICD-10-CM | POA: Diagnosis not present

## 2023-01-30 DIAGNOSIS — J0140 Acute pansinusitis, unspecified: Secondary | ICD-10-CM | POA: Diagnosis not present

## 2023-01-30 MED ORDER — ZEPBOUND 5 MG/0.5ML ~~LOC~~ SOAJ
5.0000 mg | SUBCUTANEOUS | 0 refills | Status: DC
  Filled 2023-01-30: qty 2, 28d supply, fill #0

## 2023-01-30 MED ORDER — LEVOFLOXACIN 500 MG PO TABS: 500.0000 mg | ORAL_TABLET | Freq: Every day | ORAL | 0 refills | Status: AC

## 2023-01-30 NOTE — Progress Notes (Addendum)
Virtual Visit via Video Note I connected with Catherine Washington on 02/01/23 by a video enabled telemedicine application and verified that I am speaking with the correct person using two identifiers. Location patient: home Location provider:work office Persons participating in the virtual visit: patient, provider  I discussed the limitations of evaluation and management by telemedicine and the availability of in person appointments. The patient expressed understanding and agreed to proceed.  Chief Complaint  Patient presents with   Sinus Problem   HPI: [name] is a 47 yo female with PMHx significant for depression,anxiety,and seasonal allergies  c/o persistent symptoms of bacterial sinusitis, despite being on Augmentin for six days.  Evaluated for similar symptoms on 01/24/23, which she has had for 2.5 weeks now.  She reports persistent light frontal headache and facial pressure pain,nasal congestion, and yellow nasal discharge. Additionally, she mentions mild sore throat and fever that has fluctuated between 99.5 and 100.3 Ft for approximately ten days.  She describes feeling "run down" and tired, estimating a 30-40% improvement in her condition since starting antibiotics.  She denies any visual changes,photophobia, nausea, vomiting, cough, wheezing, difficulty breathing, abdominal pain, or diarrhea. However, she does report some neck pain and stiffness.  Negative for numbness,tingling,or focal deficit.  She confirms alternating Tylenol and Ibuprofen for pain and fever management.  She also mentions that her husband has been ill with different symptoms, and both have tested negative for COVID-19 at home.  ROS: See pertinent positives and negatives per HPI.  Past Medical History:  Diagnosis Date   Allergy    Anxiety    Chicken pox    Depression    Family history of adverse reaction to anesthesia    mother gets very nausaus   Family history of breast cancer    Family history of breast  cancer    Family history of kidney cancer    Family history of melanoma    Family history of ovarian cancer    Family history of thyroid cancer    Headache    occasionally   Hyperlipidemia    Ulcerative colitis ([facility])    UTI (urinary tract infection)     Past Surgical History:  Procedure Laterality Date   ABDOMINAL HYSTERECTOMY  2006   dysfunctional bleeding   BREATH TEK H PYLORI N/A 12/06/2014   Procedure: BREATH TEK H PYLORI;  Surgeon: Gaynelle Adu, MD;  Location: [facility];  Service: General;  Laterality: N/A;   childbirth  1995, 2003   CHOLECYSTECTOMY     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   Procedure: LAPAROSCOPIC ROUX-EN-Y GASTRIC BYPASS WITH UPPER ENDOSCOPY;  Surgeon: Gaynelle Adu, MD;  Location: [facility];  Service: General;  Laterality: N/A;    Family History  Problem Relation Age of Onset   Hyperlipidemia Mother    Mental illness Mother    Diabetes Mother        type 2 diabetes   Hyperlipidemia Father    Heart disease Father 26       MI   Hypertension Father    Diabetes Father    Thyroid cancer Father 70   Melanoma Father    Diabetes Maternal Grandmother    Kidney cancer Maternal Grandmother    Melanoma Maternal Grandmother    Ovarian cancer Paternal Grandmother        dx late 53s   Stroke Paternal Grandmother    Lung cancer Paternal Grandmother        dx 30s, hx smoking   Diabetes Paternal Grandfather  Breast cancer Maternal Aunt        diagonsed x2, first in 77s   Cancer Maternal Aunt 64       metastatic; lung, liver, panc, spleen   Colon cancer Neg Hx    Rectal cancer Neg Hx    Esophageal cancer Neg Hx     Social History   Socioeconomic History   Marital status: Married    Spouse name: Not on file   Number of children: 2   Years of education: Not on file   Highest education level: Not on file  Occupational History   Not on file  Tobacco Use   Smoking status: Never   Smokeless tobacco: Never  Vaping Use   Vaping Use: Never used  Substance  and Sexual Activity   Alcohol use: No   Drug use: No   Sexual activity: Not on file  Other Topics Concern   Not on file  Social History Narrative   Has 2 children  10 and 15 non smoker    Social Determinants of Health   Financial Resource Strain: Not on file  Food Insecurity: No Food Insecurity (07/09/2022)   Hunger Vital Sign    Worried About Running Out of Food in the Last Year: Never true    Ran Out of Food in the Last Year: Never true  Transportation Needs: No Transportation Needs (07/11/2022)   PRAPARE - Administrator, Civil Service (Medical): No    Lack of Transportation (Non-Medical): No  Physical Activity: Not on file  Stress: Not on file  Social Connections: Not on file  Intimate Partner Violence: Not At Risk (07/11/2022)   Humiliation, Afraid, Rape, and Kick questionnaire    Fear of Current or Ex-Partner: No    Emotionally Abused: No    Physically Abused: No    Sexually Abused: No     Current Outpatient Medications:    Acetaminophen (TYLENOL PO), Take by mouth., Disp: , Rfl:    amoxicillin-clavulanate (AUGMENTIN) 875-125 MG tablet, Take 1 tablet by mouth 2 (two) times daily., Disp: 14 tablet, Rfl: 0   buPROPion (WELLBUTRIN) 100 MG tablet, Take 1 tablet (100 mg total) by mouth 3 (three) times daily., Disp: 270 tablet, Rfl: 3   ferrous sulfate 325 (65 FE) MG tablet, Take 325 mg by mouth daily with breakfast., Disp: , Rfl:    fexofenadine (ALLEGRA) 180 MG tablet, Take 180 mg by mouth daily., Disp: , Rfl:    fluticasone (FLONASE) 50 MCG/ACT nasal spray, Place 1-2 sprays into both nostrils daily as needed (for seasonal allergies)., Disp: , Rfl:    Multiple Vitamin (MULTIVITAMIN) tablet, Take 1 tablet by mouth daily with breakfast., Disp: , Rfl:    naratriptan (AMERGE) 2.5 MG tablet, Take 1 tablet (2.5 mg total) by mouth as needed for migraine. Take one (1) tablet at onset of headache; if returns or does not resolve, may repeat after 4 hours; do not exceed five  (5) mg in 24 hours., Disp: 10 tablet, Rfl: 6   tirzepatide (ZEPBOUND) 5 MG/0.5ML Pen, Inject 5 mg into the skin once a week., Disp: 2 mL, Rfl: 0   topiramate (TOPAMAX) 100 MG tablet, Take 1 tablet (100 mg total) by mouth at bedtime., Disp: 30 tablet, Rfl: 6   traZODone (DESYREL) 50 MG tablet, Take one tablet by mouth at night as needed for insomnia (Patient taking differently: Take 50 mg by mouth at bedtime.), Disp: 90 tablet, Rfl: 3   venlafaxine XR (EFFEXOR-XR) 37.5 MG 24  hr capsule, Take 1 capsule (37.5 mg total) by mouth daily with breakfast., Disp: 90 capsule, Rfl: 3   vitamin B-12 (CYANOCOBALAMIN) 500 MCG tablet, Take 500 mcg by mouth daily., Disp: , Rfl:    topiramate (TOPAMAX) 25 MG tablet, Take 1 tablet (25 mg total) by mouth at bedtime for 7 days, THEN 2 tablets (50 mg total) at bedtime for 7 days, THEN 3 tablets (75 mg total) at bedtime for 7 days, THEN 4 tablets (100 mg total) at bedtime for 7 days., Disp: 70 tablet, Rfl: 0  EXAM:  VITALS per patient if applicable:Pulse 88   Ht 5\' 4"  (1.626 m)   SpO2 98%   BMI 31.24 kg/m   GENERAL: alert, oriented, appears well and in no acute distress  HEENT: atraumatic, conjunctiva clear, no obvious abnormalities on inspection of external nose and ears  NECK: normal movements of the head and neck  LUNGS: on inspection no signs of respiratory distress, breathing rate appears normal, no obvious gross SOB, gasping or wheezing  CV: no obvious cyanosis  MS: moves all visible extremities without noticeable abnormality  PSYCH/NEURO: pleasant and cooperative, no obvious depression,+anxious. Speech and thought processing grossly intact  ASSESSMENT AND PLAN:  Discussed the following assessment and plan:  Fever, unspecified fever cause We discussed possible etiologies, having 100.3 F for about 10 days, viral illness and rheumatologic disorders need to be consider if problem does not resolved. Continue monitoring temp and alternating between  Tylenol and Ibuprofen. On video she does not seem to be in acute distress, monitor for new symptoms. Instructed about warning signs.  Acute non-recurrent pansinusitis Reporting 30-40% improvement of symptoms. Symptoms have been going on for 2.5 weeks and 10 days of fever. Recommend stopping Augmentin and trying levofloxacin. We reviewed side effects of new abx, including GI,neuropathy,achilles tendon rupture among some. Because she has been on Augmentin, recommend starting a probiotic. If symptoms are persistent, she needs to follow in the office.  -     levoFLOXacin; Take 1 tablet (500 mg total) by mouth daily for 7 days.  Dispense: 7 tablet; Refill: 0  We discussed possible serious and likely etiologies, options for evaluation and workup, limitations of telemedicine visit vs in person visit, treatment, treatment risks and precautions. The patient was advised to call back or seek an in-person evaluation if the symptoms worsen or if the condition fails to improve as anticipated. I discussed the assessment and treatment plan with the patient. The patient was provided an opportunity to ask questions and all were answered. The patient agreed with the plan and demonstrated an understanding of the instructions.  Return in about 1 week (around 02/06/2023), or if symptoms worsen or fail to improve.  Kourtney Montesinos G. [place], MD  [facility] [place] office.

## 2023-01-30 NOTE — Telephone Encounter (Addendum)
Patient has been scheduled for virtual visit with Dr. [place] today

## 2023-01-31 ENCOUNTER — Encounter: Payer: Self-pay | Admitting: Family Medicine

## 2023-02-20 ENCOUNTER — Other Ambulatory Visit (HOSPITAL_BASED_OUTPATIENT_CLINIC_OR_DEPARTMENT_OTHER): Payer: Self-pay

## 2023-02-20 MED ORDER — ZEPBOUND 5 MG/0.5ML ~~LOC~~ SOAJ
5.0000 mg | SUBCUTANEOUS | 0 refills | Status: DC
  Filled 2023-02-20: qty 2, 28d supply, fill #0

## 2023-02-21 ENCOUNTER — Other Ambulatory Visit (HOSPITAL_BASED_OUTPATIENT_CLINIC_OR_DEPARTMENT_OTHER): Payer: Self-pay

## 2023-03-27 ENCOUNTER — Other Ambulatory Visit (HOSPITAL_BASED_OUTPATIENT_CLINIC_OR_DEPARTMENT_OTHER): Payer: Self-pay

## 2023-03-27 MED ORDER — ZEPBOUND 5 MG/0.5ML ~~LOC~~ SOAJ
5.0000 mg | SUBCUTANEOUS | 0 refills | Status: DC
  Filled 2023-03-27: qty 2, 28d supply, fill #0

## 2023-04-30 NOTE — Progress Notes (Unsigned)
   CC:  headaches  Follow-up Visit  Last visit: 10/03/22  Brief HPI: 47 year old female with a history of ulcerative colitis, HLD, depression, s/p gastric bypass who follows in clinic for migraines.  At her last visit she was started on Topamax for prevention and naratriptan for rescue. Interval History: ***   Headache days per month: *** Migraine days per month*** Headache free days per month: ***  Current Headache Regimen: Preventative: *** Abortive: ***   Prior Therapies                                  Rescue: Tylenol Ibuprofen Imitrex - chest pain  Preventive: Effexor 37.5 mg daily  Physical Exam:   Vital Signs: There were no vitals taken for this visit. GENERAL:  well appearing, in no acute distress, alert  SKIN:  Color, texture, turgor normal. No rashes or lesions HEAD:  Normocephalic/atraumatic. RESP: normal respiratory effort MSK:  No gross joint deformities.   NEUROLOGICAL: Mental Status: Alert, oriented to person, place and time, Follows commands, and Speech fluent and appropriate. Cranial Nerves: PERRL, face symmetric, no dysarthria, hearing grossly intact Motor: moves all extremities equally Gait: normal-based.  IMPRESSION: ***  PLAN: ***   Follow-up: ***  I spent a total of *** minutes on the date of the service. Headache education was done. Discussed lifestyle modification including increased oral hydration, decreased caffeine, exercise and stress management. Discussed treatment options including preventive and acute medications, natural supplements, and infusion therapy. Discussed medication overuse headache and to limit use of acute treatments to no more than 2 days/week or 10 days/month. Discussed medication side effects, adverse reactions and drug interactions. Written educational materials and patient instructions outlining all of the above were given.  Ocie Doyne, MD

## 2023-05-01 ENCOUNTER — Ambulatory Visit: Payer: BC Managed Care – PPO | Admitting: Psychiatry

## 2023-05-01 ENCOUNTER — Encounter: Payer: Self-pay | Admitting: Psychiatry

## 2023-05-01 VITALS — BP 125/80 | HR 88 | Ht 64.0 in | Wt 141.0 lb

## 2023-05-01 DIAGNOSIS — G43009 Migraine without aura, not intractable, without status migrainosus: Secondary | ICD-10-CM

## 2023-05-01 MED ORDER — TOPIRAMATE 100 MG PO TABS: 100.0000 mg | ORAL_TABLET | Freq: Every day | ORAL | 6 refills | Status: DC

## 2023-05-16 ENCOUNTER — Other Ambulatory Visit: Payer: Self-pay | Admitting: Family Medicine

## 2023-05-16 DIAGNOSIS — F339 Major depressive disorder, recurrent, unspecified: Secondary | ICD-10-CM

## 2023-08-05 ENCOUNTER — Other Ambulatory Visit: Payer: Self-pay | Admitting: Family Medicine

## 2023-08-05 DIAGNOSIS — Z1231 Encounter for screening mammogram for malignant neoplasm of breast: Secondary | ICD-10-CM

## 2023-08-14 ENCOUNTER — Other Ambulatory Visit: Payer: Self-pay | Admitting: Family Medicine

## 2023-08-14 ENCOUNTER — Telehealth: Payer: BC Managed Care – PPO | Admitting: Nurse Practitioner

## 2023-08-14 DIAGNOSIS — F339 Major depressive disorder, recurrent, unspecified: Secondary | ICD-10-CM

## 2023-08-14 DIAGNOSIS — J0110 Acute frontal sinusitis, unspecified: Secondary | ICD-10-CM

## 2023-08-14 MED ORDER — AMOXICILLIN-POT CLAVULANATE 875-125 MG PO TABS: 1.0000 | ORAL_TABLET | Freq: Two times a day (BID) | ORAL | 0 refills | Status: AC

## 2023-08-14 NOTE — Progress Notes (Signed)

## 2023-08-28 ENCOUNTER — Ambulatory Visit
Admission: RE | Admit: 2023-08-28 | Discharge: 2023-08-28 | Disposition: A | Discharge status: Home / Self Care | Payer: BC Managed Care – PPO | Source: Ambulatory Visit | Attending: Family Medicine | Admitting: Family Medicine

## 2023-08-28 DIAGNOSIS — Z1231 Encounter for screening mammogram for malignant neoplasm of breast: Secondary | ICD-10-CM

## 2023-10-31 NOTE — Progress Notes (Addendum)
 CC:  headaches  Follow-up Visit  Last visit: 05/01/2023 with Dr. Delena Bali  Brief HPI: 48 year old female with a history of ulcerative colitis, HLD, depression, s/p gastric bypass who follows in clinic for migraines.  At her last visit, migraines well-controlled, topiramate continued and recommended use of naratriptan for rescue    Interval History:  Migraines remain well-controlled, experiences about 1 migraine per month with ongoing use of topiramate.  Use of naratriptan with benefit as long as she takes as soon as migraines starts.   Patient also mentions right hand digits 3-4 numbness over the past 3-4 months, will wake up with right forearm pain and finger numbness with burning sensation. Will gradually improve but has persistent finger tip numbness. Does have some neck pain/stiffness especially on right side, feels she needs to crack her neck.  No weakness. No injury prior to symptom onset.    Migraine days per month: 1 Headache free days per month: 29  Current Headache Regimen: Preventative: Topamax 100 mg QHS Abortive: naratriptan 2.5 mg PRN   Prior Therapies                                  Rescue: Tylenol Ibuprofen Imitrex - chest pain Naratriptan  Preventive: Effexor 37.5 mg daily Topiramate 100 mg nightly   ROS:   14 system review of systems performed and negative with exception of those listed in HPI    Outpatient Encounter Medications as of 11/04/2023  Medication Sig   Acetaminophen (TYLENOL PO) Take by mouth.   buPROPion (WELLBUTRIN) 100 MG tablet TAKE 1 TABLET BY MOUTH 3 TIMES DAILY.   fexofenadine (ALLEGRA) 180 MG tablet Take 180 mg by mouth daily.   fluticasone (FLONASE) 50 MCG/ACT nasal spray Place 1-2 sprays into both nostrils daily as needed (for seasonal allergies).   Multiple Vitamin (MULTIVITAMIN) tablet Take 1 tablet by mouth daily with breakfast.   venlafaxine XR (EFFEXOR-XR) 37.5 MG 24 hr capsule TAKE 1 CAPSULE BY MOUTH DAILY WITH  BREAKFAST.   [DISCONTINUED] naratriptan (AMERGE) 2.5 MG tablet Take 1 tablet (2.5 mg total) by mouth as needed for migraine. Take one (1) tablet at onset of headache; if returns or does not resolve, may repeat after 4 hours; do not exceed five (5) mg in 24 hours.   [DISCONTINUED] topiramate (TOPAMAX) 100 MG tablet Take 1 tablet (100 mg total) by mouth at bedtime.   ferrous sulfate 325 (65 FE) MG tablet Take 325 mg by mouth daily with breakfast.   naratriptan (AMERGE) 2.5 MG tablet Take 1 tablet (2.5 mg total) by mouth as needed for migraine. Take one (1) tablet at onset of headache; if returns or does not resolve, may repeat after 4 hours; do not exceed five (5) mg in 24 hours.   topiramate (TOPAMAX) 100 MG tablet Take 1 tablet (100 mg total) by mouth at bedtime.   vitamin B-12 (CYANOCOBALAMIN) 500 MCG tablet Take 500 mcg by mouth daily.   No facility-administered encounter medications on file as of 11/04/2023.    Past Medical History:  Diagnosis Date   Allergy    Anxiety    Chicken pox    Depression    Family history of adverse reaction to anesthesia    mother gets very nausaus   Family history of breast cancer    Family history of breast cancer    Family history of kidney cancer    Family history of melanoma  Family history of ovarian cancer    Family history of thyroid cancer    Headache    occasionally   Hyperlipidemia    Ulcerative colitis (HCC)    UTI (urinary tract infection)    Past Surgical History:  Procedure Laterality Date   ABDOMINAL HYSTERECTOMY  2006   dysfunctional bleeding   BREATH TEK H PYLORI N/A 12/06/2014   Procedure: BREATH TEK H PYLORI;  Surgeon: Gaynelle Adu, MD;  Location: [facility];  Service: General;  Laterality: N/A;   childbirth  1995, 2003   CHOLECYSTECTOMY     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   Procedure: LAPAROSCOPIC ROUX-EN-Y GASTRIC BYPASS WITH UPPER ENDOSCOPY;  Surgeon: Gaynelle Adu, MD;  Location: [facility];  Service: General;  Laterality: N/A;       Physical Exam:   Vital Signs: BP 112/75 (BP Location: Left Arm, Patient Position: Sitting, Cuff Size: Normal)   Pulse 97   Ht 5\' 4"  (1.626 m)   Wt 148 lb 12.8 oz (67.5 kg)   BMI 25.54 kg/m  GENERAL:  well appearing, in no acute distress, alert  SKIN:  Color, texture, turgor normal. No rashes or lesions HEAD:  Normocephalic/atraumatic. RESP: normal respiratory effort MSK:  No gross joint deformities.   NEUROLOGICAL: Mental Status: Alert, oriented to person, place and time, Follows commands, and Speech fluent and appropriate. Cranial Nerves: PERRL, face symmetric, no dysarthria, hearing grossly intact Motor: moves all extremities equally without evidence of weakness Sensation: Decreased pinprick sensation on all fingers bilaterally until mid palm, light touch intact, position sensation intact Gait: normal-based.    IMPRESSION: 48 year old female with a history of ulcerative colitis, HLD, depression, s/p gastric bypass who presents for follow up of migraines. Headaches have improved with Topamax for prevention.  She also reports 3 to 46-month onset of right hand finger numbness and burning sensation (digits 3-4) and right forearm pain (burning sensation), she does have mild neck pain primarily on right side   PLAN:  1.  Migraine -Prevention: Continue Topamax 100 mg at bedtime -Rescue: Continue naratriptan 2.5 mg PRN   2.  Right hand paresthesias -unclear cause -recommend completing MRI C-spine for neck pain with radiculopathy possibly at C7/C8 -if c-spine imaging unremarkable could consider pursuing EMG/NCV to rule out compressive etiology -Possibly side effect of topiramate but lower suspicion currently as she has been on this medication over the past year and symptoms localized, if above workup unremarkable, could consider lowering dosage to see if symptoms improve    Follow-up in 1 year for migraine follow up or call earlier if needed Sooner appointment may be  needed for follow up of right hand symptoms      I spent 35 minutes of face-to-face and non-face-to-face time with patient.  This included previsit chart review, lab review, study review, order entry, electronic health record documentation, patient education and discussion regarding above diagnoses and treatment plan and answered all other questions to patient's satisfaction  Ihor Austin, [facility]  [facility] [address] [place], [place] [address]  Phone [phone] Fax [phone] Note: This document was prepared with digital dictation and possible smart phrase technology. Any transcriptional errors that result from this process are unintentional.

## 2023-11-04 ENCOUNTER — Encounter: Payer: Self-pay | Admitting: Adult Health

## 2023-11-04 ENCOUNTER — Ambulatory Visit (INDEPENDENT_AMBULATORY_CARE_PROVIDER_SITE_OTHER): Payer: Self-pay | Admitting: Adult Health

## 2023-11-04 VITALS — BP 112/75 | HR 97 | Ht 64.0 in | Wt 148.8 lb

## 2023-11-04 DIAGNOSIS — G43009 Migraine without aura, not intractable, without status migrainosus: Secondary | ICD-10-CM | POA: Diagnosis not present

## 2023-11-04 DIAGNOSIS — M542 Cervicalgia: Secondary | ICD-10-CM | POA: Diagnosis not present

## 2023-11-04 DIAGNOSIS — R200 Anesthesia of skin: Secondary | ICD-10-CM | POA: Diagnosis not present

## 2023-11-04 DIAGNOSIS — R202 Paresthesia of skin: Secondary | ICD-10-CM | POA: Diagnosis not present

## 2023-11-04 MED ORDER — TOPIRAMATE 100 MG PO TABS: 100.0000 mg | ORAL_TABLET | Freq: Every day | ORAL | 3 refills | Status: DC

## 2023-11-04 MED ORDER — NARATRIPTAN HCL 2.5 MG PO TABS: 2.5000 mg | ORAL_TABLET | ORAL | 11 refills | Status: AC | PRN

## 2023-11-04 NOTE — Patient Instructions (Signed)
 You will be called to complete an MRI cervical imaging   If this looks normal and symptoms persist, we can consider doing EMG/NCV for further evaluation   Continue topamax 100mg  nightly   Continue naratriptan as needed      Follow up in 1 year or call earlier if needed

## 2023-11-05 ENCOUNTER — Ambulatory Visit: Payer: 59

## 2023-11-05 DIAGNOSIS — R202 Paresthesia of skin: Secondary | ICD-10-CM | POA: Diagnosis not present

## 2023-11-05 DIAGNOSIS — M542 Cervicalgia: Secondary | ICD-10-CM

## 2023-11-05 DIAGNOSIS — R200 Anesthesia of skin: Secondary | ICD-10-CM | POA: Diagnosis not present

## 2023-11-07 ENCOUNTER — Encounter: Payer: Self-pay | Admitting: Adult Health

## 2023-11-07 DIAGNOSIS — R200 Anesthesia of skin: Secondary | ICD-10-CM

## 2023-11-08 ENCOUNTER — Other Ambulatory Visit: Payer: Self-pay | Admitting: Family Medicine

## 2023-11-08 DIAGNOSIS — F339 Major depressive disorder, recurrent, unspecified: Secondary | ICD-10-CM

## 2023-11-11 DIAGNOSIS — R200 Anesthesia of skin: Secondary | ICD-10-CM | POA: Insufficient documentation

## 2023-11-11 NOTE — Telephone Encounter (Signed)
 Orders Placed This Encounter  Procedures   NCV with EMG(electromyography)      Ok to order EMG/NCS

## 2023-11-11 NOTE — Telephone Encounter (Signed)
 Patient c/o right hand middle two finger numbness, exam showed sensory impairment. MR c-spine largely unremarkable. Patient is wanting to pursue EMG/NCV. Is this okay to order? Thank you!

## 2023-11-12 NOTE — Telephone Encounter (Signed)
 LVM and sent mychart msg asking patient to call back to schedule NCV/EMG

## 2023-12-17 ENCOUNTER — Other Ambulatory Visit: Payer: Self-pay | Admitting: Family Medicine

## 2023-12-17 DIAGNOSIS — F339 Major depressive disorder, recurrent, unspecified: Secondary | ICD-10-CM

## 2023-12-18 ENCOUNTER — Other Ambulatory Visit: Payer: Self-pay | Admitting: Family Medicine

## 2023-12-18 DIAGNOSIS — F339 Major depressive disorder, recurrent, unspecified: Secondary | ICD-10-CM

## 2024-01-08 ENCOUNTER — Telehealth: Payer: Self-pay | Admitting: Adult Health

## 2024-01-08 NOTE — Telephone Encounter (Signed)
 Pt has called to schedule her NCV/EMG, she was informed the order is not in the system for it.  Pt is asking to be called regarding if the order will be placed or if other options are being looked into for her.

## 2024-01-08 NOTE — Telephone Encounter (Signed)
 LVM and sent mychart msg asking pt to call back to schedule NCV/EMG

## 2024-01-14 ENCOUNTER — Encounter: Payer: Self-pay | Admitting: Family Medicine

## 2024-01-14 ENCOUNTER — Ambulatory Visit (INDEPENDENT_AMBULATORY_CARE_PROVIDER_SITE_OTHER): Payer: Self-pay | Admitting: Family Medicine

## 2024-01-14 VITALS — BP 120/82 | HR 100 | Temp 97.7°F | Ht 64.0 in | Wt 123.5 lb

## 2024-01-14 DIAGNOSIS — R634 Abnormal weight loss: Secondary | ICD-10-CM

## 2024-01-14 DIAGNOSIS — Z9884 Bariatric surgery status: Secondary | ICD-10-CM

## 2024-01-14 DIAGNOSIS — F339 Major depressive disorder, recurrent, unspecified: Secondary | ICD-10-CM

## 2024-01-14 DIAGNOSIS — Z8249 Family history of ischemic heart disease and other diseases of the circulatory system: Secondary | ICD-10-CM

## 2024-01-14 DIAGNOSIS — Z0000 Encounter for general adult medical examination without abnormal findings: Secondary | ICD-10-CM | POA: Diagnosis not present

## 2024-01-14 DIAGNOSIS — D509 Iron deficiency anemia, unspecified: Secondary | ICD-10-CM

## 2024-01-14 LAB — CBC WITH DIFFERENTIAL/PLATELET
Basophils Absolute: 0 10*3/uL (ref 0.0–0.1)
Basophils Relative: 0.9 % (ref 0.0–3.0)
Eosinophils Absolute: 0.2 10*3/uL (ref 0.0–0.7)
Eosinophils Relative: 3.4 % (ref 0.0–5.0)
HCT: 38.6 % (ref 36.0–46.0)
Hemoglobin: 12.9 g/dL (ref 12.0–15.0)
Lymphocytes Relative: 38 % (ref 12.0–46.0)
Lymphs Abs: 1.7 10*3/uL (ref 0.7–4.0)
MCHC: 33.5 g/dL (ref 30.0–36.0)
MCV: 85.8 fl (ref 78.0–100.0)
Monocytes Absolute: 0.3 10*3/uL (ref 0.1–1.0)
Monocytes Relative: 7.3 % (ref 3.0–12.0)
Neutro Abs: 2.2 10*3/uL (ref 1.4–7.7)
Neutrophils Relative %: 50.4 % (ref 43.0–77.0)
Platelets: 413 10*3/uL — ABNORMAL HIGH (ref 150.0–400.0)
RBC: 4.5 Mil/uL (ref 3.87–5.11)
RDW: 15.5 % (ref 11.5–15.5)
WBC: 4.4 10*3/uL (ref 4.0–10.5)

## 2024-01-14 LAB — COMPREHENSIVE METABOLIC PANEL WITH GFR
ALT: 14 U/L (ref 0–35)
AST: 15 U/L (ref 0–37)
Albumin: 4.4 g/dL (ref 3.5–5.2)
Alkaline Phosphatase: 37 U/L — ABNORMAL LOW (ref 39–117)
BUN: 17 mg/dL (ref 6–23)
CO2: 28 meq/L (ref 19–32)
Calcium: 9.7 mg/dL (ref 8.4–10.5)
Chloride: 106 meq/L (ref 96–112)
Creatinine, Ser: 0.86 mg/dL (ref 0.40–1.20)
GFR: 80.48 mL/min (ref >60.00)
Glucose, Bld: 85 mg/dL (ref 70–99)
Potassium: 3.7 meq/L (ref 3.5–5.1)
Sodium: 140 meq/L (ref 135–145)
Total Bilirubin: 0.4 mg/dL (ref 0.2–1.2)
Total Protein: 6.8 g/dL (ref 6.0–8.3)

## 2024-01-14 LAB — LIPID PANEL
Cholesterol: 131 mg/dL (ref 0–200)
HDL: 60.4 mg/dL (ref >39.00)
LDL Cholesterol: 58 mg/dL (ref 0–99)
NonHDL: 70.55
Total CHOL/HDL Ratio: 2
Triglycerides: 61 mg/dL (ref 0.0–149.0)
VLDL: 12.2 mg/dL (ref 0.0–40.0)

## 2024-01-14 LAB — VITAMIN B12: Vitamin B-12: 326 pg/mL (ref 211–911)

## 2024-01-14 LAB — HEMOGLOBIN A1C: Hgb A1c MFr Bld: 5.7 % (ref 4.6–6.5)

## 2024-01-14 LAB — TSH: TSH: 2.2 u[IU]/mL (ref 0.35–5.50)

## 2024-01-14 LAB — VITAMIN D 25 HYDROXY (VIT D DEFICIENCY, FRACTURES): VITD: 48.35 ng/mL (ref 30.00–100.00)

## 2024-01-14 MED ORDER — VENLAFAXINE HCL ER 37.5 MG PO CP24: 37.5000 mg | ORAL_CAPSULE | Freq: Every day | ORAL | 3 refills | Status: AC

## 2024-01-14 MED ORDER — BUPROPION HCL 100 MG PO TABS: 100.0000 mg | ORAL_TABLET | Freq: Three times a day (TID) | ORAL | 3 refills | Status: DC
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
## 2024-01-15 NOTE — Telephone Encounter (Signed)
 Spoke with patient further.  Labs overall reassuring but she has had substantial unintentional weight loss.  Colonoscopy up to date.  EGD 2023.  Mammogram up to date.   No chronic cough or dyspnea.  Discussed getting CT abdomen and pelvis and she agrees.  Order placed.  Marquetta Sit MD [facility]

## 2024-01-17 ENCOUNTER — Ambulatory Visit (HOSPITAL_BASED_OUTPATIENT_CLINIC_OR_DEPARTMENT_OTHER)
Admission: RE | Admit: 2024-01-17 | Discharge: 2024-01-17 | Disposition: A | Discharge status: Home / Self Care | Source: Ambulatory Visit | Attending: Family Medicine | Admitting: Family Medicine

## 2024-01-17 DIAGNOSIS — R634 Abnormal weight loss: Secondary | ICD-10-CM | POA: Insufficient documentation

## 2024-01-17 LAB — LIPOPROTEIN A (LPA): Lipoprotein (a): 29 nmol/L (ref <75)

## 2024-01-17 LAB — IRON,TIBC AND FERRITIN PANEL
%SAT: 25 % (ref 16–45)
Ferritin: 27 ng/mL (ref 16–232)
Iron: 74 ug/dL (ref 40–190)
TIBC: 291 ug/dL (ref 250–450)

## 2024-01-29 ENCOUNTER — Ambulatory Visit: Admitting: Family Medicine

## 2024-01-29 ENCOUNTER — Ambulatory Visit: Payer: Self-pay

## 2024-01-29 ENCOUNTER — Encounter: Payer: Self-pay | Admitting: Family Medicine

## 2024-01-29 VITALS — BP 120/94 | HR 87 | Temp 98.4°F | Ht 64.0 in | Wt 122.2 lb

## 2024-01-29 DIAGNOSIS — R634 Abnormal weight loss: Secondary | ICD-10-CM

## 2024-01-29 DIAGNOSIS — R42 Dizziness and giddiness: Secondary | ICD-10-CM

## 2024-01-29 DIAGNOSIS — R55 Syncope and collapse: Secondary | ICD-10-CM | POA: Diagnosis not present

## 2024-01-29 NOTE — Telephone Encounter (Signed)
 Copied from CRM [id number]. Topic: Clinical - Red Word Triage >> Jan 29, 2024 11:44 AM Alyse July wrote: Red Word that prompted transfer to Nurse Triage: Weight loss, passed out yesterday , dizziness upon standing   Chief Complaint: Dizziness Symptoms: Dizziness, Nausea  Frequency: Acute  Pertinent Negatives: Patient denies chest pain, dyspnea, vomiting  Disposition: [] ED /[] Urgent Care (no appt availability in office) / [x] Appointment(In office/virtual)/ []  [facility]/ [] Home Care/ [] Refused Recommended Disposition /[] [facility]/ []  Follow-up with PCP Additional Notes: HY is being triaged for dizziness and weight loss, 20 lbs in two months. Dizziness Is worsened by standing. Reports having passed out yesterday after walking the dogs yesterday. In office appointment made for today.   Reason for Disposition  [1] MODERATE dizziness (e.g., interferes with normal activities) AND [2] has NOT been evaluated by doctor (or NP/PA) for this  (Exception: Dizziness caused by heat exposure, sudden standing, or poor fluid intake.)  Answer Assessment - Initial Assessment Questions 1. DESCRIPTION: "Describe your dizziness."     Weak feeling  2. LIGHTHEADED: "Do you feel lightheaded?" (e.g., somewhat faint, woozy, weak upon standing)     Faint, Woozy, Weak upon standing  3. VERTIGO: "Do you feel like either you or the room is spinning or tilting?" (i.e. vertigo)     Spinning sensation  4. SEVERITY: "How bad is it?"  "Do you feel like you are going to faint?" "Can you stand and walk?"   - MILD: Feels slightly dizzy, but walking normally.   - MODERATE: Feels unsteady when walking, but not falling; interferes with normal activities (e.g., school, work).   - SEVERE: Unable to walk without falling, or requires assistance to walk without falling; feels like passing out now.      Moderate to Severe  5. ONSET:  "When did the dizziness begin?"     Acute, saw Dr. Darren Em some weeks  ago  6. AGGRAVATING FACTORS: "Does anything make it worse?" (e.g., standing, change in head position)     Standing  7. HEART RATE: "Can you tell me your heart rate?" "How many beats in 15 seconds?"  (Note: not all patients can do this)       90s  8. CAUSE: "What do you think is causing the dizziness?"     Unsure  9. RECURRENT SYMPTOM: "Have you had dizziness before?" If Yes, ask: "When was the last time?" "What happened that time?"     No  10. OTHER SYMPTOMS: "Do you have any other symptoms?" (e.g., fever, chest pain, vomiting, diarrhea, bleeding)       Nausea  11. PREGNANCY: "Is there any chance you are pregnant?" "When was your last menstrual period?"       No, Hysterectomy  Protocols used: Dizziness - Lightheadedness-A-AH

## 2024-01-29 NOTE — Progress Notes (Signed)
 Established Patient Office Visit   Subjective  Patient ID: Catherine Washington, female    DOB: 27-May-1976  Age: 48 y.o. MRN: [id number]  Chief Complaint  Patient presents with   Dizziness    Dizziness started 2 weeks ago, when standing up, weight loss, and patient passed out yesterday BP was 136/88 p 90 and the time patient said her heart wad pounding, patient is eating 1 meal a day, and 2 protein shakes     Patient is a 48 year old female followed by Dr. Darren Em and seen for acute concern.  Patient endorses ongoing intermittent dizziness with standing up for 2 weeks or more.  States yesterday after walking her dog she attempted to sit down but vision went black and she passed out.  Also endorses increased nausea if he eats at night.  Has a history of gastric bypass over 20 years ago. Typically has a protein shake in the morning, baked apple croissant and aai lemonade for lunch, and a protein shake for dinner. States has been eating like this since bypass.  Drinking 72 ounces of water .  Taking a multivitamin daily.  Also added vitamin B12 as recent testing was lower.  Patient also mentions continued weight loss.  Denies changes in medications, palpitations, CP.  Patient states everyone around her was concerned about symptoms and suggest that she come to appointment but she was not as concerned.    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   Abdominal pain, acute, epigastric and RUQ 06/23/2012   Recurrent depression (HCC) 04/18/2011   Past Medical History:  Diagnosis Date   Allergy    Anemia    Anxiety    Chicken  pox    Depression    Family history of adverse reaction to anesthesia    mother gets very nausaus   Family history of breast cancer    Family history of breast cancer    Family history of kidney cancer    Family history of melanoma    Family history of ovarian cancer    Family history of thyroid cancer    Headache    occasionally   Hyperlipidemia    Ulcerative colitis (HCC)    UTI (urinary tract infection)    Past Surgical History:  Procedure Laterality Date   ABDOMINAL HYSTERECTOMY  09/17/2004   dysfunctional bleeding   BREATH TEK H PYLORI N/A 12/06/2014   Procedure: BREATH TEK H PYLORI;  Surgeon: Aldean Hummingbird, MD;  Location: [facility];  Service: General;  Laterality: N/A;   childbirth  1995, 2003   CHOLECYSTECTOMY     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   Procedure: LAPAROSCOPIC ROUX-EN-Y GASTRIC BYPASS WITH UPPER ENDOSCOPY;  Surgeon: Aldean Hummingbird, MD;  Location: [facility];  Service: General;  Laterality: N/A;   SMALL INTESTINE SURGERY     Social History   Tobacco Use   Smoking status: Never   Smokeless tobacco: Never  Vaping Use   Vaping status: Never Used  Substance Use Topics   Alcohol use: No   Drug use: No   Family History  Problem Relation Age of Onset   Hyperlipidemia  Mother    Mental illness Mother    Diabetes Mother        type 2 diabetes   Depression Mother    Obesity Mother    Hyperlipidemia Father    Heart disease Father 86       MI   Hypertension Father    Diabetes Father    Thyroid cancer Father 67   Melanoma Father    Alcohol abuse Father    Cancer Father    COPD Father    Diabetes Maternal Grandmother    Kidney cancer Maternal Grandmother    Melanoma Maternal Grandmother    Cancer Maternal Grandmother    Miscarriages / Stillbirths Maternal Grandmother    Ovarian cancer Paternal Grandmother        dx late 74s   Stroke Paternal Grandmother    Lung cancer Paternal Grandmother        dx 30s, hx smoking   Cancer Paternal Grandmother     Diabetes Paternal Grandfather    Heart disease Paternal Grandfather    Stroke Paternal Grandfather    Breast cancer Maternal Aunt        diagonsed x2, first in 75s   Cancer Maternal Aunt 48       metastatic; lung, liver, panc, spleen   Colon cancer Neg Hx    Rectal cancer Neg Hx    Esophageal cancer Neg Hx    Allergies  Allergen Reactions   Nitrofurantoin Monohyd Macro Nausea And Vomiting and Other (See Comments)    VIOLENT VOMITING   Tape Other (See Comments)    Blisters    ROS Negative unless stated above    Objective:      BP (!) 120/94 (BP Location: Left Arm, Patient Position: Sitting, Cuff Size: Normal)   Pulse 87   Temp 98.4 F (36.9 C) (Oral)   Ht 5\' 4"  (1.626 m)   Wt 122 lb 3.2 oz (55.4 kg)   SpO2 100%   BMI 20.98 kg/m  BP Readings from Last 3 Encounters:  01/29/24 (!) 120/94  01/14/24 120/82  11/04/23 112/75   Wt Readings from Last 3 Encounters:  01/29/24 122 lb 3.2 oz (55.4 kg)  01/14/24 123 lb 8 oz (56 kg)  11/04/23 148 lb 12.8 oz (67.5 kg)      Physical Exam Constitutional:      General: She is not in acute distress.    Appearance: Normal appearance.  HENT:     Head: Normocephalic and atraumatic.     Nose: Nose normal.     Mouth/Throat:     Mouth: Mucous membranes are moist.  Cardiovascular:     Rate and Rhythm: Normal rate and regular rhythm.     Heart sounds: Normal heart sounds. No murmur heard.    No gallop.  Pulmonary:     Effort: Pulmonary effort is normal. No respiratory distress.     Breath sounds: Normal breath sounds. No wheezing, rhonchi or rales.  Skin:    General: Skin is warm and dry.  Neurological:     Mental Status: She is alert and oriented to person, place, and time.  Psychiatric:     Comments: Somewhat cooperative.  Initially providing little to no detail regarding symptoms.        01/14/2024    8:38 AM 09/04/2022   10:15 AM 07/16/2022    2:27 PM  Depression screen PHQ 2/9  Decreased Interest 2 2 1   Down,  Depressed, Hopeless 2 1 1   PHQ - 2  Score 4 3 2   Altered sleeping 3 1 1   Tired, decreased energy 3 2 2   Change in appetite 3 2 1   Feeling bad or failure about yourself  0 0 0  Trouble concentrating 0 1 0  Moving slowly or fidgety/restless 0 0 0  Suicidal thoughts 0 0 0  PHQ-9 Score 13 9 6   Difficult doing work/chores  Somewhat difficult Somewhat difficult      01/14/2024    8:38 AM  GAD 7 : Generalized Anxiety Score  Nervous, Anxious, on Edge 2  Control/stop worrying 2  Worry too much - different things 0  Trouble relaxing 0  Restless 0  Easily annoyed or irritable 2  Afraid - awful might happen 0  Total GAD 7 Score 6  Anxiety Difficulty Very difficult     No results found for any visits on 01/29/24.    Assessment & Plan:   Dizziness  Syncope, unspecified syncope type  Weight loss, unintentional  Patient seen for acute syncopal episode.  Orthostatics obtained in clinic and negative.  Discussed possible causes of symptoms including vitamin deficiency, thyroid dysfunction, electrolyte abnormality, medication, carotid artery stenosis, arrhythmia.  Recent labs from 01/14/2024 reviewed.  B12 was low normal at 326.  Patient encouraged to continue taking vitamin B 12 supplement.  TSH normal.  CBC largely normal.  Recent CT abdomen pelvis negative.  Given precautions.  Continue follow-up with PCP for further workup.  Return if symptoms worsen or fail to improve.   Viola Greulich, MD

## 2024-01-31 NOTE — Telephone Encounter (Signed)
 Spoke with Catherine Washington.  She has no new symptoms.  Did have some severe dizziness couple days ago but not currently.  She was seen in office by Dr. Arliss Lam 2 days ago.  She has scheduled follow-up with her GYN for early June although her recent CT abdomen pelvis did not show any adnexal masses.  She is concerned because she had a grandmother with ovarian cancer.  She will be in touch if she develops any new symptoms.  Denies current headache, dyspnea, cough.  We did mention the fact she is on 2 medications (Wellbutrin  and Topamax ) that can be associated with weight loss but at this point she is reluctant to stop Topamax  with her history of severe migraines.  Marquetta Sit MD [facility]

## 2024-02-12 ENCOUNTER — Ambulatory Visit: Admitting: Neurology

## 2024-02-12 VITALS — BP 124/74 | HR 93 | Ht 64.0 in | Wt 119.5 lb

## 2024-02-12 DIAGNOSIS — G43709 Chronic migraine without aura, not intractable, without status migrainosus: Secondary | ICD-10-CM | POA: Insufficient documentation

## 2024-02-12 DIAGNOSIS — G5603 Carpal tunnel syndrome, bilateral upper limbs: Secondary | ICD-10-CM | POA: Insufficient documentation

## 2024-02-12 DIAGNOSIS — R202 Paresthesia of skin: Secondary | ICD-10-CM

## 2024-02-12 DIAGNOSIS — R200 Anesthesia of skin: Secondary | ICD-10-CM | POA: Diagnosis not present

## 2024-02-12 MED ORDER — TOPIRAMATE 100 MG PO TABS: 100.0000 mg | ORAL_TABLET | Freq: Every day | ORAL | 3 refills | Status: AC

## 2024-02-12 NOTE — Progress Notes (Signed)
 Chief Complaint  Patient presents with   NERVE CONDUCTION STUDY    Emgrm3, alone, NERVE CONDUCTION STUDY:pt is well and ready for study       ASSESSMENT AND PLAN  AMYRIAH BURAS is a 48 y.o. female   Chronic migraine headache  Well-controlled with Topamax  100 mg and Effexor  XR 37.5 mg as preventive medication,  Naratriptan  as needed works well,  Advised patient if she continue to do well, may consider taper of Topamax  50 mg every night for couple weeks, then stop, refilled her prescription today  Right hand paresthesia  EMG nerve study today demonstrate moderate carpal tunnel syndrome, demyelinating in nature, would benefit conservative treatment, suggested wrist splint, as needed NSAIDs, diclofenac gel  Will continue follow-up with primary care only return to clinic for new issues  DIAGNOSTIC DATA (LABS, IMAGING, TESTING) - I reviewed patient records, labs, notes, testing and imaging myself where available.   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 I personally reviewed pertinent available imaging films in PACS.   PMHx of  Depression History of gastric bypass Ulcerative colitis Chronic migraine  She has a long history of chronic migraine, doing much better since started on Topamax  every night as preventive medications, using naratriptan  2.5 mg as needed, works well, but average less than 1 migraine in 1 month  She is also on Effexor  37.5 mg daily, Wellbutrin ,  Over the past few months, she noticed right hand paresthesia, mainly involving the right 3rd and 4th fingers, constant numbness, no weakness, neck tension, but no radiating pain, denied significant left hand involvement, no bilateral lower extremity  paresthesia   PHYSICAL EXAM:   Vitals:   02/12/24 0920  BP: 124/74  Pulse: 93  Weight: 119 lb 8 oz (54.2 kg)  Height: 5\' 4"  (1.626 m)     Body mass index is 20.51 kg/m.  PHYSICAL EXAMNIATION:  Gen: NAD, conversant, well nourised, well groomed                     Cardiovascular: Regular rate rhythm, no peripheral edema, warm, nontender. Eyes: Conjunctivae clear without exudates or hemorrhage Neck: Supple, no carotid bruits. Pulmonary: Clear to auscultation bilaterally   NEUROLOGICAL EXAM:  MENTAL STATUS: Speech/cognition: Awake, alert, oriented to history taking and casual conversation CRANIAL NERVES: CN II: Visual fields are full to confrontation. Pupils are round equal and briskly reactive to light. CN III, IV, VI: extraocular movement are normal. No ptosis. CN V: Facial sensation is intact to light touch CN VII: Face is symmetric with normal eye closure  CN VIII: Hearing is normal to causal conversation. CN IX, X: Phonation is normal. CN XI: Head turning and shoulder shrug are intact  MOTOR: There is no pronator drift of out-stretched arms. Muscle bulk and tone are normal. Muscle strength is normal.  REFLEXES: Reflexes are 2+ and symmetric at the biceps, triceps, knees, and ankles. Plantar responses are flexor.  SENSORY: Mildly decreased to pinprick at bilateral finger pads involving lateral 3 fingers  COORDINATION: There is no trunk or limb dysmetria noted.  GAIT/STANCE: Posture is normal. Gait is steady    REVIEW OF SYSTEMS:  Full 14 system review of systems performed and notable only for as above All other review  of systems were negative.   ALLERGIES: Allergies  Allergen Reactions   Nitrofurantoin Monohyd Macro Nausea And Vomiting and Other (See Comments)    VIOLENT VOMITING   Tape Other (See Comments)    Blisters    HOME MEDICATIONS: Current Outpatient Medications  Medication Sig Dispense Refill   Acetaminophen  (TYLENOL  PO) Take by mouth.      buPROPion  (WELLBUTRIN ) 100 MG tablet Take 1 tablet (100 mg total) by mouth 3 (three) times daily. 90 tablet 3   fexofenadine (ALLEGRA) 180 MG tablet Take 180 mg by mouth daily.     fluticasone (FLONASE) 50 MCG/ACT nasal spray Place 1-2 sprays into both nostrils daily as needed (for seasonal allergies).     Multiple Vitamin (MULTIVITAMIN) tablet Take 1 tablet by mouth daily with breakfast.     naratriptan  (AMERGE) 2.5 MG tablet Take 1 tablet (2.5 mg total) by mouth as needed for migraine. Take one (1) tablet at onset of headache; if returns or does not resolve, may repeat after 4 hours; do not exceed five (5) mg in 24 hours. 10 tablet 11   topiramate  (TOPAMAX ) 100 MG tablet Take 1 tablet (100 mg total) by mouth at bedtime. 90 tablet 3   venlafaxine  XR (EFFEXOR -XR) 37.5 MG 24 hr capsule Take 1 capsule (37.5 mg total) by mouth daily with breakfast. 90 capsule 3   No current facility-administered medications for this visit.    PAST MEDICAL HISTORY: Past Medical History:  Diagnosis Date   Allergy    Anemia    Anxiety    Chicken pox    Depression    Family history of adverse reaction to anesthesia    mother gets very nausaus   Family history of breast cancer    Family history of breast cancer    Family history of kidney cancer    Family history of melanoma    Family history of ovarian cancer    Family history of thyroid cancer    Headache    occasionally   Hyperlipidemia    Ulcerative colitis (HCC)    UTI (urinary tract infection)     PAST SURGICAL HISTORY: Past Surgical History:  Procedure Laterality Date   ABDOMINAL HYSTERECTOMY  09/17/2004   dysfunctional bleeding   BREATH TEK H PYLORI N/A 12/06/2014   Procedure: BREATH TEK H PYLORI;  Surgeon: Aldean Hummingbird, MD;  Location: [facility];  Service: General;  Laterality: N/A;   childbirth  1995, 2003   CHOLECYSTECTOMY     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   Procedure: LAPAROSCOPIC ROUX-EN-Y GASTRIC BYPASS WITH UPPER ENDOSCOPY;   Surgeon: Aldean Hummingbird, MD;  Location: [facility];  Service: General;  Laterality: N/A;   SMALL INTESTINE SURGERY      FAMILY HISTORY: Family History  Problem Relation Age of Onset   Hyperlipidemia Mother    Mental illness Mother    Diabetes Mother        type 2 diabetes   Depression Mother    Obesity Mother    Hyperlipidemia Father    Heart disease Father 52       MI   Hypertension Father    Diabetes Father    Thyroid cancer Father 78   Melanoma Father    Alcohol abuse Father    Cancer Father    COPD Father    Diabetes Maternal Grandmother    Kidney cancer Maternal Grandmother    Melanoma Maternal Grandmother    Cancer Maternal Grandmother    Miscarriages / Stillbirths Maternal Grandmother  Ovarian cancer Paternal Grandmother        dx late 75s   Stroke Paternal Grandmother    Lung cancer Paternal Grandmother        dx 30s, hx smoking   Cancer Paternal Grandmother    Diabetes Paternal Grandfather    Heart disease Paternal Grandfather    Stroke Paternal Grandfather    Breast cancer Maternal Aunt        diagonsed x2, first in 64s   Cancer Maternal Aunt 36       metastatic; lung, liver, panc, spleen   Colon cancer Neg Hx    Rectal cancer Neg Hx    Esophageal cancer Neg Hx     SOCIAL HISTORY: Social History   Socioeconomic History   Marital status: Married    Spouse name: Not on file   Number of children: 2   Years of education: Not on file   Highest education level: Bachelor's degree (e.g., BA, AB, BS)  Occupational History   Not on file  Tobacco Use   Smoking status: Never   Smokeless tobacco: Never  Vaping Use   Vaping status: Never Used  Substance and Sexual Activity   Alcohol use: No   Drug use: No   Sexual activity: Yes    Birth control/protection: None  Other Topics Concern   Not on file  Social History Narrative   Has 2 children  10 and 15 non smoker    Social Drivers of Corporate investment banker Strain: Low Risk  (01/13/2024)   Overall  Financial Resource Strain (CARDIA)    Difficulty of Paying Living Expenses: Not hard at all  Food Insecurity: No Food Insecurity (01/13/2024)   Hunger Vital Sign    Worried About Running Out of Food in the Last Year: Never true    Ran Out of Food in the Last Year: Never true  Transportation Needs: No Transportation Needs (01/13/2024)   PRAPARE - Administrator, Civil Service (Medical): No    Lack of Transportation (Non-Medical): No  Physical Activity: Insufficiently Active (01/13/2024)   Exercise Vital Sign    Days of Exercise per Week: 7 days    Minutes of Exercise per Session: 20 min  Stress: Stress Concern Present (01/13/2024)   [employer] of Occupational Health - Occupational Stress Questionnaire    Feeling of Stress : Very much  Social Connections: Moderately Integrated (01/13/2024)   Social Connection and Isolation Panel [NHANES]    Frequency of Communication with Friends and Family: Once a week    Frequency of Social Gatherings with Friends and Family: More than three times a week    Attends Religious Services: 1 to 4 times per year    Active Member of [employer] or Organizations: No    Attends Banker Meetings: Not on file    Marital Status: Married  Catering manager Violence: Not At Risk (07/11/2022)   Humiliation, Afraid, Rape, and Kick questionnaire    Fear of Current or Ex-Partner: No    Emotionally Abused: No    Physically Abused: No    Sexually Abused: No      Phebe Brasil, M.D. Ph.D.  [facility] [address], [address] [place], [place] [address] Ph: [phone] Fax: [phone]  CC:  Phebe Brasil, MD [address] [place],  [place] [address]  Darren Em, Marijean Shouts, MD

## 2024-02-12 NOTE — Procedures (Signed)
 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  Bilateral median sensory response showed moderately prolonged peak latency right worse than left, right side also has mildly decreased snap amplitude  Bilateral median motor responses showed mild to moderately prolonged distal latency with well-preserved CMAP amplitude  Electromyography: Selected needle examinations of right upper extremity muscles, left abductor pollicis brevis were performed.  The only abnormality is mildly decreased recruitment patterns at bilateral abductor pollicis brevis, there was no spontaneous activity.  Conclusion: This is an abnormal study.  There is electrodiagnostic evidence of bilateral distal median neuropathy across the wrist, consistent with moderate bilateral carpal tunnel syndromes, demyelinating in nature, there was no evidence of axonal loss.    ------------------------------- Phebe Brasil. M.D. Ph.D.   [facility] [address] [place], [place] [address] Tel: [phone] Fax: [phone]  Verbal informed consent was obtained from the patient, patient was informed of potential risk of procedure, including bruising, bleeding, hematoma formation, infection, muscle weakness, muscle pain, numbness, among others.        MNC    Nerve / Sites Muscle Latency Ref. Amplitude Ref. Rel Amp Segments Distance Velocity Ref. Area    ms ms mV mV %  cm m/s m/s mVms  R Median - APB     Wrist APB 5.4 <=4.4 6.6 >=4.0 100 Wrist - APB 7   31.8     Upper arm APB 9.0   7.0  107 Upper arm - Wrist 20 56 >=49 34.1  L Median - APB     Wrist APB 5.0 <=4.4 5.8 >=4.0 100 Wrist - APB 7   28.8     Upper arm APB 8.7  5.8  101 Upper arm - Wrist 22 60 >=49 29.1  R Ulnar - ADM     Wrist ADM 3.1 <=3.3 9.0 >=6.0 100 Wrist - ADM 7   38.7     B.Elbow ADM 5.2  9.7  107 B.Elbow - Wrist 13 61 >=49 44.0     A.Elbow ADM 7.6  10.3  106 A.Elbow - B.Elbow 13 54 >=49 44.8  L Ulnar - ADM     Wrist ADM 2.8 <=3.3 10.7 >=6.0 100 Wrist - ADM 7   40.2     B.Elbow ADM 5.0  9.8  91.5 B.Elbow - Wrist 14 62 >=49 37.1     A.Elbow ADM 7.5  9.8  100 A.Elbow - B.Elbow 15 61 >=49 37.8             SNC    Nerve / Sites Rec. Site Peak Lat Ref.  Amp Ref. Segments Distance    ms ms V V  cm  R Median - Orthodromic (Dig II, Mid palm)     Dig II Wrist 4.7 <=3.4 8 >=10 Dig II - Wrist 13  L Median - Orthodromic (Dig II, Mid palm)     Dig II Wrist 4.0 <=3.4 15 >=10 Dig II - Wrist 13  R Ulnar - Orthodromic, (Dig V, Mid palm)     Dig V Wrist 2.7 <=3.1 19 >=5 Dig V - Wrist  11  L Ulnar - Orthodromic, (Dig V, Mid palm)     Dig V Wrist 2.8 <=3.1 19 >=5 Dig V - Wrist 29             F  Wave    Nerve F Lat Ref.   ms ms  R Ulnar - ADM 27.8 <=32.0  L Ulnar - ADM 25.9 <=32.0         EMG Summary Table    Spontaneous MUAP Recruitment  Muscle IA Fib PSW Fasc Other Amp Dur. Poly Pattern  R. Abductor pollicis brevis Normal None None None _______ Normal Normal Normal Reduced  R. First dorsal interosseous Normal None None None _______ Normal Normal Normal Normal  R. Triceps brachii Normal None None None _______ Normal Normal Normal Normal  R. Deltoid Normal None None None _______ Normal Normal Normal Normal  R. Biceps brachii Normal None None None _______ Normal Normal Normal Normal  R. Extensor digitorum communis Normal None None None _______ Normal Normal Normal Normal  L. Abductor pollicis brevis Normal None None None _______ Normal Normal Normal Reduced

## 2024-05-16 ENCOUNTER — Other Ambulatory Visit: Payer: Self-pay | Admitting: Family Medicine

## 2024-05-16 DIAGNOSIS — F339 Major depressive disorder, recurrent, unspecified: Secondary | ICD-10-CM

## 2024-08-26 NOTE — Progress Notes (Signed)
 Update: RAD51C [id number] VUS reclassified to likely benign. Report date is 08/17/2024.

## 2024-09-03 ENCOUNTER — Other Ambulatory Visit: Payer: Self-pay | Admitting: Family Medicine

## 2024-09-03 DIAGNOSIS — F339 Major depressive disorder, recurrent, unspecified: Secondary | ICD-10-CM

## 2024-09-03 MED ORDER — BUPROPION HCL 100 MG PO TABS: 100.0000 mg | ORAL_TABLET | Freq: Three times a day (TID) | ORAL | 0 refills | Status: DC

## 2024-09-03 NOTE — Telephone Encounter (Signed)
 Copied from CRM #[id number]. Topic: Clinical - Medication Refill >> Sep 03, 2024 11:21 AM Roselie BROCKS wrote: Medication: buPROPion  (WELLBUTRIN ) 100 MG tablet  Has the patient contacted their pharmacy? Yes (Agent: If no, request that the patient contact the pharmacy for the refill. If patient does not wish to contact the pharmacy document the reason why and proceed with request.) (Agent: If yes, when and what did the pharmacy advise?)  This is the patient's preferred pharmacy:  [id number] [place], [place] - [address] [place] [address] Phone: [phone] Fax: [phone]  Is this the correct pharmacy for this prescription? Yes If no, delete pharmacy and type the correct one.   Has the prescription been filled recently? No  Is the patient out of the medication? Yes  Has the patient been seen for an appointment in the last year OR does the patient have an upcoming appointment? Yes  Can we respond through MyChart? Yes  Agent: Please be advised that Rx refills may take up to 3 business days. We ask that you follow-up with your pharmacy.

## 2024-09-08 ENCOUNTER — Telehealth: Admitting: Physician Assistant

## 2024-09-08 DIAGNOSIS — R3989 Other symptoms and signs involving the genitourinary system: Secondary | ICD-10-CM | POA: Diagnosis not present

## 2024-09-08 MED ORDER — CEPHALEXIN 500 MG PO CAPS: 500.0000 mg | ORAL_CAPSULE | Freq: Two times a day (BID) | ORAL | 0 refills | Status: AC

## 2024-09-08 NOTE — Progress Notes (Signed)

## 2024-10-02 ENCOUNTER — Other Ambulatory Visit: Payer: Self-pay | Admitting: Family Medicine

## 2024-10-02 DIAGNOSIS — F339 Major depressive disorder, recurrent, unspecified: Secondary | ICD-10-CM

## 2024-11-03 ENCOUNTER — Ambulatory Visit: Payer: 59 | Admitting: Adult Health
